# Patient Record
Sex: Male | Born: 1945 | Race: White | Hispanic: No | Marital: Married | Smoking: Never smoker
Health system: Southern US, Community
[De-identification: ages and names within clinical notes are randomized; demographics above are authoritative.]

## PROBLEM LIST (undated history)

## (undated) DIAGNOSIS — L570 Actinic keratosis: Secondary | ICD-10-CM

## (undated) DIAGNOSIS — E785 Hyperlipidemia, unspecified: Secondary | ICD-10-CM

## (undated) DIAGNOSIS — I219 Acute myocardial infarction, unspecified: Secondary | ICD-10-CM

## (undated) DIAGNOSIS — N400 Enlarged prostate without lower urinary tract symptoms: Secondary | ICD-10-CM

## (undated) DIAGNOSIS — G4730 Sleep apnea, unspecified: Secondary | ICD-10-CM

## (undated) DIAGNOSIS — L309 Dermatitis, unspecified: Secondary | ICD-10-CM

## (undated) DIAGNOSIS — I4891 Unspecified atrial fibrillation: Secondary | ICD-10-CM

## (undated) DIAGNOSIS — K429 Umbilical hernia without obstruction or gangrene: Secondary | ICD-10-CM

## (undated) DIAGNOSIS — I2692 Saddle embolus of pulmonary artery without acute cor pulmonale: Secondary | ICD-10-CM

## (undated) DIAGNOSIS — I2510 Atherosclerotic heart disease of native coronary artery without angina pectoris: Secondary | ICD-10-CM

## (undated) HISTORY — DX: Actinic keratosis: L57.0

## (undated) HISTORY — PX: APPENDECTOMY: SHX54

## (undated) HISTORY — PX: CHOLECYSTECTOMY: SHX55

## (undated) HISTORY — DX: Sleep apnea, unspecified: G47.30

## (undated) HISTORY — DX: Atherosclerotic heart disease of native coronary artery without angina pectoris: I25.10

## (undated) HISTORY — DX: Hyperlipidemia, unspecified: E78.5

---

## 2000-07-18 HISTORY — PX: CORONARY ARTERY BYPASS GRAFT: SHX141

## 2005-06-13 HISTORY — PX: CARDIAC CATHETERIZATION: SHX172

## 2009-05-27 ENCOUNTER — Ambulatory Visit (HOSPITAL_COMMUNITY): Admission: RE | Admit: 2009-05-27 | Discharge: 2009-05-27 | Payer: Self-pay | Admitting: Family Medicine

## 2009-11-28 ENCOUNTER — Emergency Department (HOSPITAL_COMMUNITY): Admission: EM | Admit: 2009-11-28 | Discharge: 2009-11-29 | Payer: Self-pay | Admitting: Emergency Medicine

## 2010-03-16 HISTORY — PX: NM MYOVIEW LTD: HXRAD82

## 2010-03-16 HISTORY — PX: DOPPLER ECHOCARDIOGRAPHY: SHX263

## 2010-04-12 ENCOUNTER — Ambulatory Visit: Payer: Self-pay | Admitting: Internal Medicine

## 2010-05-31 ENCOUNTER — Ambulatory Visit: Payer: Self-pay | Admitting: Internal Medicine

## 2011-01-28 ENCOUNTER — Ambulatory Visit (INDEPENDENT_AMBULATORY_CARE_PROVIDER_SITE_OTHER): Payer: BC Managed Care – PPO | Admitting: Internal Medicine

## 2011-01-28 ENCOUNTER — Encounter: Payer: Self-pay | Admitting: Internal Medicine

## 2011-01-28 DIAGNOSIS — H6592 Unspecified nonsuppurative otitis media, left ear: Secondary | ICD-10-CM

## 2011-01-28 DIAGNOSIS — I2510 Atherosclerotic heart disease of native coronary artery without angina pectoris: Secondary | ICD-10-CM

## 2011-01-28 DIAGNOSIS — I10 Essential (primary) hypertension: Secondary | ICD-10-CM

## 2011-01-28 DIAGNOSIS — G4730 Sleep apnea, unspecified: Secondary | ICD-10-CM

## 2011-01-28 DIAGNOSIS — E785 Hyperlipidemia, unspecified: Secondary | ICD-10-CM

## 2011-01-28 DIAGNOSIS — J309 Allergic rhinitis, unspecified: Secondary | ICD-10-CM

## 2011-01-28 DIAGNOSIS — H6590 Unspecified nonsuppurative otitis media, unspecified ear: Secondary | ICD-10-CM

## 2011-01-28 NOTE — Progress Notes (Signed)
  Subjective:    Patient ID: Keith Phelps, male    DOB: [birth year], 65 y.o.   MRN: [id number]  HPI    Pleasant white male with history of coronary artery disease status post coronary artery bypass graft surgery X4 2002 followed by [facility]. History of hyperlipidemia hypertension and ischemic cardiomyopathy. Just saw cardiologist 01/19/2011 and was felt to be stable. History of sleep apnea and uses CPAP machine. His fiance is [name]. He is divorced. He is a retired [employer] fund[employer]. He retired in May 2011. Does not smoke or consume alcohol. Family history of stroke and MI in his father. History of cholecystectomy and appendectomy 1976. History of BPH for which he uses Flomax. Says he has developed some allergy symptoms since moving to [place]. Has chronic stuffy nose. Has used steroid nasal spray in the past but did not think it helped all that much.  Patient here today complaining of left ear discomfort onset yesterday. Says he has slight discomfort in left roof of mouth when swallowing with pain shooting up to his right ear.     Review of Systems     Objective:   Physical Exam has fullness in left TM. It does not red. Pharynx is clear. Neck is supple; chest is clear; he has boggy nasal mucosa; has cerumen right external ear canal. His voice is slightly raspy and has been that way for some time he says.        Assessment & Plan:  Left serous otitis media  Allergic rhinitis  Hypertension  Hyperlipidemia  Coronary artery disease  Sleep apnea  Plan he may take Sudafed PE for 7 days. Zithromax Z-PAK 2 tabs by mouth day one followed by 1 tab days 2 through 5. May need to take Zyrtec on a daily basis for allergy symptoms. He would like to see ENT physician to have his vocal cords checked and have his hearing checked. I will also need to remove cerumen from his right ear. Appointment made Dr. Haroldine Laws Friday, 02/18/2011.

## 2011-01-28 NOTE — Patient Instructions (Signed)
Takes Sudafed PE for 7 days then switch to Zyrtec 10 mg at bedtime. Zithromax Z-PAK as been prescribed for otitis media appointment made for you to see ENT physician 02/18/2011

## 2011-05-02 ENCOUNTER — Telehealth: Payer: Self-pay

## 2011-05-02 NOTE — Telephone Encounter (Signed)
Pt should contact provider of his C-Pap equipment for an assessment. Perhaps [facility] provided it?

## 2011-05-06 ENCOUNTER — Telehealth: Payer: Self-pay

## 2011-05-06 NOTE — Telephone Encounter (Signed)
New order sent to [facility] for Auto CPap Machine for 90 day trial period,  Signed by Dr. Lenord Fellers. Patient informed

## 2011-05-20 ENCOUNTER — Telehealth: Payer: Self-pay

## 2011-05-20 NOTE — Telephone Encounter (Signed)
Order faxed to [facility] for CPap setting of 10 cm of H20, per auto titration report

## 2011-07-04 ENCOUNTER — Encounter: Payer: Self-pay | Admitting: Internal Medicine

## 2011-08-23 DIAGNOSIS — G4730 Sleep apnea, unspecified: Secondary | ICD-10-CM

## 2012-01-03 ENCOUNTER — Ambulatory Visit (INDEPENDENT_AMBULATORY_CARE_PROVIDER_SITE_OTHER): Payer: Medicare Other | Admitting: Internal Medicine

## 2012-01-03 ENCOUNTER — Encounter: Payer: Self-pay | Admitting: Internal Medicine

## 2012-01-03 VITALS — BP 126/64 | HR 64 | Temp 98.4°F | Ht 71.0 in | Wt 201.0 lb

## 2012-01-03 DIAGNOSIS — I2510 Atherosclerotic heart disease of native coronary artery without angina pectoris: Secondary | ICD-10-CM

## 2012-01-03 DIAGNOSIS — E785 Hyperlipidemia, unspecified: Secondary | ICD-10-CM

## 2012-01-03 DIAGNOSIS — G4730 Sleep apnea, unspecified: Secondary | ICD-10-CM

## 2012-01-03 DIAGNOSIS — R972 Elevated prostate specific antigen [PSA]: Secondary | ICD-10-CM

## 2012-01-03 DIAGNOSIS — I10 Essential (primary) hypertension: Secondary | ICD-10-CM

## 2012-01-03 NOTE — Patient Instructions (Addendum)
Continue same medications as well as CPAP machine at 10 cm of water. Return in one year.

## 2012-01-03 NOTE — Progress Notes (Signed)
  Subjective:    Patient ID: Keith Phelps, male    DOB: 01/08/1946, 66 y.o.   MRN: [id number]  HPI 66 year old white male seen here infrequently. He has a history of sleep apnea and had sleep study done in [place] in 2010. He recently brought that report in. He uses 10 cm of water CPAP. He has been dealing with [facility] in [place] and even but wants to change to [facility] here in [place]. He is now living here in [place] with his fiance Keith Phelps. He is also under the care of urologist for elevated PSA and cardiologist as well. Does say that Dr. Rennis Golden, cardiologist checks lab work on him frequently. He declines to get influenza immunizations. He is not overweight. History of hypertension. History of coronary artery bypass surgery x4 in 2002. Cholecystectomy and appendectomy 1976. No known drug allergies. Is on Lipitor for hyperlipidemia. Takes Flomax daily. Also is on Trilipix and ramipril as well as Niaspan.. Had colonoscopy July 2010 by Dr. Tish Men in [place] showing internal hemorrhoids. Repeat colonoscopy recommended in 10 years. No polyps noted. Declines influenza immunization.    Review of Systems     Objective:   Physical Exam he is alert and oriented x3. Pulse oximetry on room air is 95%. Weight is stable. HEENT exam: TMs and pharynx are clear. Neck is supple without JVD thyromegaly or carotid bruits. Chest is clear to auscultation. Cardiac exam regular rate and rhythm normal S1 and S2 without murmurs gallops or rubs; extremities without lower extremity edema.        Assessment & Plan:  Sleep apnea  Elevated PSA  Hypertension  Plan: New prescription written for sleep apnea machine 10 cm of water and CPAP mask filters. This should be good for one year. It was faxed to [facility] today along with his sleep study from 2010. We checked her today about how often he should be seen in our like to see him once yearly.

## 2012-02-21 HISTORY — PX: DOPPLER ECHOCARDIOGRAPHY: SHX263

## 2012-02-23 ENCOUNTER — Ambulatory Visit
Admission: RE | Admit: 2012-02-23 | Discharge: 2012-02-23 | Disposition: A | Discharge status: Home / Self Care | Payer: Medicare Other | Source: Ambulatory Visit | Attending: Internal Medicine | Admitting: Internal Medicine

## 2012-02-23 ENCOUNTER — Ambulatory Visit (INDEPENDENT_AMBULATORY_CARE_PROVIDER_SITE_OTHER): Payer: Medicare Other | Admitting: Internal Medicine

## 2012-02-23 ENCOUNTER — Encounter: Payer: Self-pay | Admitting: Internal Medicine

## 2012-02-23 VITALS — BP 128/68 | HR 80 | Temp 98.4°F | Wt 204.0 lb

## 2012-02-23 DIAGNOSIS — N400 Enlarged prostate without lower urinary tract symptoms: Secondary | ICD-10-CM

## 2012-02-23 DIAGNOSIS — K432 Incisional hernia without obstruction or gangrene: Secondary | ICD-10-CM

## 2012-02-23 DIAGNOSIS — I2510 Atherosclerotic heart disease of native coronary artery without angina pectoris: Secondary | ICD-10-CM

## 2012-02-23 DIAGNOSIS — R0781 Pleurodynia: Secondary | ICD-10-CM

## 2012-02-23 DIAGNOSIS — R079 Chest pain, unspecified: Secondary | ICD-10-CM

## 2012-02-23 DIAGNOSIS — R071 Chest pain on breathing: Secondary | ICD-10-CM

## 2012-02-23 DIAGNOSIS — R0789 Other chest pain: Secondary | ICD-10-CM

## 2012-02-23 IMAGING — CR DG CHEST 2V
2 series · 2 of 2 positions shown · non-contrast
Comparison: [DATE]

CLINICAL DATA: Lifting injury.  Felt a pop on the left ribs.

CHEST - 2 VIEW

[view not recorded (1 of 2)]
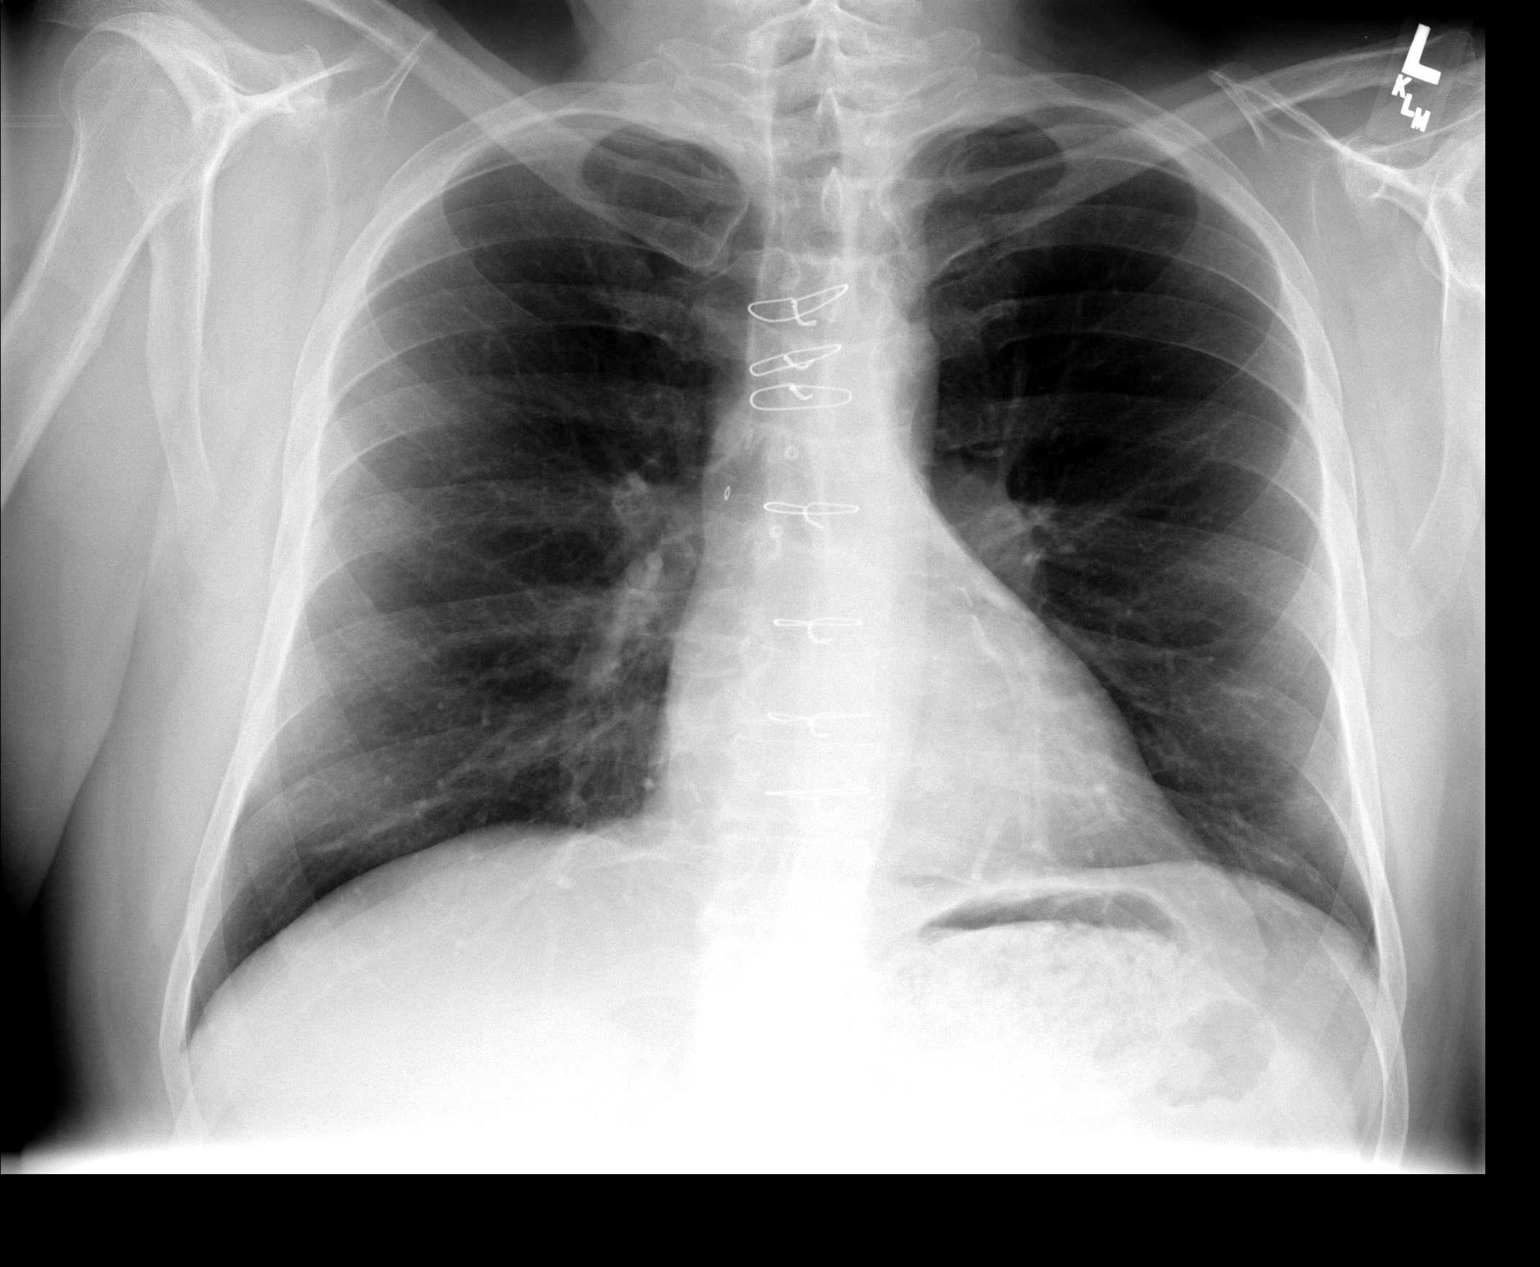

[view not recorded (2 of 2)]
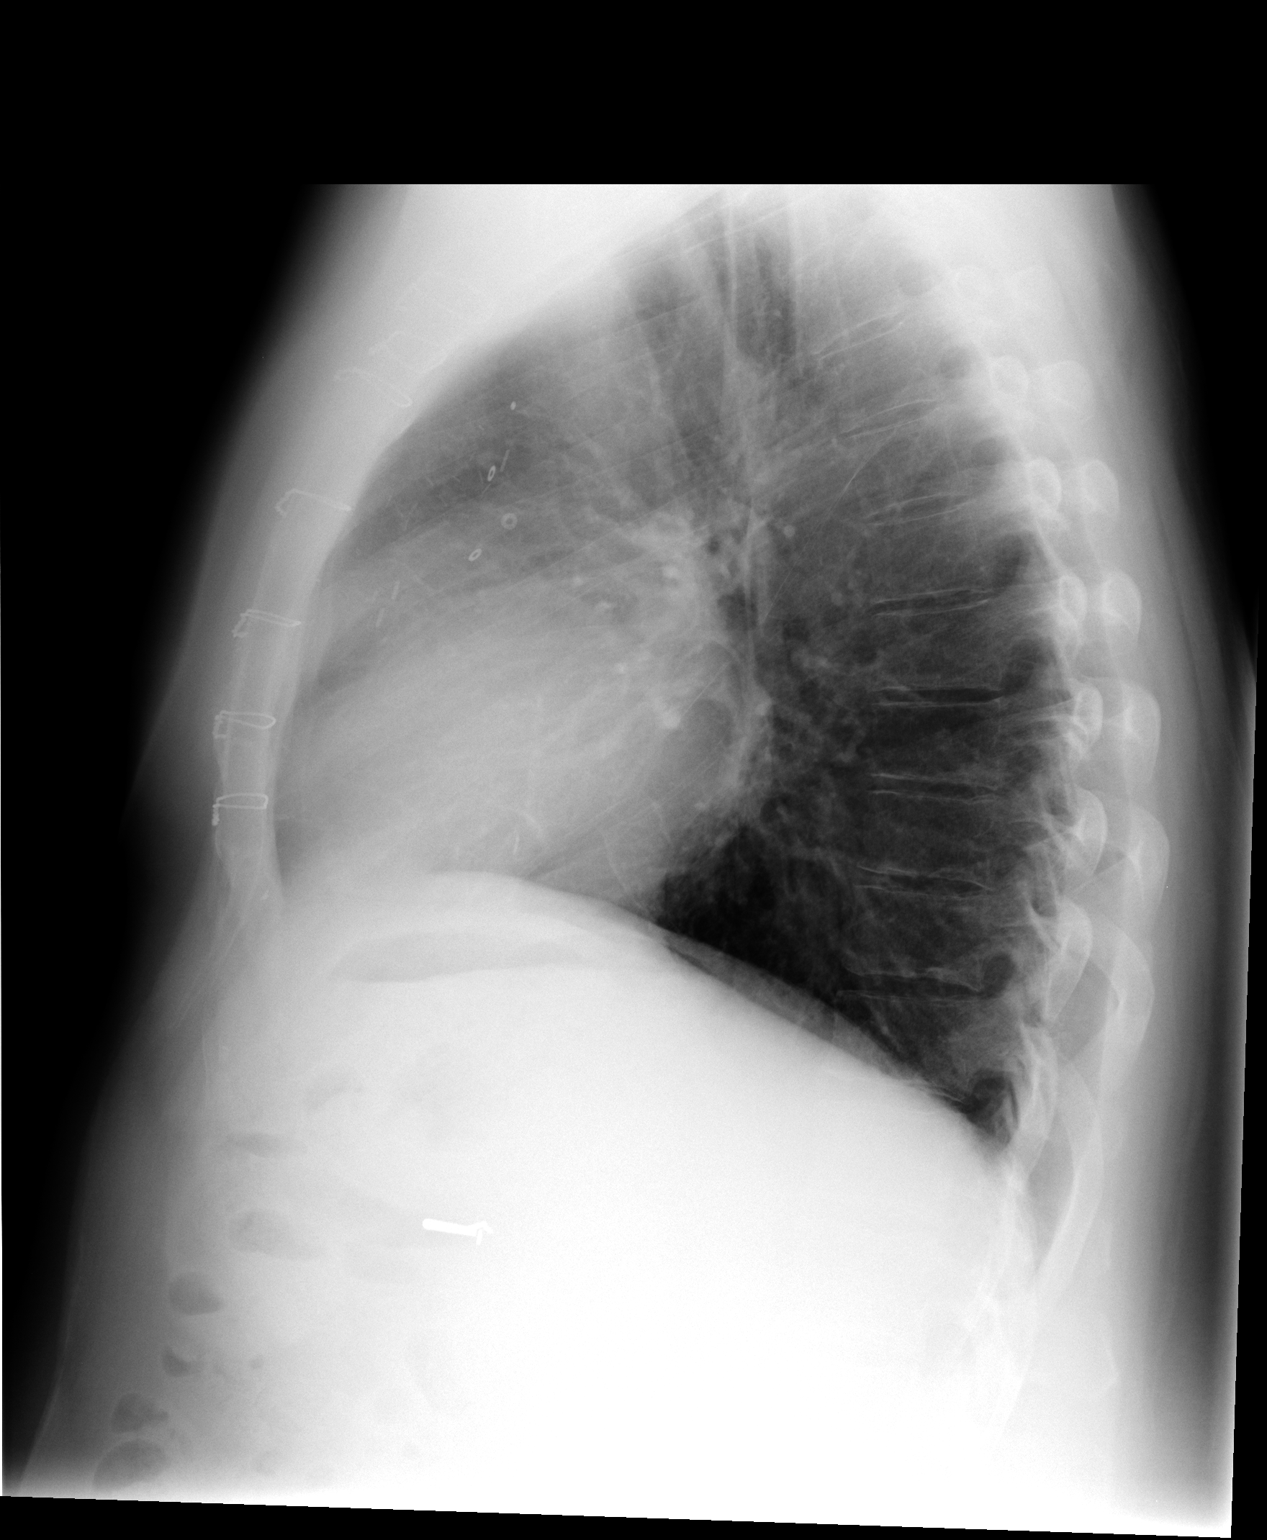

[2 of 2 positions shown; findings below may reference images not displayed]

FINDINGS: Prior CABG. Heart and mediastinal contours are within
normal limits.  No focal opacities or effusions.  No acute bony
abnormality.
IMPRESSION: No active cardiopulmonary disease.

## 2012-02-23 MED ORDER — CHOLINE FENOFIBRATE 135 MG PO CPDR: 135.0000 mg | DELAYED_RELEASE_CAPSULE | Freq: Every day | ORAL | Status: DC

## 2012-02-23 NOTE — Patient Instructions (Addendum)
Takes Celebrex 200 mg daily for 10 days. Apply ice to rib cage area. Please go obtain rib detail films today

## 2012-02-23 NOTE — Progress Notes (Addendum)
  Subjective:    Patient ID: Keith Phelps, male    DOB: 08/31/1945, 66 y.o.   MRN: [id number]  HPI 66 year old white male with history of coronary disease followed by [facility]. Recently had fasting lipid panel done through that office which was entirely normal. He is asking about kidney and liver functions. We did not do lab work when we saw him in June because he was here for sleep apnea issues. He should contact cardiologist to see if these labs were done. I do not have a copy of the lab report merely a copy of Dr. Blanchie Dessert note with lipid panel results.  Patient says that on July 30 picked up his fiance's heavy suitcase weighing some 60 pounds. Since that time he's had some left chest wall pain. He was concerned it might be related to an incisional hernia he has just above umbilicus. No shortness of breath. No substernal chest pain. Pain has persisted despite it being over a month since he picked up a suitcase. 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    Review of Systems     Objective:   Physical Exam small incisional hernia just above umbilicus. It is reducible. Chest is clear to auscultation. He has tenderness in the left lower parasternal area. He also is tender along the lower left mid rib cage area. Cardiac exam regular rate and rhythm normal S1 and S2. Extremities without edema.        Assessment & Plan:  Coronary artery disease  Hyperlipidemia  History of sleep apnea  Chest wall pain  Small incisional hernia above umbilicus  Plan: He will have rib detail films. Samples of Celebrex 200 mg daily for 10 days. Apply ice to rib cage area.   Addendum: Chest x-ray is normal. Rib detail films are normal.

## 2012-04-10 DIAGNOSIS — E509 Vitamin A deficiency, unspecified: Secondary | ICD-10-CM | POA: Insufficient documentation

## 2012-05-09 ENCOUNTER — Other Ambulatory Visit: Payer: Self-pay

## 2012-05-09 MED ORDER — CHOLINE FENOFIBRATE 135 MG PO CPDR: 135.0000 mg | DELAYED_RELEASE_CAPSULE | Freq: Every day | ORAL | Status: DC

## 2012-07-23 ENCOUNTER — Ambulatory Visit (INDEPENDENT_AMBULATORY_CARE_PROVIDER_SITE_OTHER): Payer: Medicare Other | Admitting: Internal Medicine

## 2012-07-23 ENCOUNTER — Encounter: Payer: Self-pay | Admitting: Internal Medicine

## 2012-07-23 VITALS — BP 100/56 | HR 76 | Temp 97.6°F | Wt 210.0 lb

## 2012-07-23 DIAGNOSIS — M25511 Pain in right shoulder: Secondary | ICD-10-CM | POA: Insufficient documentation

## 2012-07-23 DIAGNOSIS — M25519 Pain in unspecified shoulder: Secondary | ICD-10-CM

## 2012-07-23 NOTE — Progress Notes (Signed)
  Subjective:    Patient ID: Keith Phelps, male    DOB: Nov 03, 1945, 67 y.o.   MRN: [id number]  HPI  67 year old white male with history of sleep apnea, coronary artery disease, hypertension and hyperlipidemia in today with complaint of right shoulder pain. Says right shoulder has actually been a problem for a number of years. Sometimes it pops but it has never been really painful until recently. Painful to bring right upper extremity in front of him. No issues with abduction of right upper extremity. He noticed it particularly when he went to throw a branch in [place] that he had extreme pain in the top of his right shoulder. No weakness and no significant radiculopathy.   Review of Systems     Objective:   Physical Exam he has a prominent tender right a.c. joint. Able to abduct right upper extremity without any issues unable to bring right upper extremity behind TM without issues. Deep tendon reflexes 2+ and symmetrical and muscle strength in the right upper extremity appears to be normal. He is able to shrug his shoulder and make the a.c. joint pop.        Assessment & Plan:  Right acromioclavicular joint inflammation  Probable right acromioclavicular impingement  Plan: Refer to orthopedist for further evaluation.

## 2012-07-23 NOTE — Patient Instructions (Addendum)
Referred to orthopedist 

## 2012-08-01 ENCOUNTER — Ambulatory Visit: Payer: Medicare Other | Attending: Specialist

## 2012-08-01 DIAGNOSIS — M25519 Pain in unspecified shoulder: Secondary | ICD-10-CM | POA: Insufficient documentation

## 2012-08-01 DIAGNOSIS — R5381 Other malaise: Secondary | ICD-10-CM | POA: Insufficient documentation

## 2012-08-01 DIAGNOSIS — M25619 Stiffness of unspecified shoulder, not elsewhere classified: Secondary | ICD-10-CM | POA: Insufficient documentation

## 2012-08-01 DIAGNOSIS — IMO0001 Reserved for inherently not codable concepts without codable children: Secondary | ICD-10-CM | POA: Insufficient documentation

## 2012-08-03 ENCOUNTER — Ambulatory Visit: Payer: Medicare Other | Admitting: Physical Therapy

## 2012-08-06 ENCOUNTER — Ambulatory Visit: Payer: Medicare Other | Admitting: Physical Therapy

## 2012-08-07 ENCOUNTER — Telehealth: Payer: Self-pay | Admitting: Internal Medicine

## 2012-08-07 NOTE — Telephone Encounter (Signed)
Patient called to say he been seen by urologist in [place] and had glucose in his urine. History of BPH and elevated PSA. Cardiologist has been monitoring his hyperlipidemia. Last glucose on file September 2011 was normal at 99. No prior history of diabetes mellitus. History of coronary artery bypass surgery times 07/14/2000. Patient needs appointment for fasting C. met and hemoglobin A1c as well as urinalysis with office visit

## 2012-08-09 ENCOUNTER — Ambulatory Visit: Payer: Medicare Other

## 2012-08-14 ENCOUNTER — Ambulatory Visit: Payer: Medicare Other | Attending: Specialist

## 2012-08-14 DIAGNOSIS — R5381 Other malaise: Secondary | ICD-10-CM | POA: Insufficient documentation

## 2012-08-14 DIAGNOSIS — M25519 Pain in unspecified shoulder: Secondary | ICD-10-CM | POA: Insufficient documentation

## 2012-08-14 DIAGNOSIS — M25619 Stiffness of unspecified shoulder, not elsewhere classified: Secondary | ICD-10-CM | POA: Insufficient documentation

## 2012-08-14 DIAGNOSIS — IMO0001 Reserved for inherently not codable concepts without codable children: Secondary | ICD-10-CM | POA: Insufficient documentation

## 2012-08-17 ENCOUNTER — Ambulatory Visit: Payer: Medicare Other | Admitting: Physical Therapy

## 2012-08-21 ENCOUNTER — Ambulatory Visit: Payer: Medicare Other

## 2012-08-24 ENCOUNTER — Encounter: Payer: Medicare Other | Admitting: Physical Therapy

## 2012-08-27 ENCOUNTER — Other Ambulatory Visit: Payer: Medicare Other | Admitting: Internal Medicine

## 2012-08-27 ENCOUNTER — Other Ambulatory Visit: Payer: Self-pay | Admitting: Internal Medicine

## 2012-08-27 DIAGNOSIS — R7301 Impaired fasting glucose: Secondary | ICD-10-CM

## 2012-08-27 LAB — COMPREHENSIVE METABOLIC PANEL
ALT: 35 U/L (ref 0–53)
AST: 24 U/L (ref 0–37)
BUN: 9 mg/dL (ref 6–23)
Chloride: 104 mEq/L (ref 96–112)
Creat: 0.97 mg/dL (ref 0.50–1.35)
Potassium: 4.4 mEq/L (ref 3.5–5.3)
Total Bilirubin: 0.8 mg/dL (ref 0.3–1.2)

## 2012-08-27 LAB — HEMOGLOBIN A1C: Hgb A1c MFr Bld: 6.6 % — ABNORMAL HIGH (ref <5.7)

## 2012-08-28 ENCOUNTER — Ambulatory Visit: Payer: Medicare Other

## 2012-08-28 LAB — URINALYSIS, ROUTINE W REFLEX MICROSCOPIC
Glucose, UA: NEGATIVE mg/dL
Hgb urine dipstick: NEGATIVE
Leukocytes, UA: NEGATIVE
Nitrite: NEGATIVE
Protein, ur: NEGATIVE mg/dL
Specific Gravity, Urine: 1.015 (ref 1.005–1.030)
pH: 7 (ref 5.0–8.0)

## 2012-08-30 ENCOUNTER — Ambulatory Visit (INDEPENDENT_AMBULATORY_CARE_PROVIDER_SITE_OTHER): Payer: Medicare Other | Admitting: Internal Medicine

## 2012-08-30 ENCOUNTER — Encounter: Payer: Self-pay | Admitting: Internal Medicine

## 2012-08-30 VITALS — BP 108/74 | HR 80 | Temp 97.9°F | Wt 203.0 lb

## 2012-08-30 DIAGNOSIS — R7303 Prediabetes: Secondary | ICD-10-CM

## 2012-08-30 DIAGNOSIS — Z23 Encounter for immunization: Secondary | ICD-10-CM

## 2012-08-30 DIAGNOSIS — E785 Hyperlipidemia, unspecified: Secondary | ICD-10-CM

## 2012-08-30 DIAGNOSIS — G4733 Obstructive sleep apnea (adult) (pediatric): Secondary | ICD-10-CM

## 2012-08-30 DIAGNOSIS — E119 Type 2 diabetes mellitus without complications: Secondary | ICD-10-CM

## 2012-08-30 DIAGNOSIS — R7309 Other abnormal glucose: Secondary | ICD-10-CM

## 2012-08-30 DIAGNOSIS — I2510 Atherosclerotic heart disease of native coronary artery without angina pectoris: Secondary | ICD-10-CM

## 2012-08-30 MED ORDER — PNEUMOCOCCAL VAC POLYVALENT 25 MCG/0.5ML IJ INJ: 0.5000 mL | INJECTION | INTRAMUSCULAR | Status: DC

## 2012-08-30 NOTE — Patient Instructions (Addendum)
Monitor glucose once daily. Pneumovax immunization given. Stay on 1800-calorie ADA diet. Stop drinking Dr. Reino Kent. He may have diet soda. Return in 3 months.

## 2012-08-30 NOTE — Progress Notes (Signed)
  Subjective:    Patient ID: Keith Phelps, male    DOB: 1946-06-18, 67 y.o.   MRN: [id number]  HPI Has been diagnosed with new onset diabetes. Needs diabetic eye exam. Reviewed labs today . Reviewed dietary restrictions. Has been drinking a lot of Dr. Reino Kent. 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 He has Hgb AIC of 6.6% and fasting serum glucose of 124. He has coronary disease and is on statin therapy. Hx of sleep apnea.    Review of Systems     Objective:   Physical Exam bilateral lenticular densities. Fundi not well seen. TMs and pharynx are clear. Neck is supple without JVD thyromegaly or carotid bruits. Chest clear to auscultation. Cardiac exam regular rate and rhythm normal S1 and S2. Abdomen no hepatosplenomegaly masses or tenderness. Prostate exam deferred to urologist. Pulses are normal and ankles but absent in feet. No diabetic foot ulcers or calluses.        Assessment & Plan:  Cataracts bilateral  New onset diabetes Type 2  Coronary artery disease  Hyperlipidemia  Sleep apnea  Plan: Patient will be placed on an 1800-calorie ADA diet. Prescription for glucometer and diabetic test strips to monitor  glucose once daily. Counseled regarding diabetic diet. Return in 3 months for office visit hemoglobin A1c. Defer physical examination which was scheduled in March. Urine for microalbumin sent. Pneumovax immunization given.  30 minutes spent with patient.

## 2012-08-31 ENCOUNTER — Encounter: Payer: Self-pay | Admitting: Internal Medicine

## 2012-08-31 ENCOUNTER — Ambulatory Visit: Payer: Medicare Other | Admitting: Physical Therapy

## 2012-08-31 LAB — MICROALBUMIN, URINE: Microalb, Ur: 0.5 mg/dL (ref 0.00–1.89)

## 2012-09-04 ENCOUNTER — Ambulatory Visit: Payer: Medicare Other

## 2012-09-07 ENCOUNTER — Ambulatory Visit: Payer: Medicare Other | Admitting: Physical Therapy

## 2012-09-10 ENCOUNTER — Ambulatory Visit: Payer: Medicare Other | Attending: Specialist

## 2012-09-10 DIAGNOSIS — M25619 Stiffness of unspecified shoulder, not elsewhere classified: Secondary | ICD-10-CM | POA: Insufficient documentation

## 2012-09-10 DIAGNOSIS — M25519 Pain in unspecified shoulder: Secondary | ICD-10-CM | POA: Insufficient documentation

## 2012-09-10 DIAGNOSIS — IMO0001 Reserved for inherently not codable concepts without codable children: Secondary | ICD-10-CM | POA: Insufficient documentation

## 2012-09-10 DIAGNOSIS — R5381 Other malaise: Secondary | ICD-10-CM | POA: Insufficient documentation

## 2012-09-13 ENCOUNTER — Ambulatory Visit: Payer: Medicare Other | Admitting: Physical Therapy

## 2012-09-17 ENCOUNTER — Ambulatory Visit: Payer: Medicare Other

## 2012-09-20 ENCOUNTER — Ambulatory Visit: Payer: Medicare Other

## 2012-10-02 ENCOUNTER — Other Ambulatory Visit: Payer: Medicare Other | Admitting: Internal Medicine

## 2012-10-04 ENCOUNTER — Encounter: Payer: Medicare Other | Admitting: Internal Medicine

## 2012-11-27 ENCOUNTER — Other Ambulatory Visit: Payer: Self-pay | Admitting: Internal Medicine

## 2012-11-27 ENCOUNTER — Telehealth: Payer: Self-pay | Admitting: *Deleted

## 2012-11-27 MED ORDER — CHOLINE FENOFIBRATE 135 MG PO CPDR: 135.0000 mg | DELAYED_RELEASE_CAPSULE | Freq: Every day | ORAL | Status: DC

## 2012-11-27 MED ORDER — NIACIN ER (ANTIHYPERLIPIDEMIC) 1000 MG PO TBCR: 1000.0000 mg | EXTENDED_RELEASE_TABLET | Freq: Every day | ORAL | Status: DC

## 2012-11-29 ENCOUNTER — Other Ambulatory Visit: Payer: Medicare Other | Admitting: Internal Medicine

## 2012-11-29 DIAGNOSIS — E119 Type 2 diabetes mellitus without complications: Secondary | ICD-10-CM

## 2012-11-29 LAB — HEMOGLOBIN A1C
Hgb A1c MFr Bld: 6.1 % — ABNORMAL HIGH (ref <5.7)
Mean Plasma Glucose: 128 mg/dL — ABNORMAL HIGH (ref <117)

## 2012-12-04 ENCOUNTER — Encounter: Payer: Self-pay | Admitting: Internal Medicine

## 2012-12-04 ENCOUNTER — Ambulatory Visit (INDEPENDENT_AMBULATORY_CARE_PROVIDER_SITE_OTHER): Payer: Medicare Other | Admitting: Internal Medicine

## 2012-12-04 VITALS — BP 100/56 | HR 64 | Temp 98.1°F | Wt 198.0 lb

## 2012-12-04 DIAGNOSIS — R7309 Other abnormal glucose: Secondary | ICD-10-CM

## 2012-12-04 DIAGNOSIS — M25519 Pain in unspecified shoulder: Secondary | ICD-10-CM

## 2012-12-04 DIAGNOSIS — R7302 Impaired glucose tolerance (oral): Secondary | ICD-10-CM | POA: Insufficient documentation

## 2012-12-04 DIAGNOSIS — M25511 Pain in right shoulder: Secondary | ICD-10-CM

## 2012-12-04 DIAGNOSIS — G4733 Obstructive sleep apnea (adult) (pediatric): Secondary | ICD-10-CM

## 2012-12-04 NOTE — Patient Instructions (Addendum)
Continue diet exercise and weight loss. Return in 6 months for physical exam. Have MRI of right shoulder to evaluate for arthropathy. Increase dietary intake to 2000 calories daily. Continue to use CPAP apparatus. Prescription given for Zostavax vaccine.

## 2012-12-04 NOTE — Progress Notes (Signed)
  Subjective:    Patient ID: Keith Phelps, male    DOB: 24-Jul-1945, 67 y.o.   MRN: [id number]  HPI  67 year old white male with history of hyperlipidemia, hypertension, and coronary artery disease in today for followup on impaired glucose tolerance. At last visit hemoglobin A1c was 6.6%. He has been working diligently with diet and exercise and now hemoglobin A1c has improved to 6.1%. In January 2014 he weighed 210 pounds. He is now down to 198 pounds. He's been able to walk 3.6 miles daily in just over one hour. He gets hungry and tired and would like to increase his caloric intake to 2000 calories daily. I think this is reasonable. His been following a strict 1800-calorie diet now for about 3 months. He's cut out a good deal of sugar in his diet. He is exercising very regularly.  He has a history of sleep apnea and has a CPAP apparatus. His been waking up at 4:30 in the morning. Usually would awaken around 5:30. He feels rested he just not sure why it's waking up an hour earlier and he ordinarily would like to arise. Apparently he is someone who has never required much sleep in the past either. Do not think he needs to be medicated at this point in time.  He also has a history of right shoulder pain and will be getting an MRI in the next few days 8 g per orthopedics. He went through considerable amount of physical therapy to increase his range of motion of his right shoulder however he still having pain and wants to find out exactly what is wrong. He says orthopedist suspects he has a tear in his right shoulder.    Review of Systems     Objective:   Physical Exam good range of motion right upper extremity. Chest clear to auscultation. Cardiac exam regular rate and rhythm normal S1 and S2 without murmurs or gallops. Extremities without edema. Diabetic foot exam without ulcers or calluses.        Assessment & Plan:  Impaired glucose tolerance-improved with diet and  exercise  Hypertension-stable  Hyperlipidemia-stable on statin medication  History of sleep apnea  Right shoulder pain-? Torn rotator cuff  Plan: Schedule physical exam in 6 months. Continue to work on diet and exercise. I am not placing him on any glucose lowering medication at this point in time.  Prescription given for Zostavax vaccine to be obtained at pharmacy. He did have colonoscopy done in [place], [place] January 26, 2009 but that physician  subsequently retired. Patient says he was told to return in 10 years

## 2013-01-24 ENCOUNTER — Other Ambulatory Visit: Payer: Self-pay | Admitting: Internal Medicine

## 2013-01-25 ENCOUNTER — Telehealth: Payer: Self-pay | Admitting: Internal Medicine

## 2013-01-25 DIAGNOSIS — E785 Hyperlipidemia, unspecified: Secondary | ICD-10-CM

## 2013-01-25 NOTE — Telephone Encounter (Signed)
Message forwarded to K. Alvstad, PharmD.  

## 2013-01-25 NOTE — Telephone Encounter (Signed)
Takes Niacian and ant to know with the recent findings about the niacian should he continue to take it .Marland Kitchen Please call and can leave message on voicemail  Thanks

## 2013-01-25 NOTE — Telephone Encounter (Signed)
Rx was sent to pharmacy electronically. 

## 2013-01-25 NOTE — Telephone Encounter (Signed)
Returned call.  Male answering stated pt just went out.  Left message for pt to call back today before 4pm.

## 2013-01-25 NOTE — Telephone Encounter (Signed)
Spoke with patient - has dual concern with Niaspan - first the new information stating may not be any added benefit and second, he is in the "donut hole", med now costing >$300/month.    Reviewed chart - pt has been on diet/exercise regimen recently after diagnosis of DM.  Has lost weight and walking 4+ miles/day.  Very cautious about what he eats, has not needed any meds and blood sugars are improved.  Encouraged patient to continue with diet and exercise plan.  Stop Niaspan when current supply runs out then go for repeat lipid panel 2 months after last dose.  Pt voiced understanding.

## 2013-01-25 NOTE — Telephone Encounter (Signed)
Thanks.  Sounds perfect.  -[place]

## 2013-03-21 ENCOUNTER — Other Ambulatory Visit: Payer: Self-pay | Admitting: Internal Medicine

## 2013-03-25 NOTE — Telephone Encounter (Signed)
Rx was sent to pharmacy electronically. 

## 2013-05-16 DIAGNOSIS — Z131 Encounter for screening for diabetes mellitus: Secondary | ICD-10-CM | POA: Insufficient documentation

## 2013-05-17 ENCOUNTER — Other Ambulatory Visit: Payer: Medicare Other | Admitting: Internal Medicine

## 2013-05-17 DIAGNOSIS — N401 Enlarged prostate with lower urinary tract symptoms: Secondary | ICD-10-CM

## 2013-05-17 DIAGNOSIS — E785 Hyperlipidemia, unspecified: Secondary | ICD-10-CM

## 2013-05-17 DIAGNOSIS — Z130 Encounter for screening for diseases of the blood and blood-forming organs and certain disorders involving the immune mechanism: Secondary | ICD-10-CM

## 2013-05-17 DIAGNOSIS — Z0000 Encounter for general adult medical examination without abnormal findings: Secondary | ICD-10-CM

## 2013-05-17 DIAGNOSIS — R7302 Impaired glucose tolerance (oral): Secondary | ICD-10-CM

## 2013-05-17 LAB — LIPID PANEL
HDL: 38 mg/dL — ABNORMAL LOW (ref >39)
LDL Cholesterol: 64 mg/dL (ref 0–99)
Triglycerides: 51 mg/dL (ref <150)

## 2013-05-17 LAB — CBC WITH DIFFERENTIAL/PLATELET
Eosinophils Absolute: 0.2 10*3/uL (ref 0.0–0.7)
HCT: 39.7 % (ref 39.0–52.0)
Lymphocytes Relative: 37 % (ref 12–46)
MCHC: 34.8 g/dL (ref 30.0–36.0)
MCV: 93 fL (ref 78.0–100.0)
Monocytes Absolute: 0.4 10*3/uL (ref 0.1–1.0)
Monocytes Relative: 8 % (ref 3–12)
Neutro Abs: 2.2 10*3/uL (ref 1.7–7.7)
Neutrophils Relative %: 49 % (ref 43–77)
RDW: 12.7 % (ref 11.5–15.5)
WBC: 4.4 10*3/uL (ref 4.0–10.5)

## 2013-05-17 LAB — COMPREHENSIVE METABOLIC PANEL
Calcium: 9.2 mg/dL (ref 8.4–10.5)
Glucose, Bld: 91 mg/dL (ref 70–99)
Potassium: 4.6 mEq/L (ref 3.5–5.3)
Sodium: 140 mEq/L (ref 135–145)
Total Bilirubin: 0.5 mg/dL (ref 0.3–1.2)
Total Protein: 6.4 g/dL (ref 6.0–8.3)

## 2013-05-23 ENCOUNTER — Encounter: Payer: Self-pay | Admitting: Internal Medicine

## 2013-05-23 ENCOUNTER — Ambulatory Visit (INDEPENDENT_AMBULATORY_CARE_PROVIDER_SITE_OTHER): Payer: Medicare Other | Admitting: Internal Medicine

## 2013-05-23 VITALS — BP 104/62 | HR 60 | Temp 97.8°F | Ht 70.5 in | Wt 195.0 lb

## 2013-05-23 DIAGNOSIS — Z0000 Encounter for general adult medical examination without abnormal findings: Secondary | ICD-10-CM

## 2013-05-23 DIAGNOSIS — I10 Essential (primary) hypertension: Secondary | ICD-10-CM

## 2013-05-23 DIAGNOSIS — G4733 Obstructive sleep apnea (adult) (pediatric): Secondary | ICD-10-CM

## 2013-05-23 DIAGNOSIS — E785 Hyperlipidemia, unspecified: Secondary | ICD-10-CM

## 2013-05-23 DIAGNOSIS — R7302 Impaired glucose tolerance (oral): Secondary | ICD-10-CM

## 2013-05-23 DIAGNOSIS — N400 Enlarged prostate without lower urinary tract symptoms: Secondary | ICD-10-CM

## 2013-05-23 DIAGNOSIS — Z951 Presence of aortocoronary bypass graft: Secondary | ICD-10-CM

## 2013-05-23 DIAGNOSIS — I2510 Atherosclerotic heart disease of native coronary artery without angina pectoris: Secondary | ICD-10-CM

## 2013-05-23 DIAGNOSIS — J309 Allergic rhinitis, unspecified: Secondary | ICD-10-CM

## 2013-05-23 DIAGNOSIS — R7309 Other abnormal glucose: Secondary | ICD-10-CM

## 2013-05-23 LAB — POCT URINALYSIS DIPSTICK
Blood, UA: NEGATIVE
Leukocytes, UA: NEGATIVE
Nitrite, UA: NEGATIVE
Urobilinogen, UA: 0.2
pH, UA: 6

## 2013-05-23 NOTE — Progress Notes (Signed)
Subjective:    Patient ID: Keith Phelps, male    DOB: Mar 10, 1946, 67 y.o.   MRN: [id number]  HPI 67 year old male for health maintenance and evaluation of medical problems.  Had arthroscopic surgery by Dr. Thomasena Edis right shoulder Oct 7th. Is recovering well from that.  Has a history of sleep apnea. Had sleep study done in [place] in 2010. He uses 10 cm of water CPAP. He is under the care of urologist for elevated PSA. History of coronary artery bypass surgery x4 in 2002. Followed by Dr. Rennis Golden, cardiologist.  Cholecystectomy and appendectomy in 1976.  No known drug allergies.  Had colonoscopy July 2010 by Dr. Tish Men in [place] showing internal hemorrhoids. Repeat colonoscopy recommended in 10 years. No polyps noted.  Says he has developed allergy symptoms since moving to [place] and has some chronic nose stuffiness. He has tried steroid nasal spray in the past but did not think it helped much.  History of BPH for which he takes Flomax. History of elevated PSA followed by urologist.  Declines influenza immunization.  Urine for microalbumin was within normal limits February 2014. Hemoglobin A1c has basically normalized since discovery of glucose intolerance. He has developed better eating habits and has followed a diabetic diet.  Social history: He is divorced. Fiance is [name]. They reside together here in [place]. He does not smoke or consume alcohol. He is a retired [employer] fundraiser. One son in his early 55s who serves in the [employer].   Family history: History of stroke and MI in his father. Father died at age 35 from complications of stroke. Patient does not mention mother's family history.  Colonoscopy 01/26/2009  Tetanus immunization May 2011  Coronary artery bypass surgery was done in 2002 in [place]. He had a recurrent cardiac catheterization December 2006 prior to her moving to [place] showing a normal aortic root on aortic root angiography. Had  ejection fraction of about 55%. Left main was notable for 50-60% distal stenosis. LAD had mild nonobstructive disease. Left circumflex had diffuse 60-70% disease in proximal and midportion. Ramus intermedius contained mild to moderate obstructive disease. RCA had 70% distal lesion while distal marginal branch had 70-80% disease. Vein graft to ramus intermedius, OM, RCA were all patent. Leave a graft to LAD was also patent. No significant peripheral artery disease on right femoral angiography.  In 2009 he had abnormal myocardial perfusion scan demonstrating attenuation defect in the anterior region of the myocardium. This was considered a low risk scan. No prior study available for comparison.  Last sleep study was done April 2010 at in [facility]. All and. Patient had desat to 75%    Review of Systems  Constitutional: Negative.   HENT:       Some nasal stuffiness  Eyes: Negative.   Respiratory: Negative.   Cardiovascular: Negative for chest pain, palpitations and leg swelling.  Endocrine: Negative.   Genitourinary:       History of BPH  Allergic/Immunologic: Positive for environmental allergies.  Neurological: Negative.   Hematological: Negative.   Psychiatric/Behavioral: Negative.        Objective:   Physical Exam  Vitals reviewed. Constitutional: He is oriented to person, place, and time. He appears well-developed and well-nourished. No distress.  HENT:  Head: Normocephalic and atraumatic.  Right Ear: External ear normal.  Left Ear: External ear normal.  Mouth/Throat: Oropharynx is clear and moist. No oropharyngeal exudate.  Eyes: Conjunctivae and EOM are normal. Pupils are  equal, round, and reactive to light. Right eye exhibits no discharge. Left eye exhibits no discharge. No scleral icterus.  Neck: Neck supple. No JVD present. No thyromegaly present.  Cardiovascular: Normal rate, regular rhythm and normal heart sounds.   No murmur  heard. Pulmonary/Chest: Effort normal and breath sounds normal. He has no wheezes. He has no rales.  Abdominal: Soft. Bowel sounds are normal. He exhibits no distension and no mass. There is no tenderness. There is no rebound and no guarding.  Genitourinary:  Prostate enlarged without nodules  Musculoskeletal: He exhibits no edema.  Lymphadenopathy:    He has no cervical adenopathy.  Neurological: He is alert and oriented to person, place, and time. He has normal reflexes. No cranial nerve deficit. Coordination normal.  Skin: Skin is warm and dry. No rash noted. He is not diaphoretic.  Psychiatric: He has a normal mood and affect. His behavior is normal. Judgment and thought content normal.          Assessment & Plan:  Recent history arthroscopic surgery right shoulder  Coronary artery disease followed by cardiologist  Hyperlipidemia-stable on statin medication  Sleep apnea-stable with CPAP  Hypertension-stable on current regimen  History of allergic rhinitis  Plan: Return in 6 months for office visit, lipid panel, liver functions. Discussion with him about CPAP supplies and what supplies are reasonable and how often these supplies are generally ordered. He's not happy with [facility] and says he will be looking for a new CPAP supply distributor.  Subjective:   Patient presents for Medicare Annual/Subsequent preventive examination.   Review Past Medical/Family/Social: see above   Risk Factors  Current exercise habits: walk 4.5 miles 5 days a week Dietary issues discussed: low fat low carb  Cardiac risk factors:  Depression Screen  (Note: if answer to either of the following is "Yes", a more complete depression screening is indicated)   Over the past two weeks, have you felt down, depressed or hopeless? No  Over the past two weeks, have you felt little interest or pleasure in doing things? No Have you lost interest or pleasure in daily life? No Do you  often feel hopeless? No Do you cry easily over simple problems? No   Activities of Daily Living  In your present state of health, do you have any difficulty performing the following activities?:   Driving? No  Managing money? No  Feeding yourself? No  Getting from bed to chair? No  Climbing a flight of stairs? No  Preparing food and eating?: No  Bathing or showering? No  Getting dressed: No  Getting to the toilet? No  Using the toilet:No  Moving around from place to place: No  In the past year have you fallen or had a near fall?:No  Are you sexually active? yes Do you have more than one partner? No   Hearing Difficulties: No  Do you often ask people to speak up or repeat themselves? No  Do you experience ringing or noises in your ears? No  Do you have difficulty understanding soft or whispered voices? No  Do you feel that you have a problem with memory? No Do you often misplace items? No    Home Safety:  Do you have a smoke alarm at your residence? Yes Do you have grab bars in the bathroom? no Do you have throw rugs in your house? Yes but have mats underneath   Cognitive Testing  Alert? Yes Normal Appearance?Yes  Oriented to person? Yes  Place? Yes  Time? Yes  Recall of three objects? Yes  Can perform simple calculations? Yes  Displays appropriate judgment?Yes  Can read the correct time from a watch face?Yes   List the Names of Other Physician/Practitioners you currently use:  See referral list for the physicians patient is currently seeing. Dr. Rennis Golden, cardiologist Orthopedist    Review of Systems: see above   Objective:     General appearance: Appears stated age and mildly obese  Head: Normocephalic, without obvious abnormality, atraumatic  Eyes: conj clear, EOMi PEERLA  Ears: normal TM's and external ear canals both ears  Nose: Nares normal. Septum midline. Mucosa normal. No drainage or sinus tenderness.  Throat: lips, mucosa, and tongue normal; teeth and  gums normal  Neck: no adenopathy, no carotid bruit, no JVD, supple, symmetrical, trachea midline and thyroid not enlarged, symmetric, no tenderness/mass/nodules  No CVA tenderness.  Lungs: clear to auscultation bilaterally  Breasts: normal appearance, no masses or tenderness  Heart: regular rate and rhythm, S1, S2 normal, no murmur, click, rub or gallop  Abdomen: soft, non-tender; bowel sounds normal; no masses, no organomegaly  Musculoskeletal: ROM normal in all joints, no crepitus, no deformity, Normal muscle strengthen. Back  is symmetric, no curvature. Skin: Skin color, texture, turgor normal. No rashes or lesions  Lymph nodes: Cervical, supraclavicular, and axillary nodes normal.  Neurologic: CN 2 -12 Normal, Normal symmetric reflexes. Normal coordination and gait  Psych: Alert & Oriented x 3, Mood appear stable.    Assessment:    Annual wellness medicare exam   Plan:    During the course of the visit the patient was educated and counseled about appropriate screening and preventive services including:  Annual PSA done by urologist in [place] Colonoscopy up to date Immunizations up to date but declines flu vaccine      Patient Instructions (the written plan) was given to the patient.  Medicare Attestation  I have personally reviewed:  The patient's medical and social history  Their use of alcohol, tobacco or illicit drugs  Their current medications and supplements  The patient's functional ability including ADLs,fall risks, home safety risks, cognitive, and hearing and visual impairment  Diet and physical activities  Evidence for depression or mood disorders  The patient's weight, height, BMI, and visual acuity have been recorded in the chart. I have made referrals, counseling, and provided education to the patient based on review of the above and I have provided the patient with a written personalized care plan for preventive services.

## 2013-05-23 NOTE — Patient Instructions (Addendum)
Return in one year. Continue same meds. Recommend continued diet exercise and weight loss.

## 2013-06-14 ENCOUNTER — Other Ambulatory Visit: Payer: Self-pay | Admitting: Internal Medicine

## 2013-06-14 NOTE — Telephone Encounter (Signed)
Rx was sent to pharmacy electronically. 

## 2013-07-05 ENCOUNTER — Encounter: Payer: Self-pay | Admitting: Internal Medicine

## 2013-07-05 DIAGNOSIS — Z951 Presence of aortocoronary bypass graft: Secondary | ICD-10-CM | POA: Insufficient documentation

## 2013-07-11 HISTORY — PX: OTHER SURGICAL HISTORY: SHX169

## 2013-07-26 ENCOUNTER — Telehealth: Payer: Self-pay | Admitting: Internal Medicine

## 2013-07-26 DIAGNOSIS — E782 Mixed hyperlipidemia: Secondary | ICD-10-CM

## 2013-07-26 NOTE — Telephone Encounter (Signed)
Returned call and pt verified x 2.  Pt stated he had labs done with his PCP in October (actually November) 2014.  Stated PCP told him that Dr. Debara Pickett is a part of the same system and can see results.  Pt informed that is true, but Dr. Debara Pickett would need to be notified to review the results in order to see them as they would have gone to Dr. Renold Genta.  Pt wants Dr. Debara Pickett to review lab results since stopping niacin.  Stated they look good and it really didn't have any affect on his values one way or the other.  Pt also wants to know if he needs to repeat labs.  Pt informed that he will likely need to repeat lipids and Dr. Debara Pickett will be notified to find out if other labs needed.  Pt verbalized understanding and agreed w/ plan.  Message forwarded to Dr. Debara Pickett.  1) Review labs ordered by PCP (Nov. 2014) 2) Advise labs to be ordered since stopping niacin

## 2013-07-26 NOTE — Telephone Encounter (Signed)
Please call-need to discuss somethings with the nurse.Pt did not state what he wanted to discuss.

## 2013-07-26 NOTE — Telephone Encounter (Signed)
Labs are pretty good - low HDL.  Could repeat cholesterol profile in 3-6 months after stopping niacin. If he wants me to repeat it, go ahead and order a standard lipid profile.  -Dr. Debara Pickett

## 2013-07-29 NOTE — Telephone Encounter (Signed)
Lab(s) ordered and Lab slip mailed.  

## 2013-08-28 DIAGNOSIS — G4730 Sleep apnea, unspecified: Secondary | ICD-10-CM

## 2013-09-09 LAB — LIPID PANEL
Cholesterol: 154 mg/dL (ref 0–200)
HDL: 40 mg/dL (ref >39)
LDL CALC: 85 mg/dL (ref 0–99)
Total CHOL/HDL Ratio: 3.9 Ratio
Triglycerides: 147 mg/dL (ref <150)
VLDL: 29 mg/dL (ref 0–40)

## 2013-09-10 ENCOUNTER — Encounter: Payer: Self-pay | Admitting: *Deleted

## 2013-09-10 ENCOUNTER — Other Ambulatory Visit: Payer: Self-pay | Admitting: Internal Medicine

## 2013-09-12 ENCOUNTER — Other Ambulatory Visit: Payer: Self-pay | Admitting: Internal Medicine

## 2013-09-12 ENCOUNTER — Ambulatory Visit (INDEPENDENT_AMBULATORY_CARE_PROVIDER_SITE_OTHER): Payer: Medicare Other | Admitting: Internal Medicine

## 2013-09-12 ENCOUNTER — Encounter: Payer: Self-pay | Admitting: Internal Medicine

## 2013-09-12 VITALS — BP 110/74 | HR 59 | Ht 70.0 in | Wt 199.1 lb

## 2013-09-12 DIAGNOSIS — Z951 Presence of aortocoronary bypass graft: Secondary | ICD-10-CM

## 2013-09-12 DIAGNOSIS — E785 Hyperlipidemia, unspecified: Secondary | ICD-10-CM

## 2013-09-12 DIAGNOSIS — R7302 Impaired glucose tolerance (oral): Secondary | ICD-10-CM

## 2013-09-12 DIAGNOSIS — I2510 Atherosclerotic heart disease of native coronary artery without angina pectoris: Secondary | ICD-10-CM

## 2013-09-12 DIAGNOSIS — R7309 Other abnormal glucose: Secondary | ICD-10-CM

## 2013-09-12 DIAGNOSIS — I10 Essential (primary) hypertension: Secondary | ICD-10-CM

## 2013-09-12 NOTE — Telephone Encounter (Signed)
Rx was sent to pharmacy electronically. 

## 2013-09-12 NOTE — Patient Instructions (Signed)
Your physician wants you to follow-up in: 1 year. You will receive a reminder letter in the mail two months in advance. If you don't receive a letter, please call our office to schedule the follow-up appointment.  

## 2013-09-12 NOTE — Progress Notes (Signed)
OFFICE NOTE  Chief Complaint:  No complaints  Primary Care Physician: Elby Showers, MD  HPI:  Keith Phelps is a 68 year old gentleman with history of coronary disease and CABG in 2002, LIMA to the LAD, SVG to RCA and SVG to ramus and an SVG to the OM. His angiogram in 2006 showed patent vein grafts. He has done well without any symptoms. He is able to exercise without any chest pain or worsening shortness of breath. 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 He also has dyslipidemia and hypertension.  He was recently diagnosed with borderline diabetes now diet controlled. He has made major changes in his diet and exercise to combat this. He reports over the last year he said problems with his shoulders and had arthroscopic surgery on the right shoulder. He been undergoing rehabilitation on and off. He has recently been missing some doses of his TriCor. We recently obtained a lipid profile that showed his total cholesterol 154, triglycerides 147, HDL 40 and LDL 85.  PMHx:  Past Medical History  Diagnosis Date  . Sleep apnea   . Coronary artery disease   . Hyperlipidemia     Past Surgical History  Procedure Laterality Date  . Coronary artery bypass graft    . Cholecystectomy    . Appendectomy      FAMHx:  No family history on file.  SOCHx:   reports that he has never smoked. He has never used smokeless tobacco. He reports that he does not drink alcohol or use illicit drugs.  ALLERGIES:  No Known Allergies  ROS: A comprehensive review of systems was negative except for: Musculoskeletal: positive for arthralgias  HOME MEDS: Current Outpatient Prescriptions  Medication Sig Dispense Refill  . aspirin 162 MG EC tablet Take 162 mg by mouth daily.        Marland Kitchen atorvastatin (LIPITOR) 40 MG tablet Take 40 mg by mouth daily.        Marland Kitchen b complex vitamins tablet Take 1 tablet by mouth daily.      . Choline Fenofibrate  (FENOFIBRIC ACID) 135 MG CPDR TAKE 1 CAPSULE (135 MG TOTAL) BY MOUTH DAILY.  30 capsule  3  . ramipril (ALTACE) 2.5 MG capsule Take 1 capsule (2.5 mg total) by mouth daily.  30 capsule  6  . Tamsulosin HCl (FLOMAX) 0.4 MG CAPS Take 0.4 mg by mouth.        . vitamin C (ASCORBIC ACID) 500 MG tablet Take 500 mg by mouth daily.       No current facility-administered medications for this visit.    LABS/IMAGING: No results found for this or any previous visit (from the past 48 hour(s)). No results found.  VITALS: BP 110/74  Pulse 59  Ht 5\' 10"  (1.778 m)  Wt 199 lb 1.6 oz (90.311 kg)  BMI 28.57 kg/m2  EXAM: General appearance: alert and no distress Neck: no carotid bruit, no JVD and thyroid not enlarged, symmetric, no tenderness/mass/nodules Lungs: clear to auscultation bilaterally Heart: regular rate and rhythm, S1, S2 normal, no murmur, click, rub or gallop Abdomen: soft, non-tender; bowel sounds normal; no masses,  no organomegaly Extremities: extremities normal, atraumatic, no cyanosis or edema Pulses: 2+ and symmetric Skin: Skin color, texture, turgor normal. No rashes or lesions Neurologic: Grossly normal Psych: Mood, affect normal  EKG: Normal sinus rhythm at 59  ASSESSMENT: 1. Coronary artery disease status post four-vessel CABG in 2002 2. Dyslipidemia-well controlled  3. Hypertension-at goal 4. Borderline diabetes-diet controlled  PLAN: 1.   Keith Phelps is doing well and denies any anginal pain. His last stress test was in 2011 and her guideline she is notable for repeat stress testing next year. I've encouraged him to continue with exercise and diet as he has a small increase in his cholesterol recently. Some of which may be due to not taking the fenofibrate as often as he should be. Otherwise I think he is doing really well. Plan to see him back annually or sooner as necessary.  Keith Casino, MD, [facility] Attending Cardiologist [facility]  HILTY,Kenneth  C 09/12/2013, 10:26 AM

## 2013-12-17 ENCOUNTER — Telehealth: Payer: Self-pay | Admitting: Internal Medicine

## 2013-12-17 NOTE — Telephone Encounter (Signed)
RN spoke with patient. He wishes to have another doctor manage his sleep apnea and RN informed him that Dr. Claiborne Billings manages sleep apnea. Patient is interested in setting up appmt with him to initialize monitoring of his sleep apnea.   Patient states that initially his pounds were 14 and he is down to 11lb (of pressure?) and he has lost weight since his initial study which patient reports occurred from 2007-2009 (actual date unknown)  Patient gets CPAP supplies from [facility] in [place].   He reports he may be due for another CPAP titration.   Will defer to Dr. Claiborne Billings to see if another CPAP titration should be ordered and completed prior to OV with Dr. Claiborne Billings. In meantime, patient will attempt to acquire sleep study documents.

## 2013-12-17 NOTE — Telephone Encounter (Signed)
Is asking if he needs to bring in the report of his sleep apnea test. Please call   Thanks

## 2013-12-17 NOTE — Telephone Encounter (Signed)
Patient would like the name of a Neurologist that can manage his sleep apnea.  He is already on CPAP, but it is being managed by his PCP.  Patient would like to see someone that specializes in sleep apnea.

## 2013-12-17 NOTE — Telephone Encounter (Signed)
Told to bring his tests with him on the day of his visit.  Voiced understanding.

## 2013-12-30 ENCOUNTER — Ambulatory Visit (INDEPENDENT_AMBULATORY_CARE_PROVIDER_SITE_OTHER): Payer: Medicare Other | Admitting: Cardiovascular Disease

## 2013-12-30 VITALS — BP 120/69 | HR 68 | Ht 70.5 in | Wt 195.9 lb

## 2013-12-30 DIAGNOSIS — G4730 Sleep apnea, unspecified: Secondary | ICD-10-CM

## 2013-12-30 DIAGNOSIS — Z951 Presence of aortocoronary bypass graft: Secondary | ICD-10-CM

## 2013-12-30 DIAGNOSIS — E8881 Metabolic syndrome: Secondary | ICD-10-CM

## 2013-12-30 DIAGNOSIS — Z9989 Dependence on other enabling machines and devices: Principal | ICD-10-CM

## 2013-12-30 DIAGNOSIS — I2510 Atherosclerotic heart disease of native coronary artery without angina pectoris: Secondary | ICD-10-CM

## 2013-12-30 DIAGNOSIS — G4733 Obstructive sleep apnea (adult) (pediatric): Secondary | ICD-10-CM

## 2013-12-30 DIAGNOSIS — I10 Essential (primary) hypertension: Secondary | ICD-10-CM

## 2013-12-30 NOTE — Patient Instructions (Signed)
Your physician has recommended that you have a sleep study. This test records several body functions during sleep, including: brain activity, eye movement, oxygen and carbon dioxide blood levels, heart rate and rhythm, breathing rate and rhythm, the flow of air through your mouth and nose, snoring, body muscle movements, and chest and belly movement. This will be scheduled as a split night study.  Your physician recommends that you schedule a follow-up appointment in: 2-3 months in a sleep clinic.

## 2014-01-06 ENCOUNTER — Encounter: Payer: Self-pay | Admitting: Cardiovascular Disease

## 2014-01-06 DIAGNOSIS — E8881 Metabolic syndrome: Secondary | ICD-10-CM | POA: Insufficient documentation

## 2014-01-06 NOTE — Progress Notes (Signed)
Patient ID: Keith Phelps, male   DOB: [birth year], 68 y.o.   MRN: [id number]     HPI: Keith Phelps is a 68 y.o. male who has been followed by Dr. presented to the past as well as Dr. Debara Pickett.  He has obstructive sleep apnea and has not been using this for several months.  He is referred for evaluation.  Keith Phelps has a history of known coronary artery disease and underwent CABG surgery in 2002 with a LIMA to the LAD, SVG to the RCA, SVG to the ramus intermediate vessel, and SVG to the obtuse marginal branch.  Cardiac catheterization in 2006 showed patent grafts.  He has a history of hyperlipidemia, as well as hypertension as well as borderline diabetes.  In 2010.  He was referred by Dr. presented to 4 polysomnogram study to evaluate sleep apnea.  This was interpreted by Dr. Marrion Coy in.  He was found to have severe obstructive sleep apnea and had a baseline AHI of 46.7.  Overall and poor REM sleep was increased at 52.8.  He dropped his oxygen saturation to 75% both with non-REM and REM sleep.  He was subsequently titrated on CPAP therapy up to 14 cm and use a full face mask.  He tells me in 2012 his pressure was reduced to 10 cm.  He did have approximately 25-30 pound weight loss.  At that time.  However, recently he stopped using CPAP for the last 3 months.  He does snore.  He does admit to some hypersomnolence.  He denies restless legs.  He's now referred for evaluation.   Epworth Sleepiness Scale: Situation   Chance of Dozing/Sleeping (0 = never , 1 = slight chance , 2 = moderate chance , 3 = high chance )   sitting and reading 2   watching TV 1   sitting inactive in a public place 2   being a passenger in a motor vehicle for an hour or more 1   lying down in the afternoon 2   sitting and talking to someone 0   sitting quietly after lunch (no alcohol) 2   while stopped for a few minutes in traffic as the driver 0   Total Score  10    Past Medical History  Diagnosis Date  . Sleep apnea    . Coronary artery disease   . Hyperlipidemia     Past Surgical History  Procedure Laterality Date  . Coronary artery bypass graft    . Cholecystectomy    . Appendectomy      No Known Allergies  Current Outpatient Prescriptions  Medication Sig Dispense Refill  . aspirin 162 MG EC tablet Take 162 mg by mouth daily.        Marland Kitchen atorvastatin (LIPITOR) 40 MG tablet TAKE 1 TABLET BY MOUTH DAILY  30 tablet  11  . b complex vitamins tablet Take 1 tablet by mouth daily.      . Choline Fenofibrate (FENOFIBRIC ACID) 135 MG CPDR TAKE 1 CAPSULE (135 MG TOTAL) BY MOUTH DAILY.  30 capsule  11  . ramipril (ALTACE) 2.5 MG capsule Take 1 capsule (2.5 mg total) by mouth daily.  30 capsule  6  . Tamsulosin HCl (FLOMAX) 0.4 MG CAPS Take 0.4 mg by mouth.        . vitamin C (ASCORBIC ACID) 500 MG tablet Take 500 mg by mouth daily.       No current facility-administered medications for this visit.    History  Social History  . Marital Status: Divorced    Spouse Name: N/A    Number of Children: N/A  . Years of Education: N/A   Occupational History  . Not on file.   Social History Main Topics  . Smoking status: Never Smoker   . Smokeless tobacco: Never Used  . Alcohol Use: No  . Drug Use: No  . Sexual Activity:    Other Topics Concern  . Not on file   Social History Narrative  . No narrative on file   Socially, he is retired.  There is no history of tobacco use.  He is not drink alcohol.  He was a English as a second language teacher.  Family history is notable that both parents are deceased.  Mother had lung cancer and breast cancer and died at 15, his father died at 65, with a stroke at 54.  He does not have any siblings.   ROS General: Negative; No fevers, chills, or night sweats HEENT: Negative; No changes in vision or hearing, sinus congestion, difficulty swallowing Pulmonary: Negative; No cough, wheezing, shortness of breath, hemoptysis Cardiovascular: History of prior CABG surgery in 2002; No chest pain,  presyncope, syncope, palpatations GI: Negative; No nausea, vomiting, diarrhea, or abdominal pain GU: Negative; No dysuria, hematuria, or difficulty voiding Musculoskeletal: Negative; no myalgias, joint pain, or weakness Hematologic: Negative; no easy bruising, bleeding Endocrine: Negative; no heat/cold intolerance Neuro: Negative; no changes in balance, headaches Skin: Negative; No rashes or skin lesions Psychiatric: Negative; No behavioral problems, depression Sleep: See history of present illness; mild daytime sleepiness, hypersomnolence, no bruxism, restless legs, hypnogognic hallucinations, no cataplexy   Physical Exam BP 120/69  Pulse 68  Ht 5' 10.5" (1.791 m)  Wt 195 lb 14.4 oz (88.86 kg)  BMI 27.70 kg/m2  General: Alert, oriented, no distress.  Skin: normal turgor, no rashes HEENT: Normocephalic, atraumatic. Pupils round and reactive; sclera anicteric; extraocular muscles intact; Fundi no hemorrhages or exudates.  Discs flat. Nose without nasal septal hypertrophy Mouth/Parynx benign; Mallinpatti scale 3 Neck: No JVD, no carotid briuts Lungs: clear to ausculatation and percussion; no wheezing or rales  Chest wall: No tenderness to palpation Heart: RRR, s1 s2 normal 1/6 systolic murmur.  No S3 or S4 gallop heard.  No diastolic murmur. Abdomen: soft, nontender; no hepatosplenomehaly, BS+; abdominal aorta nontender and not dilated by palpation. Back: No CVA tenderness Pulses 2+ Extremities: no clubbing cyanosis or edema, Homan's sign negative  Neurologic: grossly nonfocal; cranial nerves intact. Psychological: Normal affect and mood.  I did review a remote download of his CPAP unit from 05/06/2011 through 05/18/2011.  At that time, he was set on him, although CPAP machine with a mean pressure of 10.  His average peak pressure was 13.5.  AHI was elevated at 13.3.  He tells me he had been using 10 cm of water pressure.  Since 2012 and his pressure was never increased following this  download.  LABS:  BMET    Component Value Date/Time   NA 140 05/17/2013 0855   K 4.6 05/17/2013 0855   CL 106 05/17/2013 0855   CO2 26 05/17/2013 0855   GLUCOSE 91 05/17/2013 0855   BUN 16 05/17/2013 0855   CREATININE 1.00 05/17/2013 0855   CALCIUM 9.2 05/17/2013 0855     Hepatic Function Panel     Component Value Date/Time   PROT 6.4 05/17/2013 0855   ALBUMIN 4.0 05/17/2013 0855   AST 19 05/17/2013 0855   ALT 26 05/17/2013 0855   ALKPHOS 52 05/17/2013  0855   BILITOT 0.5 05/17/2013 0855     CBC    Component Value Date/Time   WBC 4.4 05/17/2013 0855   RBC 4.27 05/17/2013 0855   HGB 13.8 05/17/2013 0855   HCT 39.7 05/17/2013 0855   PLT 228 05/17/2013 0855   MCV 93.0 05/17/2013 0855   MCH 32.3 05/17/2013 0855   MCHC 34.8 05/17/2013 0855   RDW 12.7 05/17/2013 0855   LYMPHSABS 1.6 05/17/2013 0855   MONOABS 0.4 05/17/2013 0855   EOSABS 0.2 05/17/2013 0855   BASOSABS 0.0 05/17/2013 0855     BNP No results found for this basename: probnp    Lipid Panel     Component Value Date/Time   CHOL 154 09/09/2013 0849   TRIG 147 09/09/2013 0849   HDL 40 09/09/2013 0849   CHOLHDL 3.9 09/09/2013 0849   VLDL 29 09/09/2013 0849   LDLCALC 85 09/09/2013 0849     RADIOLOGY: No results found.    ASSESSMENT AND PLAN: Mr. Rhys Lichty is a 68 year old gentleman who has cardiovascular comorbidities, including undergoing CABG revascularization surgery in 2002, as well as a history of hypertension, and metabolic syndrome/borderline diabetes.  He does have severe obstructive sleep apnea originally diagnosed in 2010.  He download from 2012 when he may have had a loner machine with downloading capabilities indicated that his AHI was still elevated at 13.3, and his auto CPAP peak pressure was 13.5 and average device pressure greater than 90% of the time was 12.9.  He has not been using CPAP for at least 3 months.  He does snore.  He initially had lost some weight, but some of this has returned.  His blood pressure  today was controlled.  He is not aware of any arrhythmias.  He is unaware of restless legs or bruxism. It is my recommendation that he undergo a new sleep study, which be scheduled in a split-night protocol.  This new technology, the new machine's are much quieter and can be downloaded.  Wire was slowly.  Once his split-night study is complete, he will be referred for numerous sheen and Neupogen.  I had a long discussion with him and went over the importance of treating his sleep apnea and associated cardiovascular sequela.  If left untreated.  I will see him in a sleep clinic following reinstitution of new therapy for followup evaluation and further recommendations will be made at that time.  Time spent: [place], MD, [facility]  01/06/2014 7:00 PM

## 2014-01-28 ENCOUNTER — Other Ambulatory Visit: Payer: Self-pay | Admitting: General Surgery

## 2014-01-28 ENCOUNTER — Telehealth: Payer: Self-pay | Admitting: Cardiology

## 2014-01-28 DIAGNOSIS — G4733 Obstructive sleep apnea (adult) (pediatric): Secondary | ICD-10-CM

## 2014-01-28 NOTE — Telephone Encounter (Signed)
Follow up:     Pt returning your called please give him a call back. In regards to results.

## 2014-01-28 NOTE — Telephone Encounter (Signed)
Pt is aware.  

## 2014-01-28 NOTE — Telephone Encounter (Signed)
Spoke to pt to make aware of sleep results.

## 2014-01-28 NOTE — Telephone Encounter (Signed)
Please let patient know that he had moderate to severe OSA and set up for CPAP titration

## 2014-01-28 NOTE — Telephone Encounter (Signed)
lmtrc

## 2014-03-06 ENCOUNTER — Other Ambulatory Visit: Payer: Self-pay | Admitting: *Deleted

## 2014-03-06 MED ORDER — ATORVASTATIN CALCIUM 40 MG PO TABS: 40.0000 mg | ORAL_TABLET | Freq: Every day | ORAL | Status: DC

## 2014-03-06 MED ORDER — FENOFIBRIC ACID 135 MG PO CPDR: 135.0000 mg | DELAYED_RELEASE_CAPSULE | Freq: Every day | ORAL | Status: DC

## 2014-03-06 MED ORDER — RAMIPRIL 2.5 MG PO CAPS: 2.5000 mg | ORAL_CAPSULE | Freq: Every day | ORAL | Status: DC

## 2014-03-06 NOTE — Telephone Encounter (Signed)
Rx refills sent to patient pharmacy

## 2014-03-27 ENCOUNTER — Ambulatory Visit (HOSPITAL_BASED_OUTPATIENT_CLINIC_OR_DEPARTMENT_OTHER): Payer: Medicare Other | Attending: Cardiology | Admitting: Radiology

## 2014-03-27 VITALS — Ht 70.5 in | Wt 192.0 lb

## 2014-03-27 DIAGNOSIS — G4733 Obstructive sleep apnea (adult) (pediatric): Secondary | ICD-10-CM | POA: Insufficient documentation

## 2014-03-27 DIAGNOSIS — G4761 Periodic limb movement disorder: Secondary | ICD-10-CM | POA: Diagnosis not present

## 2014-03-31 ENCOUNTER — Other Ambulatory Visit: Payer: Self-pay | Admitting: General Surgery

## 2014-03-31 ENCOUNTER — Telehealth: Payer: Self-pay | Admitting: Cardiology

## 2014-03-31 DIAGNOSIS — G4733 Obstructive sleep apnea (adult) (pediatric): Secondary | ICD-10-CM

## 2014-03-31 NOTE — Telephone Encounter (Signed)
Please let patient know that he has a successful CPAP titration.  Please set up patient with [facility] for a ResMed CPAP device with heated humidifier, CPAP set at 15cm H2O with Cflex of 3 and medium Resmed Mirage Quattro full face mask and set up OV with me in 10 weeks.

## 2014-03-31 NOTE — Telephone Encounter (Signed)
No he can followup with Dr Claiborne Billings for is sleep apnea

## 2014-03-31 NOTE — Sleep Study (Signed)
   PATIENT NAME:  Keith Phelps DATE OF BIRTH:  17-Aug-1945 MEDICAL RECORD NUMBER [id number] LOCATION:  [facility] REFERRING PHYSICIAN:  Fransico Him, MD READING PHYSICIAN:  Fransico Him, MD DATE OF STUDY:  03/27/2014  SLEEP STUDY TYPE:  CPAP Titration  INDICATION FOR STUDY:  Obstructive Sleep Apnea/Hypopnea Syndrome  EPWORTH SLEEPINESS SCAL:  8 HEIGHT:  5'10.5" WEIGHT:  192lbs NECK SIZE:  16.5"  MEDICATION:  Reviewed in the sleep record.  SLEEP ARCHITECTURE:  The patient had a total sleep time of 312 minutes, with reduced slow wave sleep at 1.5 minutes and reduced REM sleep at 48 minutes.  The sleep onset latency was normal at 2.5 minutes and REM onset was normal.  Sleep Efficiency was reduced at 78% but increased to 100% on CPAP at 15cm H2O.  RESPIRATORY DATA:  CPAP was started at 5cm H2O and titrated to 17cm H2O.  The patient tolerated the CPAP well.  The AHI was 3.8 events per hour at a CPAP of 15cm H2O.  The patient was to sleep for prolonged periods of time on a CPAP of 15cm H2O in the REM supine position without any significant respiratory events.    OXYGEN DATA:  The baseline O2 sat was 97% with lowest O2 sat during REM sleep at 88% and during NREM sleep at 90%.  There were no O2 desats below 90% at CPAP of 15cm H2O.  CARDIAC DATA:  The patient maintained NSR thoughout the study.  MOVEMENT/PARASOMNIA:   There was a mild increase in periodic limb movements with a PLMS index of 9.  IMPRESSION: 1.  Obstructive sleep apnea/hypopnea syndrome. 2.  Successful CPAP titration to 15cm H2O.  RECOMMENDATIONS: 1. Recommend ResMed CPAP device with heated humidifier, with a CPAP pressure of 15cm H2O with C flex of 3 and medium Resmed Mirage Quattro full face mask. 2.  The patient should be counseled in good sleep hygiene.   3.  The patient should be counseled to avoid sleeping in the supine position.  Signed: Fransico Him, MD Diplomate, American Board of Sleep  Medicine [facility] 03/31/2014

## 2014-03-31 NOTE — Telephone Encounter (Signed)
Pt saw Dr Claiborne Billings last in June of this year. Is he switching over to [place]?

## 2014-04-01 NOTE — Telephone Encounter (Signed)
[facility] has order for CPAP. TO Dr Claiborne Billings to send to nurse to get pt set up in office with them.

## 2014-04-01 NOTE — Telephone Encounter (Signed)
To set up after CPAP  and download

## 2014-04-07 ENCOUNTER — Telehealth: Payer: Self-pay | Admitting: Cardiovascular Disease

## 2014-04-07 NOTE — Telephone Encounter (Signed)
Keith Phelps is calling with 2 messages: 1:  Dr. Debara Pickett  Nurse please look at his records to see if he was suppose to get a blood workup for Dr. Debara Pickett to review.  2: Have taken the second sleep study about a week ago and wants to know if he is suppose to schedule an appt to see dr. Claiborne Billings .Marland Kitchen Please call .Marland Kitchen Can leave a message on the machine if he is not there.    Thanks

## 2014-04-07 NOTE — Telephone Encounter (Signed)
Pt. Informed that no labs needed and Dr. Debara Pickett to review sleep study before recommendations made

## 2014-04-22 ENCOUNTER — Telehealth: Payer: Self-pay | Admitting: General Surgery

## 2014-04-22 NOTE — Telephone Encounter (Signed)
This is Dr Ermalinda Memos Pt and he is to follow up with Pt per Dr Radford Pax.

## 2014-04-22 NOTE — Telephone Encounter (Signed)
Message copied by Lily Kocher on Tue Apr 22, 2014  7:42 AM ------      Message from: Gilda Crease      Created: Mon Apr 21, 2014  3:19 PM      Regarding: following patient       Dr Radford Pax      Will you be following this patient or Dr Claiborne Billings?  I rec'd a message from Dr Evette Georges RN about them getting a DL after he is setup.            I have not gotten an order yet but want to make sure I have everything straight.      Thanks      [employer]       ------

## 2014-04-23 NOTE — Telephone Encounter (Signed)
Per Gwinda Passe they will obtain a download and send result to Dr. Claiborne Billings

## 2014-04-25 NOTE — Addendum Note (Signed)
Addended by: Fransico Him R on: 04/25/2014 01:11 PM   Modules accepted: Orders

## 2014-04-30 ENCOUNTER — Telehealth: Payer: Self-pay | Admitting: Cardiovascular Disease

## 2014-04-30 NOTE — Telephone Encounter (Signed)
Patient had been diagnosed with OSA previously and was referred to Dr. Claiborne Billings for OSA/CPAP management when patient called in requesting a new provider (back in June) as his previous PCP managed and then retired. Patient saw Dr. Claiborne Billings in office who ordered PSG & split night study. PSG was done at [facility] in July (is not scanned into EPIC to my findings) and then he had CPAP titration (per Dr. Theodosia Blender orders - via telephone documentation on 01/28/14) at [facility]. Patient has not gotten any new orders for his CPAP r/t to the new settings recommended by Dr. Radford Pax on 03/31/14 (routed to Dr. Claiborne Billings & Mariann Laster to have [facility] manage). Patient has been using his travel CPAP machine but has 2 old ResMed2 machines. He has uses [employer] [facility] in [place] previously to get supplies. I recommended him to use [facility] to get supplies.   Patient needs an updated Rx based on most recent CPAP titration and a standing order for supplies.

## 2014-04-30 NOTE — Telephone Encounter (Signed)
Faxed sleep study referral, sleep study documents, CPAP titration, OV note, demographics to [facility] @ [phone]

## 2014-04-30 NOTE — Telephone Encounter (Signed)
Sleep study results to be faxed from [facility] in [place] to be faxed to [facility] ([place] per patient)

## 2014-04-30 NOTE — Telephone Encounter (Signed)
Pt is very very concerned. He had 2 sleep studies and have not heard from either one of them. The first one was in July.

## 2014-07-16 ENCOUNTER — Telehealth: Payer: Self-pay | Admitting: Internal Medicine

## 2014-07-16 DIAGNOSIS — I10 Essential (primary) hypertension: Secondary | ICD-10-CM

## 2014-07-16 DIAGNOSIS — E785 Hyperlipidemia, unspecified: Secondary | ICD-10-CM

## 2014-07-16 NOTE — Telephone Encounter (Signed)
Returned call to patient he stated he would like to have fasting lab before his appointment with Dr.Hilty.[facility] will put in order for fasting lab 09/09/14 at [facility].

## 2014-07-16 NOTE — Telephone Encounter (Signed)
Keith Phelps is coming in on 09/16/14 and wants to know does he need to have a fasting lab done. Please call .Marland Kitchen Thanks

## 2014-07-21 ENCOUNTER — Encounter: Payer: Self-pay | Admitting: Cardiovascular Disease

## 2014-07-21 ENCOUNTER — Ambulatory Visit (INDEPENDENT_AMBULATORY_CARE_PROVIDER_SITE_OTHER): Payer: BLUE CROSS/BLUE SHIELD | Admitting: Cardiovascular Disease

## 2014-07-21 VITALS — BP 102/62 | HR 67 | Ht 70.0 in | Wt 202.6 lb

## 2014-07-21 DIAGNOSIS — Z951 Presence of aortocoronary bypass graft: Secondary | ICD-10-CM

## 2014-07-21 DIAGNOSIS — G4730 Sleep apnea, unspecified: Secondary | ICD-10-CM

## 2014-07-21 DIAGNOSIS — E785 Hyperlipidemia, unspecified: Secondary | ICD-10-CM

## 2014-07-21 DIAGNOSIS — I2510 Atherosclerotic heart disease of native coronary artery without angina pectoris: Secondary | ICD-10-CM

## 2014-07-21 DIAGNOSIS — I2583 Coronary atherosclerosis due to lipid rich plaque: Principal | ICD-10-CM

## 2014-07-21 NOTE — Patient Instructions (Signed)
Your physician recommends that you schedule a follow-up appointment as needed for sleep with Dr. Claiborne Billings.

## 2014-07-21 NOTE — Progress Notes (Signed)
Patient ID: Keith Phelps, male   DOB: 01/28/1946, 69 y.o.   MRN: [id number]     HPI: Keith Phelps is a 69 y.o. male who has been followed by Dr. Felton Phelps in the past as well as Dr. Debara Phelps.  He has obstructive sleep apnea.  I saw him for initial evaluation in June 2015  Mr. Keith Phelps has  known CAD and underwent CABG surgery in 2002 with a LIMA to the LAD, SVG to the RCA, SVG to the ramus intermediate vessel, and SVG to the obtuse marginal branch.  Cardiac catheterization in 2006 showed patent grafts.  He has a history of hyperlipidemia, as well as hypertension as well as borderline diabetes.  In 2010  A polysomnogram study to evaluate sleep apnea was interpreted by Dr. Marrion Phelps in [place] demonstrated severe obstructive sleep apnea and had a baseline AHI of 46.7.  Overall and poor REM sleep was increased at 52.8.  He dropped his oxygen saturation to 75% both with non-REM and REM sleep.  He was subsequently titrated on CPAP therapy up to 14 cm and use a full face mask.  He tells me in 2012 his pressure was reduced to 10 cm.  He did have approximately 25-30 pound weight loss.  At that time he stopped using CPAP for the last 3 months.  He does snore.  He does admit to some hypersomnolence.  He denies restless legs.  When I saw him, his Epworth Sleepiness Scale score endorsed at 10.  He was no longer using CPAP therapy, and I recommended a reevaluation.  He underwent a repeat PSG in July at the [facility] and is AHI was compatible with moderate sleep apnea at 24 and during REM sleep was severe at 46.3.  Oxygen desaturated to 83% with non-REM sleep and 76% with REM sleep.  There was loud snoring.  He was nocturnal myoclonus.  He subsequently had his CPAP titration trial at [facility] and this was interpreted by Dr. Radford Phelps.  I've never received this information, but he was titrated up to a 15 cm water pressure excessively and a medium ResMed [employer] full face mask was  recommended.  He has recently been set up by [facility] with a fair sense 10 AutoSet unit.,  He is retired and does travel.  He has self purchased a traveling unit which is an HDM Z1 unit.  A recent download from 05/13/2014 through 07/13/2014 only showed 68 days of use.  However, the patient one on several trips during this time and has resulted not use the new air since ResMed unit on his trips used the travel smaller unit.  As result, he is compliant with CPAP use.  His recent download from December 5 through 07/13/2014 showed 70% of usage stays, but again he was traveling at the beginning portion of December and was using his traveling unit.  He was set at a 15 cm water pressure and is AHI was 7.  He cannot sleep on his side.  He was noted to have significant leak.  Upon further questioning, he has been making the mass exceptionally tight which may be contributing to his leak.  He presents for evaluation.  Previous Epworth Sleepiness Scale: Situation   Chance of Dozing/Sleeping (0 = never , 1 = slight chance , 2 = moderate chance , 3 = high chance )   sitting and reading 2   watching TV 1   sitting inactive in a public  place 2   being a passenger in a motor vehicle for an hour or more 1   lying down in the afternoon 2   sitting and talking to someone 0   sitting quietly after lunch (no alcohol) 2   while stopped for a few minutes in traffic as the driver 0   Total Score  10    Past Medical History  Diagnosis Date  . Sleep apnea   . Coronary artery disease   . Hyperlipidemia     Past Surgical History  Procedure Laterality Date  . Coronary artery bypass graft    . Cholecystectomy    . Appendectomy      No Known Allergies  Current Outpatient Prescriptions  Medication Sig Dispense Refill  . aspirin 162 MG EC tablet Take 162 mg by mouth daily.      Marland Kitchen atorvastatin (LIPITOR) 40 MG tablet Take 1 tablet (40 mg total) by mouth daily. 90 tablet 2  . b complex vitamins tablet Take  1 tablet by mouth daily.    . Choline Fenofibrate (FENOFIBRIC ACID) 135 MG CPDR Take 135 mg by mouth daily. 90 capsule 2  . LUMIGAN 0.01 % SOLN Place 1 drop into both eyes at bedtime.   0  . ramipril (ALTACE) 2.5 MG capsule Take 1 capsule (2.5 mg total) by mouth daily. 90 capsule 2  . Tamsulosin HCl (FLOMAX) 0.4 MG CAPS Take 0.4 mg by mouth.      . vitamin C (ASCORBIC ACID) 500 MG tablet Take 500 mg by mouth daily.     No current facility-administered medications for this visit.    History   Social History  . Marital Status: Divorced    Spouse Name: N/A    Number of Children: N/A  . Years of Education: N/A   Occupational History  . Not on file.   Social History Main Topics  . Smoking status: Never Smoker   . Smokeless tobacco: Never Used  . Alcohol Use: No  . Drug Use: No  . Sexual Activity: Not on file   Other Topics Concern  . Not on file   Social History Narrative   Socially, he is retired.  There is no history of tobacco use.  He is not drink alcohol.  He was a English as a second language teacher.  Family history is notable that both parents are deceased.  Mother had lung cancer and breast cancer and died at 32, his father died at 20, with a stroke at 43.  He does not have any siblings.   ROS General: Negative; No fevers, chills, or night sweats HEENT: Negative; No changes in vision or hearing, sinus congestion, difficulty swallowing Pulmonary: Negative; No cough, wheezing, shortness of breath, hemoptysis Cardiovascular: History of prior CABG surgery in 2002; No chest pain, presyncope, syncope, palpatations GI: Negative; No nausea, vomiting, diarrhea, or abdominal pain GU: Negative; No dysuria, hematuria, or difficulty voiding Musculoskeletal: Negative; no myalgias, joint pain, or weakness Hematologic: Negative; no easy bruising, bleeding Endocrine: Negative; no heat/cold intolerance Neuro: Negative; no changes in balance, headaches Skin: Negative; No rashes or skin lesions Psychiatric:  Negative; No behavioral problems, depression Sleep: See history of present illness; mild daytime sleepiness, hypersomnolence improved with CPAP, no bruxism, restless legs, hypnogognic hallucinations, no cataplexy   Physical Exam BP 102/62 mmHg  Pulse 67  Ht 5\' 10"  (1.778 m)  Wt 202 lb 9.6 oz (91.899 kg)  BMI 29.07 kg/m2  General: Alert, oriented, no distress.  Skin: normal turgor, no rashes HEENT: Normocephalic, atraumatic.  Pupils round and reactive; sclera anicteric; extraocular muscles intact; Fundi no hemorrhages or exudates.  Discs flat. Nose without nasal septal hypertrophy Mouth/Parynx benign; Mallinpatti scale 3 Neck: No JVD, no carotid briuts Lungs: clear to ausculatation and percussion; no wheezing or rales  Chest wall: No tenderness to palpation Heart: RRR, s1 s2 normal 1/6 systolic murmur.  No S3 or S4 gallop heard.  No diastolic murmur. Abdomen: soft, nontender; no hepatosplenomehaly, BS+; abdominal aorta nontender and not dilated by palpation. Back: No CVA tenderness Pulses 2+ Extremities: no clubbing cyanosis or edema, Homan's sign negative  Neurologic: grossly nonfocal; cranial nerves intact. Psychological: Normal affect and mood.  Downloads from 05/13/2014 through 07/13/2014 and 06/14/2014 through 07/13/2014 were reviewed as noted above  I did review a remote download of his CPAP unit from 05/06/2011 through 05/18/2011.  At that time, he was set on him, although CPAP machine with a mean pressure of 10.  His average peak pressure was 13.5.  AHI was elevated at 13.3.  He tells me he had been using 10 cm of water pressure.  Since 2012 and his pressure was never increased following this download.  LABS:  BMET    Component Value Date/Time   NA 140 05/17/2013 0855   K 4.6 05/17/2013 0855   CL 106 05/17/2013 0855   CO2 26 05/17/2013 0855   GLUCOSE 91 05/17/2013 0855   BUN 16 05/17/2013 0855   CREATININE 1.00 05/17/2013 0855   CALCIUM 9.2 05/17/2013 0855      Hepatic Function Panel     Component Value Date/Time   PROT 6.4 05/17/2013 0855   ALBUMIN 4.0 05/17/2013 0855   AST 19 05/17/2013 0855   ALT 26 05/17/2013 0855   ALKPHOS 52 05/17/2013 0855   BILITOT 0.5 05/17/2013 0855     CBC    Component Value Date/Time   WBC 4.4 05/17/2013 0855   RBC 4.27 05/17/2013 0855   HGB 13.8 05/17/2013 0855   HCT 39.7 05/17/2013 0855   PLT 228 05/17/2013 0855   MCV 93.0 05/17/2013 0855   MCH 32.3 05/17/2013 0855   MCHC 34.8 05/17/2013 0855   RDW 12.7 05/17/2013 0855   LYMPHSABS 1.6 05/17/2013 0855   MONOABS 0.4 05/17/2013 0855   EOSABS 0.2 05/17/2013 0855   BASOSABS 0.0 05/17/2013 0855     BNP No results found for: PROBNP  Lipid Panel     Component Value Date/Time   CHOL 154 09/09/2013 0849   TRIG 147 09/09/2013 0849   HDL 40 09/09/2013 0849   CHOLHDL 3.9 09/09/2013 0849   VLDL 29 09/09/2013 0849   LDLCALC 85 09/09/2013 0849     RADIOLOGY: No results found.    ASSESSMENT AND PLAN: Mr. Mayan Kloepfer is a 69 year old gentleman who has cardiovascular comorbidities including CABG revascularization surgery in 2002, as well as a history of hypertension, and metabolic syndrome/borderline diabetes.  He does have severe obstructive sleep apnea originally diagnosed in 2010.  He download from 2012 when he may have had a loner machine with downloading capabilities indicated that his AHI was still elevated at 13.3, and his auto CPAP peak pressure was 13.5 and average device pressure greater than 90% of the time was 12.9.  He has not been using CPAP for at leas several months when I had reevaluated him this past summer.  Reevaluation has confirmed moderate sleep apnea and he was titrated to a 15 cm water pressure on his most recent CPAP titration trial.  He has been sleeping exclusively on  his back.  He would like the opportunity to sleep at his side and has had mask issues.  [facility] from [facility] along with myself.  Spends  considerable time with him.  I am changing him to an auto mode rather than a 15 cm fixed pressure.  He has had significant leak and adjustments were made to his mask.  I discussed the importance of increased sleep duration.  On his most recent download sleep, was inefficient at only 4 hours and 3 minutes of CPAP usage discussed the importance of sleeping for 7 to 8 hours with continued use.  A repeat download will be obtained in 30 days and the potential adjustments may be made at that time.  His blood pressure was stable as was his cardiac rhythm.  I willbe available to see him from a sleep perspective as problems arise.   Time spent: 30 minutes  Troy Sine, MD, [facility]  07/21/2014 7:16 PM

## 2014-07-22 ENCOUNTER — Encounter: Payer: Self-pay | Admitting: Internal Medicine

## 2014-08-18 ENCOUNTER — Encounter: Payer: Self-pay | Admitting: Internal Medicine

## 2014-08-26 ENCOUNTER — Telehealth: Payer: Self-pay | Admitting: Cardiovascular Disease

## 2014-08-26 NOTE — Telephone Encounter (Signed)
Returned call to patient he stated he was returning Jenna's call.Message sent to [place].

## 2014-08-26 NOTE — Telephone Encounter (Signed)
Returning Call to  about his Cpap machine . Please call    Thanks

## 2014-08-26 NOTE — Telephone Encounter (Signed)
Returned call to patient. Reported to him the results of his CPAP compliance download.

## 2014-09-02 ENCOUNTER — Telehealth: Payer: Self-pay | Admitting: Internal Medicine

## 2014-09-02 NOTE — Telephone Encounter (Signed)
[employer] have removed Fenofibric Acid from the formulary.Pt says he need new prescription and papers for exemption are being sent.Please advise.

## 2014-09-02 NOTE — Telephone Encounter (Signed)
Pt states [employer] sending PA paperwork over for his Fenofibric Acid.  Pt states has a few days left of medicine.

## 2014-09-04 NOTE — Telephone Encounter (Signed)
PA for fenofibric acid faxed to [facility]

## 2014-09-09 LAB — CBC WITH DIFFERENTIAL/PLATELET
BASOS PCT: 0 % (ref 0–1)
Basophils Absolute: 0 10*3/uL (ref 0.0–0.1)
EOS ABS: 0.2 10*3/uL (ref 0.0–0.7)
Eosinophils Relative: 4 % (ref 0–5)
HEMATOCRIT: 42.8 % (ref 39.0–52.0)
Hemoglobin: 14.4 g/dL (ref 13.0–17.0)
Lymphocytes Relative: 28 % (ref 12–46)
Lymphs Abs: 1.3 10*3/uL (ref 0.7–4.0)
MCH: 32.2 pg (ref 26.0–34.0)
MCHC: 33.6 g/dL (ref 30.0–36.0)
MCV: 95.7 fL (ref 78.0–100.0)
MONO ABS: 0.5 10*3/uL (ref 0.1–1.0)
MONOS PCT: 11 % (ref 3–12)
MPV: 9.7 fL (ref 8.6–12.4)
Neutro Abs: 2.7 10*3/uL (ref 1.7–7.7)
Neutrophils Relative %: 57 % (ref 43–77)
PLATELETS: 279 10*3/uL (ref 150–400)
RBC: 4.47 MIL/uL (ref 4.22–5.81)
RDW: 12.9 % (ref 11.5–15.5)
WBC: 4.8 10*3/uL (ref 4.0–10.5)

## 2014-09-09 LAB — LIPID PANEL
Cholesterol: 140 mg/dL (ref 0–200)
HDL: 39 mg/dL — ABNORMAL LOW (ref >=40)
LDL Cholesterol: 77 mg/dL (ref 0–99)
Total CHOL/HDL Ratio: 3.6 Ratio
Triglycerides: 122 mg/dL (ref <150)
VLDL: 24 mg/dL (ref 0–40)

## 2014-09-09 LAB — HEMOGLOBIN A1C
Hgb A1c MFr Bld: 5.9 % — ABNORMAL HIGH (ref <5.7)
Mean Plasma Glucose: 123 mg/dL — ABNORMAL HIGH (ref <117)

## 2014-09-10 LAB — COMPREHENSIVE METABOLIC PANEL
ALT: 21 U/L (ref 0–53)
AST: 19 U/L (ref 0–37)
Albumin: 4.4 g/dL (ref 3.5–5.2)
Alkaline Phosphatase: 52 U/L (ref 39–117)
BILIRUBIN TOTAL: 0.6 mg/dL (ref 0.2–1.2)
BUN: 16 mg/dL (ref 6–23)
CALCIUM: 9.2 mg/dL (ref 8.4–10.5)
CHLORIDE: 104 meq/L (ref 96–112)
CO2: 24 mEq/L (ref 19–32)
CREATININE: 1.16 mg/dL (ref 0.50–1.35)
Glucose, Bld: 104 mg/dL — ABNORMAL HIGH (ref 70–99)
Potassium: 4.5 mEq/L (ref 3.5–5.3)
Sodium: 139 mEq/L (ref 135–145)
Total Protein: 7.1 g/dL (ref 6.0–8.3)

## 2014-09-10 NOTE — Telephone Encounter (Signed)
Trying to see if this encounter could be closed.

## 2014-09-11 NOTE — Telephone Encounter (Signed)
PA was denied. Will defer to Dr. Debara Pickett to assist with approval of this medication

## 2014-09-16 ENCOUNTER — Ambulatory Visit (INDEPENDENT_AMBULATORY_CARE_PROVIDER_SITE_OTHER): Payer: BLUE CROSS/BLUE SHIELD | Admitting: Internal Medicine

## 2014-09-16 ENCOUNTER — Encounter: Payer: Self-pay | Admitting: Internal Medicine

## 2014-09-16 VITALS — BP 110/66 | HR 62 | Ht 70.5 in | Wt 206.0 lb

## 2014-09-16 DIAGNOSIS — R7302 Impaired glucose tolerance (oral): Secondary | ICD-10-CM

## 2014-09-16 DIAGNOSIS — R0602 Shortness of breath: Secondary | ICD-10-CM

## 2014-09-16 DIAGNOSIS — G4730 Sleep apnea, unspecified: Secondary | ICD-10-CM

## 2014-09-16 DIAGNOSIS — I2583 Coronary atherosclerosis due to lipid rich plaque: Secondary | ICD-10-CM

## 2014-09-16 DIAGNOSIS — I10 Essential (primary) hypertension: Secondary | ICD-10-CM

## 2014-09-16 DIAGNOSIS — Z951 Presence of aortocoronary bypass graft: Secondary | ICD-10-CM

## 2014-09-16 DIAGNOSIS — E8881 Metabolic syndrome: Secondary | ICD-10-CM

## 2014-09-16 DIAGNOSIS — E785 Hyperlipidemia, unspecified: Secondary | ICD-10-CM

## 2014-09-16 DIAGNOSIS — I2510 Atherosclerotic heart disease of native coronary artery without angina pectoris: Secondary | ICD-10-CM

## 2014-09-16 MED ORDER — FENOFIBRATE 145 MG PO TABS: 145.0000 mg | ORAL_TABLET | Freq: Every day | ORAL | Status: DC

## 2014-09-16 NOTE — Patient Instructions (Addendum)
Your physician has requested that you have an exercise stress myoview. For further information please visit [email or web address]. Please follow instruction sheet, as given.  Your physician has recommended you make the following change in your medication: STOP fenofibric acid acid - START fenofibrate 145mg  daily >> this has been sent to your pharmacy   Your physician wants you to follow-up in: 1 year with Dr. Debara Pickett. You will receive a reminder letter in the mail two months in advance. If you don't receive a letter, please call our office to schedule the follow-up appointment.

## 2014-09-16 NOTE — Progress Notes (Signed)
OFFICE NOTE  Chief Complaint:  No complaints  Primary Care Physician: Elby Showers, MD  HPI:  Keith Phelps is a 69 year old gentleman with history of coronary disease and CABG in 2002, LIMA to the LAD, SVG to RCA and SVG to ramus and an SVG to the OM. His angiogram in 2006 showed patent vein grafts. He has done well without any symptoms. He is able to exercise without any chest pain or worsening shortness of breath. 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 He also has dyslipidemia and hypertension.  He was recently diagnosed with borderline diabetes now diet controlled. He has made major changes in his diet and exercise to combat this. He reports over the last year he said problems with his shoulders and had arthroscopic surgery on the right shoulder. He been undergoing rehabilitation on and off. He has recently been missing some doses of his TriCor. We recently obtained a lipid profile that showed his total cholesterol 154, triglycerides 147, HDL 40 and LDL 85.  I saw Mr. Dinan back in the office today. He is currently without complaints. Unfortunately he's gained some weight and stopped doing so his exercise. He denies any chest pain or significant worsening shortness of breath. His last nuclear stress test was in 2011 and his bypass was in 2002. He's had good cholesterol control and recent laboratory work continues to show his cholesterol is at goal. Unfortunately his A1c is borderline elevated at 5.9 with a fasting glucose of 123. The rest of his laboratory work is within normal limits.  PMHx:  Past Medical History  Diagnosis Date  . Sleep apnea   . Coronary artery disease   . Hyperlipidemia     Past Surgical History  Procedure Laterality Date  . Cholecystectomy    . Appendectomy    . Doppler echocardiography  02/21/2012    EF > 55%  . Doppler echocardiography  03/16/2010    EF 45-50%  . Nm myoview ltd  03/16/2010    EF 64%  Low risk study  . Cardiac catheterization  06/13/2005    [facility]  EF 55%  . Coronary artery bypass graft  07/18/2000    FAMHx:  No family history on file.  SOCHx:   reports that he has never smoked. He has never used smokeless tobacco. He reports that he does not drink alcohol or use illicit drugs.  ALLERGIES:  No Known Allergies  ROS: A comprehensive review of systems was negative except for: Musculoskeletal: positive for arthralgias  HOME MEDS: Current Outpatient Prescriptions  Medication Sig Dispense Refill  . aspirin 162 MG EC tablet Take 162 mg by mouth daily.      Marland Kitchen atorvastatin (LIPITOR) 40 MG tablet Take 1 tablet (40 mg total) by mouth daily. 90 tablet 2  . b complex vitamins tablet Take 1 tablet by mouth daily.    Marland Kitchen LUMIGAN 0.01 % SOLN Place 1 drop into both eyes at bedtime.   0  . Multiple Vitamin (MULTIVITAMIN) tablet Take 1 tablet by mouth daily.    . ramipril (ALTACE) 2.5 MG capsule Take 1 capsule (2.5 mg total) by mouth daily. 90 capsule 2  . Tamsulosin HCl (FLOMAX) 0.4 MG CAPS Take 0.4 mg by mouth.      . TRAVATAN Z 0.004 % SOLN ophthalmic solution Take 1 drop by mouth at bedtime.  1  . vitamin C (ASCORBIC ACID) 500 MG tablet Take 500 mg by  mouth daily.    . fenofibrate (TRICOR) 145 MG tablet Take 1 tablet (145 mg total) by mouth daily. 30 tablet 6   No current facility-administered medications for this visit.    LABS/IMAGING: No results found for this or any previous visit (from the past 48 hour(s)). No results found.  VITALS: BP 110/66 mmHg  Pulse 62  Ht 5' 10.5" (1.791 m)  Wt 206 lb (93.441 kg)  BMI 29.13 kg/m2  EXAM: General appearance: alert and no distress Neck: no carotid bruit, no JVD and thyroid not enlarged, symmetric, no tenderness/mass/nodules Lungs: clear to auscultation bilaterally Heart: regular rate and rhythm, S1, S2 normal, no murmur, click, rub or gallop Abdomen: soft, non-tender; bowel sounds normal; no masses,   no organomegaly Extremities: extremities normal, atraumatic, no cyanosis or edema Pulses: 2+ and symmetric Skin: Skin color, texture, turgor normal. No rashes or lesions Neurologic: Grossly normal Psych: Mood, affect normal  EKG: Normal sinus rhythm at 62  ASSESSMENT: 1. Coronary artery disease status post four-vessel CABG in 2002 2. Dyslipidemia-well controlled 3. Hypertension-at goal 4. Borderline diabetes-diet controlled - A1c 5.9  PLAN: 1.   Mr. Mcgranahan is doing well and denies any anginal pain. His last stress test was in 2011 and per guideline he is due for repeat stress testing this year. I've encouraged him to continue with exercise and diet as he has a small increase in his weight recently. Otherwise I think he is doing really well. As mentioned, cholesterol is at goal in blood pressure is controlled. Will need to lose the weight in order for his blood sugars to improve. I will contact him with the results of the stress test, but otherwise plan to see him back annually or sooner as necessary.  Pixie Casino, MD, [facility] Attending Cardiologist [facility]  Azarian Starace C 09/16/2014, 6:23 PM

## 2014-09-16 NOTE — Telephone Encounter (Signed)
Medication changed to fenofibrate 145mg  once daily by Dr. Debara Pickett at [facility] 3/8

## 2014-09-23 ENCOUNTER — Telehealth (HOSPITAL_COMMUNITY): Payer: Self-pay

## 2014-09-23 ENCOUNTER — Encounter: Payer: Self-pay | Admitting: *Deleted

## 2014-09-23 NOTE — Telephone Encounter (Signed)
Encounter complete. 

## 2014-09-25 ENCOUNTER — Ambulatory Visit (HOSPITAL_COMMUNITY)
Admission: RE | Admit: 2014-09-25 | Discharge: 2014-09-25 | Disposition: A | Discharge status: Home / Self Care | Payer: Medicare Other | Source: Ambulatory Visit | Attending: Internal Medicine | Admitting: Internal Medicine

## 2014-09-25 DIAGNOSIS — I2510 Atherosclerotic heart disease of native coronary artery without angina pectoris: Secondary | ICD-10-CM | POA: Insufficient documentation

## 2014-09-25 DIAGNOSIS — R0600 Dyspnea, unspecified: Secondary | ICD-10-CM | POA: Diagnosis not present

## 2014-09-25 DIAGNOSIS — R0602 Shortness of breath: Secondary | ICD-10-CM

## 2014-09-25 DIAGNOSIS — Z951 Presence of aortocoronary bypass graft: Secondary | ICD-10-CM | POA: Diagnosis not present

## 2014-09-25 MED ORDER — TECHNETIUM TC 99M SESTAMIBI GENERIC - CARDIOLITE
32.8000 | Freq: Once | INTRAVENOUS | Status: AC | PRN
  Administered 2014-09-25: 32.8 via INTRAVENOUS

## 2014-09-25 MED ORDER — TECHNETIUM TC 99M SESTAMIBI GENERIC - CARDIOLITE
10.9000 | Freq: Once | INTRAVENOUS | Status: AC | PRN
  Administered 2014-09-25: 10.9 via INTRAVENOUS

## 2014-09-25 NOTE — Procedures (Addendum)
[facility] [address] [place] [address] [phone]  Cardiology Nuclear Med Study  Keith Phelps is a 69 y.o. male     MRN : [id number]     DOB: 01-18-1946  Procedure Date: 09/25/2014  Nuclear Med Background Indication for Stress Test:  Follow up CAD History:  CAD;CABG X4--2002;Last NUC MPI in 2011;CATH in 2006-patent grafts Cardiac Risk Factors: Lipids and Overweight  Symptoms:  DOE   Nuclear Pre-Procedure Caffeine/Decaff Intake:  9:30pm NPO After: 5:30am   IV Site: R Forearm  IV 0.9% NS with Angio Cath:  22g  Chest Size (in):  44" IV Started by: Rolene Course, RN  Height: 5\' 10"  (1.778 m)  Cup Size: n/a  BMI:  Body mass index is 29.56 kg/(m^2). Weight:  206 lb (93.441 kg)   Tech Comments:  n/a    Nuclear Med Study 1 or 2 day study: 1 day  Stress Test Type:  Stress  Order Authorizing Provider:  Lyman Bishop, MD   Resting Radionuclide: Technetium 71m Sestamibi  Resting Radionuclide Dose: 10.9 mCi   Stress Radionuclide:  Technetium 56m Sestamibi  Stress Radionuclide Dose: 32.8 mCi           Stress Protocol Rest HR: 56 Stress HR: 148  Rest BP: 136/76 Stress BP: 187/53  Exercise Time (min): 7:51 METS: 9.80   Predicted Max HR: 152 bpm % Max HR: 97.37 bpm Rate Pressure Product: [phone]  Dose of Adenosine (mg):  n/a Dose of Lexiscan: n/a mg  Dose of Atropine (mg): n/a Dose of Dobutamine: n/a mcg/kg/min (at max HR)  Stress Test Technologist: Mellody Memos, CCT Nuclear Technologist:Elizabeth Young,CNMT   Rest Procedure:  Myocardial perfusion imaging was performed at rest 45 minutes following the intravenous administration of Technetium 49m Sestamibi. Stress Procedure:  The patient performed treadmill exercise using a Bruce  Protocol for 7 minutes 51 seconds. The patient stopped due to shortness of breath. Patient denied any chest pain.  There were no significant ST-T wave changes.  Technetium 5m Sestamibi  was injected IV at peak exercise and myocardial perfusion imaging was performed after a brief delay.  Transient Ischemic Dilatation (Normal <1.22):  1.04  QGS EDV:  79 ml QGS ESV:  34 ml LV Ejection Fraction: 57%  PHYSICIAN INTERPRETATION  Rest ECG: NSR - Normal EKG and NSR with non-specific ST-T wave changes  Stress ECG: No significant change from baseline ECG and No significant ST segment change suggestive of ischemia.  QPS Raw Data Images:  Normal; no motion artifact; normal heart/lung ratio. Stress Images:  Normal homogeneous uptake in all areas of the myocardium. Rest Images:  Normal homogeneous uptake in all areas of the myocardium. Subtraction (SDS):  There is no evidence of scar or ischemia.  Impression Exercise Capacity:  Good exercise capacity. BP Response:  Normal blood pressure response. Clinical Symptoms:  There is dyspnea. ECG Impression:  No significant ST segment change suggestive of ischemia. Comparison with Prior Nuclear Study: No images to compare  Overall Impression:  Normal stress nuclear study. and Low risk stress nuclear study No ischemia or infarction.  LV Wall Motion:  NL LV Function; NL Wall Motion   Leonie Man, MD  09/25/2014 6:41 PM

## 2014-09-29 ENCOUNTER — Telehealth: Payer: Self-pay | Admitting: Internal Medicine

## 2014-09-29 NOTE — Telephone Encounter (Signed)
Pt would like stress test from last Thursday please. He is waiting to find out about him exercising please.

## 2014-09-29 NOTE — Telephone Encounter (Signed)
Routed to Dr. Debara Pickett to result on stress test

## 2014-09-30 NOTE — Telephone Encounter (Signed)
Patient notified stress test was normal

## 2014-11-23 ENCOUNTER — Other Ambulatory Visit: Payer: Self-pay | Admitting: Internal Medicine

## 2015-02-04 DIAGNOSIS — Z029 Encounter for administrative examinations, unspecified: Secondary | ICD-10-CM

## 2015-02-16 ENCOUNTER — Other Ambulatory Visit: Payer: Medicare Other | Admitting: Internal Medicine

## 2015-02-16 DIAGNOSIS — N400 Enlarged prostate without lower urinary tract symptoms: Secondary | ICD-10-CM

## 2015-02-16 DIAGNOSIS — R5383 Other fatigue: Secondary | ICD-10-CM

## 2015-02-16 DIAGNOSIS — R7309 Other abnormal glucose: Secondary | ICD-10-CM

## 2015-02-16 DIAGNOSIS — Z79899 Other long term (current) drug therapy: Secondary | ICD-10-CM

## 2015-02-16 DIAGNOSIS — E785 Hyperlipidemia, unspecified: Secondary | ICD-10-CM

## 2015-02-16 LAB — CBC WITH DIFFERENTIAL/PLATELET
Basophils Absolute: 0 10*3/uL (ref 0.0–0.1)
Basophils Relative: 0 % (ref 0–1)
Eosinophils Absolute: 0.1 10*3/uL (ref 0.0–0.7)
Eosinophils Relative: 3 % (ref 0–5)
HCT: 41.6 % (ref 39.0–52.0)
HEMOGLOBIN: 14.8 g/dL (ref 13.0–17.0)
Lymphocytes Relative: 32 % (ref 12–46)
Lymphs Abs: 1.5 10*3/uL (ref 0.7–4.0)
MCH: 32.5 pg (ref 26.0–34.0)
MCHC: 35.6 g/dL (ref 30.0–36.0)
MCV: 91.4 fL (ref 78.0–100.0)
MONOS PCT: 10 % (ref 3–12)
MPV: 9.4 fL (ref 8.6–12.4)
Monocytes Absolute: 0.5 10*3/uL (ref 0.1–1.0)
Neutro Abs: 2.6 10*3/uL (ref 1.7–7.7)
Neutrophils Relative %: 55 % (ref 43–77)
Platelets: 260 10*3/uL (ref 150–400)
RBC: 4.55 MIL/uL (ref 4.22–5.81)
RDW: 12.8 % (ref 11.5–15.5)
WBC: 4.7 10*3/uL (ref 4.0–10.5)

## 2015-02-16 LAB — COMPLETE METABOLIC PANEL WITH GFR
ALT: 27 U/L (ref 9–46)
AST: 23 U/L (ref 10–35)
Albumin: 4.3 g/dL (ref 3.6–5.1)
Alkaline Phosphatase: 57 U/L (ref 40–115)
BUN: 13 mg/dL (ref 7–25)
CO2: 25 mmol/L (ref 20–31)
CREATININE: 0.98 mg/dL (ref 0.70–1.25)
Calcium: 9.2 mg/dL (ref 8.6–10.3)
Chloride: 105 mmol/L (ref 98–110)
GFR, EST NON AFRICAN AMERICAN: 79 mL/min (ref >=60)
GFR, Est African American: >89 mL/min (ref >=60)
GLUCOSE: 96 mg/dL (ref 65–99)
POTASSIUM: 4.8 mmol/L (ref 3.5–5.3)
SODIUM: 140 mmol/L (ref 135–146)
TOTAL PROTEIN: 6.8 g/dL (ref 6.1–8.1)
Total Bilirubin: 0.6 mg/dL (ref 0.2–1.2)

## 2015-02-16 LAB — HEMOGLOBIN A1C
Hgb A1c MFr Bld: 5.9 % — ABNORMAL HIGH (ref <5.7)
MEAN PLASMA GLUCOSE: 123 mg/dL — AB (ref <117)

## 2015-02-16 LAB — LIPID PANEL
CHOL/HDL RATIO: 3.7 ratio (ref <=5.0)
CHOLESTEROL: 140 mg/dL (ref 125–200)
HDL: 38 mg/dL — AB (ref >=40)
LDL Cholesterol: 80 mg/dL (ref <130)
Triglycerides: 109 mg/dL (ref <150)
VLDL: 22 mg/dL (ref <30)

## 2015-02-17 ENCOUNTER — Telehealth: Payer: Self-pay | Admitting: *Deleted

## 2015-02-17 LAB — PSA, MEDICARE: PSA: 9.67 ng/mL — ABNORMAL HIGH (ref <=4.00)

## 2015-02-18 NOTE — Telephone Encounter (Signed)
Information from [facility] urology received

## 2015-02-19 ENCOUNTER — Encounter: Payer: Self-pay | Admitting: Internal Medicine

## 2015-02-19 ENCOUNTER — Ambulatory Visit (INDEPENDENT_AMBULATORY_CARE_PROVIDER_SITE_OTHER): Payer: Medicare Other | Admitting: Internal Medicine

## 2015-02-19 VITALS — BP 110/64 | HR 80 | Temp 98.0°F | Ht 70.0 in | Wt 207.5 lb

## 2015-02-19 DIAGNOSIS — H409 Unspecified glaucoma: Secondary | ICD-10-CM

## 2015-02-19 DIAGNOSIS — J309 Allergic rhinitis, unspecified: Secondary | ICD-10-CM

## 2015-02-19 DIAGNOSIS — E786 Lipoprotein deficiency: Secondary | ICD-10-CM | POA: Diagnosis not present

## 2015-02-19 DIAGNOSIS — Z0000 Encounter for general adult medical examination without abnormal findings: Secondary | ICD-10-CM | POA: Diagnosis not present

## 2015-02-19 DIAGNOSIS — I519 Heart disease, unspecified: Secondary | ICD-10-CM

## 2015-02-19 DIAGNOSIS — I255 Ischemic cardiomyopathy: Secondary | ICD-10-CM

## 2015-02-19 DIAGNOSIS — R972 Elevated prostate specific antigen [PSA]: Secondary | ICD-10-CM

## 2015-02-19 DIAGNOSIS — N400 Enlarged prostate without lower urinary tract symptoms: Secondary | ICD-10-CM

## 2015-02-19 DIAGNOSIS — G4730 Sleep apnea, unspecified: Secondary | ICD-10-CM

## 2015-02-19 DIAGNOSIS — E785 Hyperlipidemia, unspecified: Secondary | ICD-10-CM

## 2015-02-19 DIAGNOSIS — R7302 Impaired glucose tolerance (oral): Secondary | ICD-10-CM

## 2015-02-19 DIAGNOSIS — I10 Essential (primary) hypertension: Secondary | ICD-10-CM | POA: Diagnosis not present

## 2015-02-19 DIAGNOSIS — Z951 Presence of aortocoronary bypass graft: Secondary | ICD-10-CM

## 2015-02-19 LAB — POCT URINALYSIS DIPSTICK
Bilirubin, UA: NEGATIVE
Blood, UA: NEGATIVE
Glucose, UA: NEGATIVE
Ketones, UA: NEGATIVE
LEUKOCYTES UA: NEGATIVE
NITRITE UA: NEGATIVE
PROTEIN UA: NEGATIVE
Spec Grav, UA: 1.01
UROBILINOGEN UA: NEGATIVE
pH, UA: 6

## 2015-02-19 NOTE — Progress Notes (Signed)
Subjective:    Patient ID: Keith Phelps, male    DOB: 12-02-1945, 69 y.o.   MRN: [id number]  HPI 69 year old White Male  for health maintenance exam  and evaluation of medical issues. He has a history of hypertension and ischemic cardiomyopathy as well as hyperlipidemia. He is status post coronary artery bypass graft surgery 4 in 2002. He's followed by Cardiologist. History of sleep apnea and uses CPAP.  Social history: His fiance is [name]. They live together. He does not smoke or consume alcohol. He retired in May 2011. 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  Family history: History of stroke and MI in his father.  Past medical history: Cholecystectomy and appendectomy in 1976. History of BPH for which he takes Flomax. History of allergic rhinitis since moving to [place]. Has chronic stuffy nose and has used steroid nasal spray and the past but did not think it helped much.  He saw urologist Dr. Jeffie Pollock May 2016. History of BPH and elevated PSA. His PSA in May was 9.09. Free PSA was 1.59. He is seen every 6 months for elevated PSA by urologist.  Review of Systems     Objective:   Physical Exam  Constitutional: He is oriented to person, place, and time. He appears well-developed and well-nourished. No distress.  HENT:  Head: Normocephalic and atraumatic.  Right Ear: External ear normal.  Left Ear: External ear normal.  Mouth/Throat: Oropharynx is clear and moist. No oropharyngeal exudate.  Eyes: Conjunctivae and EOM are normal. Pupils are equal, round, and reactive to light. Right eye exhibits no discharge. Left eye exhibits no discharge. No scleral icterus.  Neck: Neck supple. No JVD present. No thyromegaly present.  Cardiovascular: Normal rate, regular rhythm, normal heart sounds and intact distal pulses.   No murmur heard. Pulmonary/Chest: Effort normal and breath sounds normal. No respiratory distress. He has no wheezes. He has no rales.  Abdominal: Soft. Bowel  sounds are normal. He exhibits no distension and no mass. There is no tenderness. There is no rebound and no guarding.  Genitourinary:  Deferred to urology  Musculoskeletal: He exhibits no edema.  Lymphadenopathy:    He has no cervical adenopathy.  Neurological: He is alert and oriented to person, place, and time. He has normal reflexes. He displays normal reflexes. No cranial nerve deficit. Coordination normal.  Skin: Skin is warm and dry. No rash noted. He is not diaphoretic.  Psychiatric: He has a normal mood and affect. His behavior is normal. Judgment and thought content normal.  Vitals reviewed.         Assessment & Plan:  Essential hypertension-stable on current regimen blood pressure excellent at 110/64  Hyperlipidemia-treated with statin  Coronary artery disease-followed by cardiologist  Sleep apnea-uses CPAP  Ischemic cardiomyopathy-followed by cardiologist  Elevated PSA-followed by urologist. PSA done recently 9.67. Fax results to urologist  BPH  Allergic rhinitis-does not think steroid nasal spray has helped very much  Impaired glucose tolerance-continue diet and exercise efforts    Plan: Return in one year or as needed.  Subjective:   Patient presents for Medicare Annual/Subsequent preventive examination.  Review Past Medical/Family/Social: See above  Risk Factors  Current exercise habits: Discussed Dietary issues discussed: Low fat low carbohydrate  Cardiac risk factors: Family history, history of CABG, hypertension, hyperlipidemia  Depression Screen  (Note: if answer to either of the following is "Yes", a more complete depression screening is indicated)   Over the past two weeks, have you felt  down, depressed or hopeless? No  Over the past two weeks, have you felt little interest or pleasure in doing things? No Have you lost interest or pleasure in daily life? No Do you often feel hopeless? No Do you cry easily over simple problems? No    Activities of Daily Living  In your present state of health, do you have any difficulty performing the following activities?:   Driving? No  Managing money? No  Feeding yourself? No  Getting from bed to chair? No  Climbing a flight of stairs? No  Preparing food and eating?: No  Bathing or showering? No  Getting dressed: No  Getting to the toilet? No  Using the toilet:No  Moving around from place to place: No  In the past year have you fallen or had a near fall?:No  Are you sexually active? yes Do you have more than one partner? No   Hearing Difficulties: No  Do you often ask people to speak up or repeat themselves? No  Do you experience ringing or noises in your ears? No  Do you have difficulty understanding soft or whispered voices? No  Do you feel that you have a problem with memory? No Do you often misplace items? No    Home Safety:  Do you have a smoke alarm at your residence? Yes Do you have grab bars in the bathroom? No Do you have throw rugs in your house? No   Cognitive Testing  Alert? Yes Normal Appearance?Yes  Oriented to person? Yes Place? Yes  Time? Yes  Recall of three objects? Yes  Can perform simple calculations? Yes  Displays appropriate judgment?Yes  Can read the correct time from a watch face?Yes   List the Names of Other Physician/Practitioners you currently use:  See referral list for the physicians patient is currently seeing.  Cardiology  Urology   Review of Systems: See above   Objective:     General appearance: Appears stated age and mildly obese  Head: Normocephalic, without obvious abnormality, atraumatic  Eyes: conj clear, EOMi PEERLA  Ears: normal TM's and external ear canals both ears  Nose: Nares normal. Septum midline. Mucosa normal. No drainage or sinus tenderness.  Throat: lips, mucosa, and tongue normal; teeth and gums normal  Neck: no adenopathy, no carotid bruit, no JVD, supple, symmetrical, trachea midline and  thyroid not enlarged, symmetric, no tenderness/mass/nodules  No CVA tenderness.  Lungs: clear to auscultation bilaterally  Breasts: normal appearance, no masses or tenderness Heart: regular rate and rhythm, S1, S2 normal, no murmur, click, rub or gallop  Abdomen: soft, non-tender; bowel sounds normal; no masses, no organomegaly  Musculoskeletal: ROM normal in all joints, no crepitus, no deformity, Normal muscle strengthen. Back  is symmetric, no curvature. Skin: Skin color, texture, turgor normal. No rashes or lesions  Lymph nodes: Cervical, supraclavicular, and axillary nodes normal.  Neurologic: CN 2 -12 Normal, Normal symmetric reflexes. Normal coordination and gait  Psych: Alert & Oriented x 3, Mood appear stable.    Assessment:    Annual wellness medicare exam   Plan:    During the course of the visit the patient was educated and counseled about appropriate screening and preventive services including:   Annual flu vaccine  Prevnar vaccine     Patient Instructions (the written plan) was given to the patient.  Medicare Attestation  I have personally reviewed:  The patient's medical and social history  Their use of alcohol, tobacco or illicit drugs  Their current medications and  supplements  The patient's functional ability including ADLs,fall risks, home safety risks, cognitive, and hearing and visual impairment  Diet and physical activities  Evidence for depression or mood disorders  The patient's weight, height, BMI, and visual acuity have been recorded in the chart. I have made referrals, counseling, and provided education to the patient based on review of the above and I have provided the patient with a written personalized care plan for preventive services.

## 2015-03-02 ENCOUNTER — Telehealth: Payer: Self-pay | Admitting: Internal Medicine

## 2015-03-02 NOTE — Telephone Encounter (Signed)
I recommend Dr. Benjamine Mola (ENT) - he is on [facility] as well, next door to [facility] - much easier to get in and he sees patients in [place] as well once weekly.  Dr. Lemmie Evens

## 2015-03-02 NOTE — Telephone Encounter (Signed)
Pt would like for you to recommend a good ENT doctor please.

## 2015-03-02 NOTE — Telephone Encounter (Signed)
Spoke to patient, gave recommendation - pt voiced understanding, thanks for the return call.

## 2015-03-02 NOTE — Telephone Encounter (Signed)
Returned call to patient. Offered number for [facility] and Throat. Unsure if he needs referral- he wants to follow up about cerumen build-up and nasal congestion assoc w/ CPAP use. He used to see an ENT annually in the past but this has been several years. Pt took number for practice but will wait, wants to know if a particular physician recommended. Will route to see if Dr. Debara Pickett has recommendation.

## 2015-03-11 ENCOUNTER — Telehealth: Payer: Self-pay | Admitting: Internal Medicine

## 2015-03-11 NOTE — Telephone Encounter (Signed)
Patient called in because he is trying to get long term life insurance and was classified as level 1 r/t coronary disease and diabetes which he reports he does not. Informed him this is NOT listed in his problem list in our computer system but that if he was, this would be addressed by his PCP, not Dr. Debara Pickett. He voiced understanding of this. He states he was classified as diabetic 08/2012 but then exercised and his A1C improved and his PCP should have removed this diagnosis from the list.   Patient states he may need Dr. Debara Pickett to write an appeal level r/t his level 1 classification for him to get this type of insurance. He will call back if this is necessary

## 2015-03-11 NOTE — Telephone Encounter (Signed)
Calling because he has a question to whether he is a diabetic or not . Please call   Thanks

## 2015-03-23 ENCOUNTER — Telehealth: Payer: Self-pay | Admitting: Internal Medicine

## 2015-03-23 DIAGNOSIS — Z029 Encounter for administrative examinations, unspecified: Secondary | ICD-10-CM

## 2015-03-23 NOTE — Telephone Encounter (Signed)
I sent Medical Records for long term life insurance for him.  Back in 2014 he had (09/06/12), A1C was elevated and he was diagnosed with Type II DM.  He changed his diet and exercised and was able to bring his A1C down significantly and you changed the diagnosis to impaired glucose intolerance and no longer TYPE II.    Now the insurance company is telling him if he has a letter from you, the physician, stating that his diagnosis is NOT diabetes, they will not drop the rate of his insurance premium rather charging him the higher premium of his initial quote upon reviewing his medical records where it states that he was diagnosed with TYPE II Diabetes in 2014.    Are you willing to do this for him?  Please advise.  Thanks.

## 2015-03-23 NOTE — Telephone Encounter (Signed)
I can only say that his Hgb AIC has improved from 6.1% to 5.9 %. This is a Research officer, trade union between pt and insurance company.

## 2015-03-23 NOTE — Telephone Encounter (Signed)
Ok to provide a letter or most recent office note attesting to this.  Dr. Lemmie Evens

## 2015-03-23 NOTE — Telephone Encounter (Signed)
Pt needs a letter for his insurance company. It needs to state that he does have CAD,but it is under under control and it is managed.

## 2015-03-23 NOTE — Telephone Encounter (Signed)
Routing to Dr. Debara Pickett for approval to draft letter for patient to give to his insurance company

## 2015-03-25 NOTE — Telephone Encounter (Signed)
Letter and office note composed for insurance company stating he has CAF and is under control and managed  Patient to pick up today at front desk

## 2015-03-27 NOTE — Telephone Encounter (Signed)
Letter was provided for patient.  Patient was contacted today to advise that letter has been done.  He may come and pick up letter to send to his insurance company with attached lab documentation.  Documentation has been scanned for our records as well.

## 2015-04-24 ENCOUNTER — Ambulatory Visit (INDEPENDENT_AMBULATORY_CARE_PROVIDER_SITE_OTHER): Payer: Medicare Other | Admitting: Internal Medicine

## 2015-04-24 VITALS — Temp 97.9°F

## 2015-04-24 DIAGNOSIS — Z23 Encounter for immunization: Secondary | ICD-10-CM | POA: Diagnosis not present

## 2015-05-04 ENCOUNTER — Other Ambulatory Visit: Payer: Self-pay | Admitting: Internal Medicine

## 2015-06-06 ENCOUNTER — Encounter: Payer: Self-pay | Admitting: Internal Medicine

## 2015-06-06 DIAGNOSIS — E786 Lipoprotein deficiency: Secondary | ICD-10-CM | POA: Insufficient documentation

## 2015-06-06 DIAGNOSIS — H409 Unspecified glaucoma: Secondary | ICD-10-CM | POA: Insufficient documentation

## 2015-06-06 NOTE — Patient Instructions (Signed)
Continue diet and exercise efforts. Continue same medications. Return in one year or as needed. Continue to see urologist and cardiologist.

## 2015-06-10 ENCOUNTER — Other Ambulatory Visit: Payer: Self-pay | Admitting: Urology

## 2015-06-10 DIAGNOSIS — R972 Elevated prostate specific antigen [PSA]: Secondary | ICD-10-CM

## 2015-06-26 ENCOUNTER — Ambulatory Visit (HOSPITAL_COMMUNITY)
Admission: RE | Admit: 2015-06-26 | Discharge: 2015-06-26 | Disposition: A | Discharge status: Home / Self Care | Payer: Medicare Other | Source: Ambulatory Visit | Attending: Urology | Admitting: Urology

## 2015-06-26 DIAGNOSIS — R972 Elevated prostate specific antigen [PSA]: Secondary | ICD-10-CM | POA: Insufficient documentation

## 2015-06-26 LAB — POCT I-STAT CREATININE: Creatinine, Ser: 1 mg/dL (ref 0.61–1.24)

## 2015-06-26 MED ORDER — GADOBENATE DIMEGLUMINE 529 MG/ML IV SOLN
20.0000 mL | Freq: Once | INTRAVENOUS | Status: AC | PRN
  Administered 2015-06-26: 19 mL via INTRAVENOUS

## 2015-07-27 DIAGNOSIS — R972 Elevated prostate specific antigen [PSA]: Secondary | ICD-10-CM | POA: Diagnosis not present

## 2015-07-27 DIAGNOSIS — N401 Enlarged prostate with lower urinary tract symptoms: Secondary | ICD-10-CM | POA: Diagnosis not present

## 2015-07-27 DIAGNOSIS — R3915 Urgency of urination: Secondary | ICD-10-CM | POA: Diagnosis not present

## 2015-07-27 DIAGNOSIS — N138 Other obstructive and reflux uropathy: Secondary | ICD-10-CM | POA: Diagnosis not present

## 2015-07-27 DIAGNOSIS — Z0000 Encounter for general adult medical examination without abnormal findings: Secondary | ICD-10-CM | POA: Diagnosis not present

## 2015-08-15 ENCOUNTER — Other Ambulatory Visit: Payer: Self-pay | Admitting: Internal Medicine

## 2015-08-17 NOTE — Telephone Encounter (Signed)
Rx(s) sent to pharmacy electronically.  

## 2015-09-04 DIAGNOSIS — G4733 Obstructive sleep apnea (adult) (pediatric): Secondary | ICD-10-CM | POA: Diagnosis not present

## 2015-09-07 DIAGNOSIS — H5213 Myopia, bilateral: Secondary | ICD-10-CM | POA: Diagnosis not present

## 2015-09-07 DIAGNOSIS — H2513 Age-related nuclear cataract, bilateral: Secondary | ICD-10-CM | POA: Diagnosis not present

## 2015-09-07 DIAGNOSIS — H40013 Open angle with borderline findings, low risk, bilateral: Secondary | ICD-10-CM | POA: Diagnosis not present

## 2015-09-07 DIAGNOSIS — H04123 Dry eye syndrome of bilateral lacrimal glands: Secondary | ICD-10-CM | POA: Diagnosis not present

## 2015-09-07 DIAGNOSIS — H43813 Vitreous degeneration, bilateral: Secondary | ICD-10-CM | POA: Diagnosis not present

## 2015-09-07 DIAGNOSIS — H524 Presbyopia: Secondary | ICD-10-CM | POA: Diagnosis not present

## 2015-09-09 ENCOUNTER — Telehealth: Payer: Self-pay | Admitting: Cardiovascular Disease

## 2015-09-09 NOTE — Telephone Encounter (Signed)
New message     Pt need to change suppliers of his cpap equipment.  His ins says change to [facility].  [facility].  Please call and let him know how to do that

## 2015-09-17 NOTE — Telephone Encounter (Signed)
Left message for patient that he does not need to do anything to change MDE companies for hid CPAP. I will take care of this for him. If he has not heard from [facility] within a week he should call back to notify.

## 2015-09-17 NOTE — Telephone Encounter (Signed)
Spoke with Ivin Booty with [facility]. She informed me that she will be sending the patients information and referral to [facility] tomorrow.

## 2015-09-20 ENCOUNTER — Other Ambulatory Visit: Payer: Self-pay | Admitting: Internal Medicine

## 2015-10-05 ENCOUNTER — Telehealth: Payer: Self-pay | Admitting: Cardiovascular Disease

## 2015-10-05 NOTE — Telephone Encounter (Signed)
°  Follow Up   Pt calling following up on changes made to sleep apnea supplier. Please call.

## 2015-10-12 ENCOUNTER — Other Ambulatory Visit: Payer: Self-pay | Admitting: *Deleted

## 2015-10-12 DIAGNOSIS — Z9989 Dependence on other enabling machines and devices: Principal | ICD-10-CM

## 2015-10-12 DIAGNOSIS — G4733 Obstructive sleep apnea (adult) (pediatric): Secondary | ICD-10-CM

## 2015-10-19 ENCOUNTER — Other Ambulatory Visit: Payer: Self-pay | Admitting: Internal Medicine

## 2015-10-19 NOTE — Telephone Encounter (Signed)
REFILL 

## 2015-10-21 NOTE — Telephone Encounter (Signed)
Orders to manage CPAP and supplies sent in to [facility].

## 2015-10-30 ENCOUNTER — Encounter: Payer: Self-pay | Admitting: Internal Medicine

## 2015-10-30 ENCOUNTER — Ambulatory Visit (INDEPENDENT_AMBULATORY_CARE_PROVIDER_SITE_OTHER): Payer: PPO | Admitting: Internal Medicine

## 2015-10-30 VITALS — BP 118/60 | HR 69 | Ht 70.0 in | Wt 205.0 lb

## 2015-10-30 DIAGNOSIS — I10 Essential (primary) hypertension: Secondary | ICD-10-CM | POA: Diagnosis not present

## 2015-10-30 DIAGNOSIS — I2510 Atherosclerotic heart disease of native coronary artery without angina pectoris: Secondary | ICD-10-CM

## 2015-10-30 DIAGNOSIS — E785 Hyperlipidemia, unspecified: Secondary | ICD-10-CM

## 2015-10-30 DIAGNOSIS — Z951 Presence of aortocoronary bypass graft: Secondary | ICD-10-CM

## 2015-10-30 DIAGNOSIS — I2583 Coronary atherosclerosis due to lipid rich plaque: Secondary | ICD-10-CM

## 2015-10-30 DIAGNOSIS — E8881 Metabolic syndrome: Secondary | ICD-10-CM | POA: Diagnosis not present

## 2015-10-30 NOTE — Progress Notes (Signed)
OFFICE NOTE  Chief Complaint:  No complaints  Primary Care Physician: Elby Showers, MD  HPI:  Keith Phelps is a 70 year old gentleman with history of coronary disease and CABG in 2002, LIMA to the LAD, SVG to RCA and SVG to ramus and an SVG to the OM. His angiogram in 2006 showed patent vein grafts. He has done well without any symptoms. He is able to exercise without any chest pain or worsening shortness of breath. 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 He also has dyslipidemia and hypertension.  He was recently diagnosed with borderline diabetes now diet controlled. He has made major changes in his diet and exercise to combat this. He reports over the last year he said problems with his shoulders and had arthroscopic surgery on the right shoulder. He been undergoing rehabilitation on and off. He has recently been missing some doses of his TriCor. We recently obtained a lipid profile that showed his total cholesterol 154, triglycerides 147, HDL 40 and LDL 85.  I saw Keith Phelps back in the office today. He is currently without complaints. Unfortunately he's gained some weight and stopped doing so his exercise. He denies any chest pain or significant worsening shortness of breath. His last nuclear stress test was in 2011 and his bypass was in 2002. He's had good cholesterol control and recent laboratory work continues to show his cholesterol is at goal. Unfortunately his A1c is borderline elevated at 5.9 with a fasting glucose of 123. The rest of his laboratory work is within normal limits.  Keith Phelps returns today for follow-up. Overall he is doing well, however he has gained some additional weight. Today he is 205 pounds. Most of this is abdominal weight. He reports less exercise. 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 He does some short sprint type walking but has significantly decreased his exercise.  His last cholesterol testing was well controlled in August however A1c continues to creep up. He is not currently on any oral antidiabetic medications. Blood pressures controlled today. He denies chest pain or shortness of breath.  PMHx:  Past Medical History  Diagnosis Date  . Sleep apnea   . Coronary artery disease   . Hyperlipidemia     Past Surgical History  Procedure Laterality Date  . Cholecystectomy    . Appendectomy    . Doppler echocardiography  02/21/2012    EF > 55%  . Doppler echocardiography  03/16/2010    EF 45-50%  . Nm myoview ltd  03/16/2010    EF 64%  Low risk study  . Cardiac catheterization  06/13/2005    [facility]  EF 55%  . Coronary artery bypass graft  07/18/2000    FAMHx:  Family History  Problem Relation Age of Onset  . Stroke Father   . Heart disease Father     SOCHx:   reports that he has never smoked. He has never used smokeless tobacco. He reports that he does not drink alcohol or use illicit drugs.  ALLERGIES:  Allergies  Allergen Reactions  . Doxycycline Rash    ROS: Pertinent items noted in HPI and remainder of comprehensive ROS otherwise negative.  HOME MEDS: Current Outpatient Prescriptions  Medication Sig Dispense Refill  . alfuzosin (UROXATRAL) 10 MG 24 hr tablet Take 10 mg by mouth daily.  11  . aspirin 162 MG EC tablet Take 162 mg  by mouth daily.      Marland Kitchen atorvastatin (LIPITOR) 40 MG tablet Take 1 tablet (40 mg total) by mouth daily. PATIENT NEEDS TO CONTACT OFFICE FOR ADDITIONAL REFILLS 90 tablet 0  . b complex vitamins tablet Take 1 tablet by mouth daily.    . fenofibrate (TRICOR) 145 MG tablet Take 1 tablet (145 mg total) by mouth daily. NEED OV. 30 tablet 0  . hydrocortisone 1 % ointment Apply 1 application topically as needed for itching.    . Multiple Vitamin (MULTIVITAMIN) tablet Take 1 tablet by mouth daily.    . Multiple Vitamins-Minerals (PRESERVISION AREDS 2 PO) Take 1 tablet by mouth daily.    . ramipril (ALTACE)  2.5 MG capsule Take 1 capsule (2.5 mg total) by mouth daily. PATIENT NEEDS TO CONTACT OFFICE FOR ADDITIONAL REFILLS 90 capsule 0  . vitamin C (ASCORBIC ACID) 500 MG tablet Take 500 mg by mouth daily.     No current facility-administered medications for this visit.    LABS/IMAGING: No results found for this or any previous visit (from the past 48 hour(s)). No results found.  VITALS: BP 118/60 mmHg  Pulse 69  Ht 5\' 10"  (1.778 m)  Wt 205 lb (92.987 kg)  BMI 29.41 kg/m2  EXAM: General appearance: alert and no distress Neck: no carotid bruit, no JVD and thyroid not enlarged, symmetric, no tenderness/mass/nodules Lungs: clear to auscultation bilaterally Heart: regular rate and rhythm, S1, S2 normal, no murmur, click, rub or gallop Abdomen: soft, non-tender; bowel sounds normal; no masses,  no organomegaly Extremities: extremities normal, atraumatic, no cyanosis or edema Pulses: 2+ and symmetric Skin: Skin color, texture, turgor normal. No rashes or lesions Neurologic: Grossly normal Psych: Mood, affect normal  EKG: Normal sinus rhythm at 69  ASSESSMENT: 1. Coronary artery disease status post four-vessel CABG in 2002 2. Low risk Myoview in 09/2014 3. Dyslipidemia-well controlled 4. Hypertension-at goal 5. Borderline diabetes-diet controlled - A1c 5.9  PLAN: 1.   Keith Phelps is doing well and denies any anginal pain. His stress test last year was low risk. Blood pressures at controlled and cholesterol is at goal. He continues to have borderline diabetes and is not yet on medication. He's had recent weight gain which threatens the need for medication. I've encouraged him to do more walking at home and exercise as well as make significant dietary changes to work on weight loss to keep him from becoming diabetic. Plan follow-up annually or sooner as necessary.  Pixie Casino, MD, [facility] Attending Cardiologist Keith Phelps 10/30/2015, 10:29 AM

## 2015-10-30 NOTE — Patient Instructions (Signed)
Your physician wants you to follow-up in: 1 year with Dr. Hilty. You will receive a reminder letter in the mail two months in advance. If you don't receive a letter, please call our office to schedule the follow-up appointment.  

## 2015-11-02 ENCOUNTER — Telehealth: Payer: Self-pay | Admitting: Internal Medicine

## 2015-11-02 NOTE — Telephone Encounter (Signed)
Communicated information per Mariann Laster to patient. He verbalized understanding.

## 2015-11-02 NOTE — Telephone Encounter (Signed)
-----   Message from [place] sent at 11/02/2015 10:37 AM EDT ----- Regarding: RE: CPAP/[facility] Per Dr Evette Georges last office visit. He was instructed to follow up PRN sleep with him. ----- Message -----    From: Fidel Levy, RN    Sent: 10/30/2015  10:10 AM      To: Lauralee Evener, CMA Subject: CPAP/[facility]                        Patient was seen in office, had questions about CPAP supplier - [facility] (new)  Told him the orders were faxed on 10/21/15 per EPIC - he states he received a call from [facility] stating they had received the orders  Patient also wants to know if he will need to see Dr. Claiborne Billings every 6 months for compliance  He said he has a card in the CPAP machine that transmits his compliance data daily into [facility]

## 2015-11-09 ENCOUNTER — Telehealth: Payer: Self-pay | Admitting: Cardiovascular Disease

## 2015-11-09 NOTE — Telephone Encounter (Signed)
Pt is calling in stating that in regards to his CPAP supplies, he would rather stay with [facility] because he has not received a response from [facility] in over a month. He says he will be needing supplies within the next 3 wks. Please f/u with him   Thanks

## 2015-11-09 NOTE — Telephone Encounter (Signed)
Returned a call to patient informing him that Dr Claiborne Billings doesn't care who he uses. He can use [facility] but it will cost him more out of pocket. They are out of network for him. Patient states that he is aware of this but doesn't mind and will pay the difference because he does not like the service that he has gotten from [facility]. Number and contact name given to patient to call [facility].

## 2015-11-13 ENCOUNTER — Other Ambulatory Visit: Payer: Self-pay | Admitting: Internal Medicine

## 2015-11-13 NOTE — Telephone Encounter (Signed)
Rx request sent to pharmacy.  

## 2015-11-15 ENCOUNTER — Other Ambulatory Visit: Payer: Self-pay | Admitting: Internal Medicine

## 2015-11-16 NOTE — Telephone Encounter (Signed)
Rx Refill

## 2015-12-04 DIAGNOSIS — G4733 Obstructive sleep apnea (adult) (pediatric): Secondary | ICD-10-CM | POA: Diagnosis not present

## 2015-12-12 ENCOUNTER — Other Ambulatory Visit: Payer: Self-pay | Admitting: Internal Medicine

## 2015-12-13 ENCOUNTER — Other Ambulatory Visit: Payer: Self-pay | Admitting: Internal Medicine

## 2015-12-14 NOTE — Telephone Encounter (Signed)
Rx request sent to pharmacy.  

## 2016-01-15 DIAGNOSIS — H04123 Dry eye syndrome of bilateral lacrimal glands: Secondary | ICD-10-CM | POA: Diagnosis not present

## 2016-01-15 DIAGNOSIS — H01001 Unspecified blepharitis right upper eyelid: Secondary | ICD-10-CM | POA: Diagnosis not present

## 2016-01-15 DIAGNOSIS — H01002 Unspecified blepharitis right lower eyelid: Secondary | ICD-10-CM | POA: Diagnosis not present

## 2016-01-15 DIAGNOSIS — H40013 Open angle with borderline findings, low risk, bilateral: Secondary | ICD-10-CM | POA: Diagnosis not present

## 2016-01-20 DIAGNOSIS — R972 Elevated prostate specific antigen [PSA]: Secondary | ICD-10-CM | POA: Diagnosis not present

## 2016-01-27 DIAGNOSIS — R3915 Urgency of urination: Secondary | ICD-10-CM | POA: Diagnosis not present

## 2016-01-27 DIAGNOSIS — N401 Enlarged prostate with lower urinary tract symptoms: Secondary | ICD-10-CM | POA: Diagnosis not present

## 2016-01-27 DIAGNOSIS — R972 Elevated prostate specific antigen [PSA]: Secondary | ICD-10-CM | POA: Diagnosis not present

## 2016-01-27 DIAGNOSIS — R102 Pelvic and perineal pain: Secondary | ICD-10-CM | POA: Diagnosis not present

## 2016-02-11 ENCOUNTER — Other Ambulatory Visit: Payer: Self-pay | Admitting: Internal Medicine

## 2016-03-08 DIAGNOSIS — G4733 Obstructive sleep apnea (adult) (pediatric): Secondary | ICD-10-CM | POA: Diagnosis not present

## 2016-03-15 DIAGNOSIS — H6123 Impacted cerumen, bilateral: Secondary | ICD-10-CM | POA: Diagnosis not present

## 2016-03-15 DIAGNOSIS — H903 Sensorineural hearing loss, bilateral: Secondary | ICD-10-CM | POA: Diagnosis not present

## 2016-03-29 ENCOUNTER — Other Ambulatory Visit: Payer: PPO | Admitting: Internal Medicine

## 2016-03-29 ENCOUNTER — Other Ambulatory Visit: Payer: Self-pay | Admitting: Internal Medicine

## 2016-03-29 DIAGNOSIS — Z0000 Encounter for general adult medical examination without abnormal findings: Secondary | ICD-10-CM | POA: Diagnosis not present

## 2016-03-29 DIAGNOSIS — R7302 Impaired glucose tolerance (oral): Secondary | ICD-10-CM | POA: Diagnosis not present

## 2016-03-29 LAB — CBC WITH DIFFERENTIAL/PLATELET
BASOS PCT: 0 %
Basophils Absolute: 0 cells/uL (ref 0–200)
EOS PCT: 4 %
Eosinophils Absolute: 180 cells/uL (ref 15–500)
HEMATOCRIT: 39.3 % (ref 38.5–50.0)
Hemoglobin: 13.3 g/dL (ref 13.2–17.1)
LYMPHS PCT: 27 %
Lymphs Abs: 1215 cells/uL (ref 850–3900)
MCH: 31.7 pg (ref 27.0–33.0)
MCHC: 33.8 g/dL (ref 32.0–36.0)
MCV: 93.8 fL (ref 80.0–100.0)
MONO ABS: 360 {cells}/uL (ref 200–950)
MPV: 9.2 fL (ref 7.5–12.5)
Monocytes Relative: 8 %
Neutro Abs: 2745 cells/uL (ref 1500–7800)
Neutrophils Relative %: 61 %
PLATELETS: 256 10*3/uL (ref 140–400)
RBC: 4.19 MIL/uL — AB (ref 4.20–5.80)
RDW: 12.9 % (ref 11.0–15.0)
WBC: 4.5 10*3/uL (ref 3.8–10.8)

## 2016-03-29 LAB — COMPREHENSIVE METABOLIC PANEL
ALK PHOS: 37 U/L — AB (ref 40–115)
ALT: 26 U/L (ref 9–46)
AST: 20 U/L (ref 10–35)
Albumin: 3.9 g/dL (ref 3.6–5.1)
BILIRUBIN TOTAL: 0.6 mg/dL (ref 0.2–1.2)
BUN: 12 mg/dL (ref 7–25)
CALCIUM: 8.8 mg/dL (ref 8.6–10.3)
CO2: 23 mmol/L (ref 20–31)
CREATININE: 1.04 mg/dL (ref 0.70–1.18)
Chloride: 110 mmol/L (ref 98–110)
GLUCOSE: 89 mg/dL (ref 65–99)
Potassium: 4.5 mmol/L (ref 3.5–5.3)
SODIUM: 141 mmol/L (ref 135–146)
Total Protein: 6.4 g/dL (ref 6.1–8.1)

## 2016-03-29 LAB — LIPID PANEL
Cholesterol: 142 mg/dL (ref 125–200)
HDL: 42 mg/dL (ref >=40)
LDL CALC: 76 mg/dL (ref <130)
Total CHOL/HDL Ratio: 3.4 Ratio (ref <=5.0)
Triglycerides: 122 mg/dL (ref <150)
VLDL: 24 mg/dL (ref <30)

## 2016-03-29 LAB — PSA: PSA: 5.4 ng/mL — AB (ref <=4.0)

## 2016-04-04 ENCOUNTER — Encounter: Payer: Self-pay | Admitting: Internal Medicine

## 2016-04-04 ENCOUNTER — Ambulatory Visit (INDEPENDENT_AMBULATORY_CARE_PROVIDER_SITE_OTHER): Payer: PPO | Admitting: Internal Medicine

## 2016-04-04 VITALS — BP 130/84 | HR 69 | Temp 97.9°F | Ht 69.5 in | Wt 206.0 lb

## 2016-04-04 DIAGNOSIS — I255 Ischemic cardiomyopathy: Secondary | ICD-10-CM | POA: Diagnosis not present

## 2016-04-04 DIAGNOSIS — I519 Heart disease, unspecified: Secondary | ICD-10-CM

## 2016-04-04 DIAGNOSIS — N400 Enlarged prostate without lower urinary tract symptoms: Secondary | ICD-10-CM | POA: Diagnosis not present

## 2016-04-04 DIAGNOSIS — E785 Hyperlipidemia, unspecified: Secondary | ICD-10-CM

## 2016-04-04 DIAGNOSIS — R7302 Impaired glucose tolerance (oral): Secondary | ICD-10-CM | POA: Diagnosis not present

## 2016-04-04 DIAGNOSIS — E786 Lipoprotein deficiency: Secondary | ICD-10-CM

## 2016-04-04 DIAGNOSIS — Z951 Presence of aortocoronary bypass graft: Secondary | ICD-10-CM

## 2016-04-04 DIAGNOSIS — G4730 Sleep apnea, unspecified: Secondary | ICD-10-CM | POA: Diagnosis not present

## 2016-04-04 DIAGNOSIS — H409 Unspecified glaucoma: Secondary | ICD-10-CM | POA: Diagnosis not present

## 2016-04-04 DIAGNOSIS — R972 Elevated prostate specific antigen [PSA]: Secondary | ICD-10-CM | POA: Diagnosis not present

## 2016-04-04 DIAGNOSIS — I10 Essential (primary) hypertension: Secondary | ICD-10-CM

## 2016-04-04 DIAGNOSIS — J309 Allergic rhinitis, unspecified: Secondary | ICD-10-CM

## 2016-04-04 DIAGNOSIS — Z0000 Encounter for general adult medical examination without abnormal findings: Secondary | ICD-10-CM

## 2016-04-04 LAB — POCT URINALYSIS DIPSTICK
BILIRUBIN UA: NEGATIVE
Blood, UA: NEGATIVE
Glucose, UA: NEGATIVE
KETONES UA: NEGATIVE
LEUKOCYTES UA: NEGATIVE
Nitrite, UA: NEGATIVE
PH UA: 6.5
Protein, UA: NEGATIVE
Spec Grav, UA: <=1.005
Urobilinogen, UA: NEGATIVE

## 2016-04-04 NOTE — Patient Instructions (Signed)
Flu vaccine declined. Was a pleasure to see you today. Return in one year or as needed. Continue follow-up with cardiologist and urologist. Continue same medications. Hemoglobin A1c pending.

## 2016-04-04 NOTE — Progress Notes (Signed)
 Subjective:    Patient ID: Keith Phelps, male    DOB: 12/28/1945, 70 y.o.   MRN: [id number]  HPI  70 year old  Male for health maintenance exam and evaluation issues.Has a history of coronary artery disease followed by Cardiologist, impaired glucose tolerance, BPH followed by urologist, hyperlipidemia and hypertension. He's been working out at [employer] recently with older adults. Feels better.  History of sleep apnea and uses C Pap device.  He is status post coronary artery bypass graft surgery 4 in 2002. History of ischemic cardiomyopathy.  Family history: History of stroke and MI in his father.  Past medical history: Cholecystectomy and appendectomy 1976. Takes Flomax for BPH. History of allergic rhinitis since moving to [place]. He saw Dr. Wren May 2016 for BPH and elevated PAS a. His PSA in May 2016 was 9.09. Free PSA was 1.59. He is seen every 6 months by urologist. PSA is now in the 5 range.  History of glaucoma  Social history: His fiance is [name]. They reside together. He does not smoke or consume alcohol. He retired in May 2011. Formerly worked as a chemist. He and Kerry met through [employer].    Review of Systems has only lost 1 pound since last G her. Needs to weigh under 200 pounds. BMI is 29.98  Declines flu vaccine     Objective:   Physical Exam  Constitutional: He is oriented to person, place, and time. He appears well-developed and well-nourished. No distress.  HENT:  Head: Normocephalic and atraumatic.  Right Ear: External ear normal.  Left Ear: External ear normal.  Mouth/Throat: Oropharynx is clear and moist.  Eyes: Conjunctivae and EOM are normal. Right eye exhibits no discharge. Left eye exhibits no discharge. No scleral icterus.  Neck: Neck supple. No JVD present. No thyromegaly present.  Cardiovascular: Normal rate, regular rhythm, normal heart sounds and intact distal pulses.   No murmur heard. Pulmonary/Chest: Breath sounds  normal. No respiratory distress. He has no wheezes. He has no rales.  Abdominal: Soft. Bowel sounds are normal. He exhibits no distension and no mass. There is no rebound and no guarding.  Genitourinary:  Genitourinary Comments: Prostate exam deferred to urologist  Musculoskeletal: He exhibits no edema.  Neurological: He is alert and oriented to person, place, and time. He has normal reflexes. No cranial nerve deficit. Coordination normal.  Skin: Skin is warm and dry. No rash noted. He is not diaphoretic.  Psychiatric: He has a normal mood and affect. His behavior is normal. Thought content normal.  Vitals reviewed.         Assessment & Plan:  Normal health maintenance exam  History of coronary disease status post CABG  Ischemic cardiomyopathy followed by cardiologist  Sleep apnea-uses C Pap  Elevated PSA followed by urologist  BPH-treated with Flomax  Allergic rhinitis-does not think steroid nasal spray has helped in the past  Hyperlipidemia-treated with statin and within normal limits  Impaired glucose tolerance-continue diet and exercise. Hemoglobin A1c pending.  Health maintenance-flu vaccine declined  Plan: Return in one year or as needed and continue diet and exercise program.  Subjective:   Patient presents for Medicare Annual/Subsequent preventive examination.  Review Past Medical/Family/Social:   Risk Factors  Current exercise habits: Working out several times weekly Dietary issues discussed: Low fat low carb  Cardiac risk factors:History of coronary artery disease, hyperlipidemia, impaired glucose tolerance, hypertension, family history  Depression Screen  (Note: if answer to either of the following is "  Yes", a more complete depression screening is indicated)   Over the past two weeks, have you felt down, depressed or hopeless? No  Over the past two weeks, have you felt little interest or pleasure in doing things? No Have you lost interest or pleasure  in daily life? No Do you often feel hopeless? No Do you cry easily over simple problems? No   Activities of Daily Living  In your present state of health, do you have any difficulty performing the following activities?:   Driving? No  Managing money? No  Feeding yourself? No  Getting from bed to chair? No  Climbing a flight of stairs? No  Preparing food and eating?: No  Bathing or showering? No  Getting dressed: No  Getting to the toilet? No  Using the toilet:No  Moving around from place to place: No  In the past year have you fallen or had a near fall?:No  Are you sexually active? No  Do you have more than one partner? No   Hearing Difficulties: No  Do you often ask people to speak up or repeat themselves? No  Do you experience ringing or noises in your ears? No  Do you have difficulty understanding soft or whispered voices? No  Do you feel that you have a problem with memory? No Do you often misplace items? No    Home Safety:  Do you have a smoke alarm at your residence? Yes Do you have grab bars in the bathroom?No Do you have throw rugs in your house? No   Cognitive Testing  Alert? Yes Normal Appearance?Yes  Oriented to person? Yes Place? Yes  Time? Yes  Recall of three objects? Yes  Can perform simple calculations? Yes  Displays appropriate judgment?Yes  Can read the correct time from a watch face?Yes   List the Names of Other Physician/Practitioners you currently use:  See referral list for the physicians patient is currently seeing.  Dr. Kelly  Dr. Wrenn   Review of Systems: As above   Objective:     General appearance: Appears stated age and mildly obese  Head: Normocephalic, without obvious abnormality, atraumatic  Eyes: conj clear, EOMi PEERLA  Ears: normal TM's and external ear canals both ears  Nose: Nares normal. Septum midline. Mucosa normal. No drainage or sinus tenderness.  Throat: lips, mucosa, and tongue normal; teeth and gums normal    Neck: no adenopathy, no carotid bruit, no JVD, supple, symmetrical, trachea midline and thyroid not enlarged, symmetric, no tenderness/mass/nodules  No CVA tenderness.  Lungs: clear to auscultation bilaterally  Breasts: normal Heart: regular rate and rhythm, S1, S2 normal, no murmur, click, rub or gallop  Abdomen: soft, non-tender; bowel sounds normal; no masses, no organomegaly  Musculoskeletal: ROM normal in all joints, no crepitus, no deformity, Normal muscle strengthen. Back  is symmetric, no curvature. Skin: Skin color, texture, turgor normal. No rashes or lesions  Lymph nodes: Cervical, supraclavicular, and axillary nodes normal.  Neurologic: CN 2 -12 Normal, Normal symmetric reflexes. Normal coordination and gait  Psych: Alert & Oriented x 3, Mood appear stable.    Assessment:    Annual wellness medicare exam   Plan:    During the course of the visit the patient was educated and counseled about appropriate screening and preventive services including:   PSA every 6 months with urology exam  Declines flu vaccine     Patient Instructions (the written plan) was given to the patient.  Medicare Attestation  I have personally reviewed:    The patient's medical and social history  Their use of alcohol, tobacco or illicit drugs  Their current medications and supplements  The patient's functional ability including ADLs,fall risks, home safety risks, cognitive, and hearing and visual impairment  Diet and physical activities  Evidence for depression or mood disorders  The patient's weight, height, BMI, and visual acuity have been recorded in the chart. I have made referrals, counseling, and provided education to the patient based on review of the above and I have provided the patient with a written personalized care plan for preventive services.

## 2016-04-05 LAB — HEMOGLOBIN A1C
Hgb A1c MFr Bld: 5.6 % (ref <5.7)
Mean Plasma Glucose: 114 mg/dL

## 2016-05-17 DIAGNOSIS — H01004 Unspecified blepharitis left upper eyelid: Secondary | ICD-10-CM | POA: Diagnosis not present

## 2016-05-17 DIAGNOSIS — H01001 Unspecified blepharitis right upper eyelid: Secondary | ICD-10-CM | POA: Diagnosis not present

## 2016-05-17 DIAGNOSIS — H5213 Myopia, bilateral: Secondary | ICD-10-CM | POA: Diagnosis not present

## 2016-05-17 DIAGNOSIS — H40013 Open angle with borderline findings, low risk, bilateral: Secondary | ICD-10-CM | POA: Diagnosis not present

## 2016-05-17 DIAGNOSIS — H524 Presbyopia: Secondary | ICD-10-CM | POA: Diagnosis not present

## 2016-05-17 DIAGNOSIS — H04123 Dry eye syndrome of bilateral lacrimal glands: Secondary | ICD-10-CM | POA: Diagnosis not present

## 2016-05-17 DIAGNOSIS — H35372 Puckering of macula, left eye: Secondary | ICD-10-CM | POA: Diagnosis not present

## 2016-06-15 ENCOUNTER — Other Ambulatory Visit: Payer: Self-pay | Admitting: Cardiovascular Disease

## 2016-06-15 NOTE — Telephone Encounter (Signed)
Rx(s) sent to pharmacy electronically.  

## 2016-06-22 ENCOUNTER — Telehealth: Payer: Self-pay | Admitting: Cardiovascular Disease

## 2016-06-22 NOTE — Telephone Encounter (Signed)
Patient would like for you to send a copy of his c-pap prescription to [place]

## 2016-06-23 DIAGNOSIS — G4733 Obstructive sleep apnea (adult) (pediatric): Secondary | ICD-10-CM | POA: Diagnosis not present

## 2016-07-01 NOTE — Telephone Encounter (Signed)
Spoke with [facility] @ [facility]. This patient's information was transferred to [place] last week.

## 2016-07-20 DIAGNOSIS — R972 Elevated prostate specific antigen [PSA]: Secondary | ICD-10-CM | POA: Diagnosis not present

## 2016-08-01 DIAGNOSIS — R972 Elevated prostate specific antigen [PSA]: Secondary | ICD-10-CM | POA: Diagnosis not present

## 2016-08-01 DIAGNOSIS — R102 Pelvic and perineal pain: Secondary | ICD-10-CM | POA: Diagnosis not present

## 2016-08-01 DIAGNOSIS — R3915 Urgency of urination: Secondary | ICD-10-CM | POA: Diagnosis not present

## 2016-08-01 DIAGNOSIS — N401 Enlarged prostate with lower urinary tract symptoms: Secondary | ICD-10-CM | POA: Diagnosis not present

## 2016-09-16 DIAGNOSIS — H40013 Open angle with borderline findings, low risk, bilateral: Secondary | ICD-10-CM | POA: Diagnosis not present

## 2016-09-16 DIAGNOSIS — H01001 Unspecified blepharitis right upper eyelid: Secondary | ICD-10-CM | POA: Diagnosis not present

## 2016-09-16 DIAGNOSIS — H04123 Dry eye syndrome of bilateral lacrimal glands: Secondary | ICD-10-CM | POA: Diagnosis not present

## 2016-09-16 DIAGNOSIS — H01005 Unspecified blepharitis left lower eyelid: Secondary | ICD-10-CM | POA: Diagnosis not present

## 2016-09-16 DIAGNOSIS — H01004 Unspecified blepharitis left upper eyelid: Secondary | ICD-10-CM | POA: Diagnosis not present

## 2016-11-14 ENCOUNTER — Encounter: Payer: Self-pay | Admitting: Internal Medicine

## 2016-11-14 ENCOUNTER — Ambulatory Visit (INDEPENDENT_AMBULATORY_CARE_PROVIDER_SITE_OTHER): Payer: PPO | Admitting: Internal Medicine

## 2016-11-14 VITALS — BP 90/60 | HR 64 | Temp 97.8°F | Ht 70.0 in | Wt 205.0 lb

## 2016-11-14 DIAGNOSIS — J069 Acute upper respiratory infection, unspecified: Secondary | ICD-10-CM | POA: Diagnosis not present

## 2016-11-14 DIAGNOSIS — Z951 Presence of aortocoronary bypass graft: Secondary | ICD-10-CM

## 2016-11-14 DIAGNOSIS — I255 Ischemic cardiomyopathy: Secondary | ICD-10-CM

## 2016-11-14 DIAGNOSIS — K429 Umbilical hernia without obstruction or gangrene: Secondary | ICD-10-CM

## 2016-11-14 MED ORDER — AZITHROMYCIN 250 MG PO TABS: ORAL_TABLET | ORAL | 0 refills | Status: DC

## 2016-11-22 NOTE — Patient Instructions (Signed)
Referral to general surgery regarding hernia.

## 2016-11-22 NOTE — Progress Notes (Signed)
   Subjective:    Patient ID: Keith Phelps, male    DOB: 08/21/1945, 71 y.o.   MRN: [id number]  HPI 71 year old Male in today requesting referral to surgeon regarding small umbilical hernia. He has had it for number of years. It has not caused him pain or ever shown signs of incarceration. However, he is concerned this could happen in the future and wants to have it repaired.  Patient is under care of Dr. Shelva Majestic, Cardiologist. Patient has history of coronary artery disease, and impaired glucose tolerance.   History of sleep apnea and uses C Pap device.  History of coronary artery bypass graft surgery 4 in 2002. History of ischemic cardiomyopathy. Sees cardiologist regularly. Cardiac status is stable.  Also, he has URI symptoms today with cough and postnasal drip. No fever.    Review of Systems see above     Objective:   Physical Exam Small reducible umbilical hernia.  TMs and pharynx are clear. Neck is supple. Chest clear to auscultation without rales or wheezing.       Assessment & Plan:  Small umbilical hernia-patient request repair. Refer to general surgery.  Acute URI  Plan: Zithromax Z-PAK take 2 tablets day 1 followed by 1 tablet days 2 through 5. Surgery consultation.

## 2016-11-23 ENCOUNTER — Telehealth: Payer: Self-pay | Admitting: Internal Medicine

## 2016-11-23 NOTE — Telephone Encounter (Signed)
Patient scheduled for surgical consult with Dr. Zella Richer @ [facility] @ [address], [place].  [phone] 12-15-16, arrive @ 3:45 p.m.   Patient notified.  Dx:  Umbilical hernia.  Fax received from Dr. Bertrum Sol office on 11/22/16.

## 2016-11-25 ENCOUNTER — Encounter: Payer: Self-pay | Admitting: Internal Medicine

## 2016-11-25 ENCOUNTER — Ambulatory Visit (INDEPENDENT_AMBULATORY_CARE_PROVIDER_SITE_OTHER): Payer: PPO | Admitting: Internal Medicine

## 2016-11-25 VITALS — BP 121/65 | HR 64 | Ht 70.5 in | Wt 208.6 lb

## 2016-11-25 DIAGNOSIS — E782 Mixed hyperlipidemia: Secondary | ICD-10-CM

## 2016-11-25 DIAGNOSIS — I10 Essential (primary) hypertension: Secondary | ICD-10-CM | POA: Diagnosis not present

## 2016-11-25 DIAGNOSIS — E8881 Metabolic syndrome: Secondary | ICD-10-CM

## 2016-11-25 DIAGNOSIS — Z951 Presence of aortocoronary bypass graft: Secondary | ICD-10-CM

## 2016-11-25 DIAGNOSIS — I2510 Atherosclerotic heart disease of native coronary artery without angina pectoris: Secondary | ICD-10-CM

## 2016-11-25 NOTE — Progress Notes (Signed)
OFFICE NOTE  Chief Complaint:  No new complaints  Primary Care Physician: Elby Showers, MD  HPI:  Keith Phelps is a 71 year old gentleman with history of coronary disease and CABG in 2002, LIMA to the LAD, SVG to RCA and SVG to ramus and an SVG to the OM. His angiogram in 2006 showed patent vein grafts. He has done well without any symptoms. He is able to exercise without any chest pain or worsening shortness of breath. 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 He also has dyslipidemia and hypertension.  He was recently diagnosed with borderline diabetes now diet controlled. He has made major changes in his diet and exercise to combat this. He reports over the last year he said problems with his shoulders and had arthroscopic surgery on the right shoulder. He been undergoing rehabilitation on and off. He has recently been missing some doses of his TriCor. We recently obtained a lipid profile that showed his total cholesterol 154, triglycerides 147, HDL 40 and LDL 85.  I saw Keith Phelps back in the office today. He is currently without complaints. Unfortunately he's gained some weight and stopped doing so his exercise. He denies any chest pain or significant worsening shortness of breath. His last nuclear stress test was in 2011 and his bypass was in 2002. He's had good cholesterol control and recent laboratory work continues to show his cholesterol is at goal. Unfortunately his A1c is borderline elevated at 5.9 with a fasting glucose of 123. The rest of his laboratory work is within normal limits.  Keith Phelps returns today for follow-up. Overall he is doing well, however he has gained some additional weight. Today he is 205 pounds. Most of this is abdominal weight. He reports less exercise. 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 He does some short sprint type walking but has significantly decreased his  exercise. His last cholesterol testing was well controlled in August however A1c continues to creep up. He is not currently on any oral antidiabetic medications. Blood pressures controlled today. He denies chest pain or shortness of breath.  11/25/2016  Keith Phelps returns today for follow-up. Overall is done well the past year. Unfortunate is continued to gain some weight. He continues to play a lot of gaming and spends time on his box. He's not been very active. He has worsening abdominal girth and is actually noted to have a ventral hernia. He's apparently scheduled to see surgery for evaluation and possible repair. Hemoglobin A1c seems to have around 5.9. His cholesterol is still mildly elevated but fairly well-controlled. He denies any chest pain or worsening shortness of breath.  PMHx:  Past Medical History:  Diagnosis Date  . Coronary artery disease   . Hyperlipidemia   . Sleep apnea     Past Surgical History:  Procedure Laterality Date  . APPENDECTOMY    . CARDIAC CATHETERIZATION  06/13/2005   [facility]  EF 55%  . CHOLECYSTECTOMY    . CORONARY ARTERY BYPASS GRAFT  07/18/2000  . DOPPLER ECHOCARDIOGRAPHY  02/21/2012   EF > 55%  . DOPPLER ECHOCARDIOGRAPHY  03/16/2010   EF 45-50%  . NM MYOVIEW LTD  03/16/2010   EF 64%  Low risk study    FAMHx:  Family History  Problem Relation Age of Onset  . Stroke Father   . Heart disease Father     SOCHx:  reports that he has never smoked. He has never used smokeless tobacco. He reports that he does not drink alcohol or use drugs.  ALLERGIES:  Allergies  Allergen Reactions  . Doxycycline Rash    ROS: Pertinent items noted in HPI and remainder of comprehensive ROS otherwise negative.  HOME MEDS: Current Outpatient Prescriptions  Medication Sig Dispense Refill  . alfuzosin (UROXATRAL) 10 MG 24 hr tablet Take 10 mg by mouth daily.  11  . aspirin 162 MG EC tablet Take 162 mg by mouth daily.      Marland Kitchen atorvastatin (LIPITOR) 40 MG  tablet TAKE 1 TABLET BY MOUTH DAILY . PATIENT NEEDS TO CONTACT OFFICE FOR ADDITIONAL REFILLS 90 tablet 2  . azithromycin (ZITHROMAX) 250 MG tablet 2 po day 1 followed by one po days 2-5 6 tablet 0  . b complex vitamins tablet Take 1 tablet by mouth daily.    . fenofibrate (TRICOR) 145 MG tablet Take 1 tablet (145 mg total) by mouth daily. 30 tablet 4  . hydrocortisone 1 % ointment Apply 1 application topically as needed for itching.    . Multiple Vitamin (MULTIVITAMIN) tablet Take 1 tablet by mouth daily.    . Multiple Vitamins-Minerals (PRESERVISION AREDS 2 PO) Take 1 tablet by mouth daily.    . ramipril (ALTACE) 2.5 MG capsule Take 1 capsule (2.5 mg total) by mouth daily. 90 capsule 3  . vitamin C (ASCORBIC ACID) 500 MG tablet Take 500 mg by mouth daily.     No current facility-administered medications for this visit.     LABS/IMAGING: No results found for this or any previous visit (from the past 48 hour(s)). No results found.  VITALS: BP 121/65   Pulse 64   Ht 5' 10.5" (1.791 m)   Wt 208 lb 9.6 oz (94.6 kg)   BMI 29.51 kg/m   EXAM: General appearance: alert, no distress and mildly obese Neck: no carotid bruit and no JVD Lungs: clear to auscultation bilaterally Heart: regular rate and rhythm, S1, S2 normal, no murmur, click, rub or gallop Abdomen: soft, non-tender; bowel sounds normal; no masses,  no organomegaly Extremities: extremities normal, atraumatic, no cyanosis or edema Pulses: 2+ and symmetric Skin: Skin color, texture, turgor normal. No rashes or lesions Neurologic: Grossly normal Psych: pleasant  EKG: Sinus rhythm at 64  ASSESSMENT: 1. Coronary artery disease status post four-vessel CABG in 2002 2. Low risk Myoview in 09/2014 3. Dyslipidemia-well controlled 4. Hypertension-at goal 5. Borderline diabetes-diet controlled - A1c 5.9  PLAN: 1.   Keith Phelps seems to be doing well. He said no new complaints related to his coronary artery disease. He had a low  risk Myoview in 2016 and his CABG was in 2002. Cholesterol is at goal. He's had borderline diabetes but is not on medication. Blood pressure is at goal today as well. He has had more weight gain and is very sedentary. He spends a good majority of his day playing on his box. Of encouraged him to start to do more physical activity and less "gaming".  Follow-up annually or sooner as necessary.  Pixie Casino, MD, [facility] Attending Cardiologist [place] 11/25/2016, 5:14 PM

## 2016-11-25 NOTE — Patient Instructions (Signed)
Your physician wants you to follow-up in: 1 year with Dr. Hilty. You will receive a reminder letter in the mail two months in advance. If you don't receive a letter, please call our office to schedule the follow-up appointment.  

## 2016-11-28 ENCOUNTER — Telehealth: Payer: Self-pay | Admitting: Internal Medicine

## 2016-11-28 NOTE — Telephone Encounter (Signed)
Returned call to patient He an incident Friday night that lasted 45 mins when he had intense pain in right upper arm - chest discomfort with movement (not exertion), short of breath when he stood up/change in position (did not limit activity)  Since, he has felt dizzy - like if you get up from a chair too soon (has gone on since Friday) Patient checked his heart rate via carotid artery - said it was slow - no # available, BP not checked - he has a BP cuff Patient has had a CABGx4 - wanted to know if one of his bypasses is blocked, but he never had an MI so he is unsure what symptoms he should be wary of  Advised that MD did not comment in his note that his EKG was concerning, which is reassuring. Advised will send message to Dr. Debara Pickett for advice

## 2016-11-28 NOTE — Telephone Encounter (Signed)
Sounds like a neuropathic pain - not like angina. Would advise him to monitor for recurrent or exertional symptoms. If it does recur, we could consider repeat stress testing. Was doing well when I saw him last week.  DR. Lemmie Evens

## 2016-11-28 NOTE — Telephone Encounter (Signed)
Left detailed message with MD advice (per DPR)

## 2016-11-28 NOTE — Telephone Encounter (Signed)
New message    Patient c/o Palpitations:  High priority if patient c/o lightheadedness and shortness of breath.  1. How long have you been having palpitations? since yesterday   2. Are you currently experiencing lightheadedness and shortness of breath? Yes some lightheadness  3. Have you checked your BP and heart rate? (document readings) feels like bp is low but has not officially checked  4. Are you experiencing any other symptoms? Numbness and sharp pain in arms / unsure if it is a heart attack symptom or not

## 2016-11-30 ENCOUNTER — Telehealth: Payer: Self-pay | Admitting: Internal Medicine

## 2016-11-30 NOTE — Telephone Encounter (Signed)
Received a call from patient.He stated Dr.Hilty wanted to know if he had any more right arm pain.Stated he just had a episode of pain in right upper arm that radiated down into right elbow lasted appox 10 mins.Stated pain before lasted appox 1 hour.No chest pain.No sob.Stated pain is so severe he is unable to move his arm.B/P during the episode today right arm 135/83 left 135/72.Stated no pain at present.Advised I will send message to Dr.Hilty for advice.

## 2016-11-30 NOTE — Telephone Encounter (Signed)
10 min  Right arm pain per pt was told to call when this happens again. It is Right now.    BP   4/15pm   135/83   60hr

## 2016-12-01 NOTE — Telephone Encounter (Signed)
That doesn't sound like cardiac pain - I would advise he see his PCP about this.  Dr. Lemmie Evens

## 2016-12-01 NOTE — Telephone Encounter (Signed)
Returned call to patient no answer.Left Dr.Hilty's advice on personal voice mail. 

## 2016-12-12 DIAGNOSIS — M25511 Pain in right shoulder: Secondary | ICD-10-CM | POA: Diagnosis not present

## 2016-12-15 DIAGNOSIS — K420 Umbilical hernia with obstruction, without gangrene: Secondary | ICD-10-CM | POA: Diagnosis not present

## 2016-12-19 ENCOUNTER — Telehealth: Payer: Self-pay | Admitting: Internal Medicine

## 2016-12-19 DIAGNOSIS — M79601 Pain in right arm: Secondary | ICD-10-CM

## 2016-12-19 DIAGNOSIS — Z951 Presence of aortocoronary bypass graft: Secondary | ICD-10-CM

## 2016-12-19 DIAGNOSIS — Z01810 Encounter for preprocedural cardiovascular examination: Secondary | ICD-10-CM

## 2016-12-19 NOTE — Telephone Encounter (Signed)
-----   Message from Pixie Casino, MD sent at 12/19/2016  1:23 PM EDT ----- Regarding: Needs cardiac stress test! Sherren Mocha-  He had a low risk myoview in 09/2014. Recently he has called [place] with right arm pain - doesn't seem cardiac but has persisted. I will arrange for an exercise myoview stress test preoperatively just to make sure. Will let you know the results - hopefully this can be arranged this week.  -Mali   ----- Message ----- From: Jackolyn Confer, MD Sent: 12/15/2016   5:39 PM To: Pixie Casino, MD  Dr. Debara Pickett,  He has an umbilical hernia.  We have discussed repair under general anesthesia.  Does he need a preop stress test or any other cardiac evaluation.  Thank you,  Jackolyn Confer

## 2016-12-19 NOTE — Telephone Encounter (Signed)
Patient notified of MD recommendations He states he is starting physical therapy w/ortho tomorrow for arm pain He is agreeable to stress testing Test ordered Staff message to [facility] to arrange appt for test.

## 2016-12-20 DIAGNOSIS — M25511 Pain in right shoulder: Secondary | ICD-10-CM | POA: Diagnosis not present

## 2016-12-21 ENCOUNTER — Telehealth (HOSPITAL_COMMUNITY): Payer: Self-pay

## 2016-12-21 NOTE — Telephone Encounter (Signed)
Encounter complete. 

## 2016-12-22 ENCOUNTER — Ambulatory Visit (HOSPITAL_COMMUNITY)
Admission: RE | Admit: 2016-12-22 | Discharge: 2016-12-22 | Disposition: A | Discharge status: Home / Self Care | Payer: PPO | Source: Ambulatory Visit | Attending: Cardiology | Admitting: Cardiology

## 2016-12-22 DIAGNOSIS — Z01810 Encounter for preprocedural cardiovascular examination: Secondary | ICD-10-CM

## 2016-12-22 DIAGNOSIS — G4733 Obstructive sleep apnea (adult) (pediatric): Secondary | ICD-10-CM | POA: Diagnosis not present

## 2016-12-22 DIAGNOSIS — Z951 Presence of aortocoronary bypass graft: Secondary | ICD-10-CM | POA: Diagnosis not present

## 2016-12-22 DIAGNOSIS — M79601 Pain in right arm: Secondary | ICD-10-CM

## 2016-12-22 LAB — MYOCARDIAL PERFUSION IMAGING
CHL CUP NUCLEAR SDS: 0
CHL CUP RESTING HR STRESS: 56 {beats}/min
Estimated workload: 10.1 METS
Exercise duration (min): 8 min
Exercise duration (sec): 0 s
LV sys vol: 45 mL
LVDIAVOL: 94 mL (ref 62–150)
MPHR: 150 {beats}/min
Peak HR: 141 {beats}/min
Percent HR: 94 %
RPE: 18
SRS: 2
SSS: 2
TID: 1.11

## 2016-12-22 MED ORDER — TECHNETIUM TC 99M TETROFOSMIN IV KIT
32.0000 | PACK | Freq: Once | INTRAVENOUS | Status: AC | PRN
  Administered 2016-12-22: 32 via INTRAVENOUS
  Filled 2016-12-22: qty 32

## 2016-12-22 MED ORDER — TECHNETIUM TC 99M TETROFOSMIN IV KIT
10.7000 | PACK | Freq: Once | INTRAVENOUS | Status: AC | PRN
  Administered 2016-12-22: 10.7 via INTRAVENOUS
  Filled 2016-12-22: qty 11

## 2016-12-27 ENCOUNTER — Ambulatory Visit: Payer: Self-pay | Admitting: General Surgery

## 2016-12-27 DIAGNOSIS — M25511 Pain in right shoulder: Secondary | ICD-10-CM | POA: Diagnosis not present

## 2016-12-27 NOTE — H&P (Signed)
Keith Phelps 12/15/2016 4:18 PM Location: [facility] Patient #: [id number] DOB: [birth year] Single / Language: Keith Phelps / Race: White Male   History of Present Illness Keith Hollingshead MD; 12/15/2016 5:36 PM) The patient is a 71 year old male.  Note:He is referred by Dr. Renold Phelps for consultation regarding an umbilical hernia. He is other hernia for some time and he thinks it may be a little larger. He states it doesn't cause him any pain. He does have to strain a little bit to urinate. He occasionally has constipation. He has no obstructive symptoms. He is interested in having the hernia repaired.  His past medical history is notable for coronary artery disease and obstructive sleep apnea-uses CPAP. He had coronary artery bypass many years ago. Dr. Debara Phelps is his cardiologist.  Past Surgical History Keith Phelps, [place]; 12/15/2016 4:18 PM) Carotid Artery Surgery  Left. Coronary Artery Bypass Graft  Gallbladder Surgery - Open  Oral Surgery   Diagnostic Studies History Keith Phelps, [place]; 12/15/2016 4:18 PM) Colonoscopy  1-5 years ago  Allergies Keith Phelps, [place]; 12/15/2016 4:20 PM) Doxycycline Hyclate *TETRACYCLINES*  Allergies Reconciled   Medication History Keith Phelps, Phelps; 12/15/2016 4:21 PM) Alfuzosin HCl ER (10MG  Tablet ER 24HR, Oral) Active. Ramipril (2.5MG  Capsule, Oral) Active. Trilipix (Oral) Specific strength unknown - Active. Fenofibrate (145MG  Tablet, Oral) Active. Atorvastatin Calcium (40MG  Tablet, Oral) Active. Nystatin (100000 UNIT/GM Powder, External) Active. Medications Reconciled  Social History Keith Phelps, [place]; 12/15/2016 4:18 PM) Caffeine use  Carbonated beverages, Tea. No alcohol use  No drug use  Tobacco use  Never smoker.  Family History Keith Phelps, [place]; 12/15/2016 4:18 PM) Cerebrovascular Accident  Father.  Other Problems Keith Phelps, [place]; 12/15/2016 4:18 PM) Cholelithiasis  Enlarged Prostate  Other disease, cancer,  significant illness  Sleep Apnea     Review of Systems Keith Phelps; 12/15/2016 4:18 PM) General Not Present- Appetite Loss, Chills, Fatigue, Fever, Night Sweats, Weight Gain and Weight Loss. Skin Not Present- Change in Wart/Mole, Dryness, Hives, Jaundice, New Lesions, Non-Healing Wounds, Rash and Ulcer. HEENT Present- Wears glasses/contact lenses. Not Present- Earache, Hearing Loss, Hoarseness, Nose Bleed, Oral Ulcers, Ringing in the Ears, Seasonal Allergies, Sinus Pain, Sore Throat, Visual Disturbances and Yellow Eyes. Respiratory Not Present- Bloody sputum, Chronic Cough, Difficulty Breathing, Snoring and Wheezing. Breast Not Present- Breast Mass, Breast Pain, Nipple Discharge and Skin Changes. Cardiovascular Not Present- Chest Pain, Difficulty Breathing Lying Down, Leg Cramps, Palpitations, Rapid Heart Rate, Shortness of Breath and Swelling of Extremities. Gastrointestinal Not Present- Abdominal Pain, Bloating, Bloody Stool, Change in Bowel Habits, Chronic diarrhea, Constipation, Difficulty Swallowing, Excessive gas, Gets full quickly at meals, Hemorrhoids, Indigestion, Nausea, Rectal Pain and Vomiting. Male Genitourinary Not Present- Blood in Urine, Change in Urinary Stream, Frequency, Impotence, Nocturia, Painful Urination, Urgency and Urine Leakage.  Vitals Keith Phelps; 12/15/2016 4:19 PM) 12/15/2016 4:19 PM Weight: 203 lb Height: 70.5in Body Surface Area: 2.11 m Body Mass Index: 28.72 kg/m  Temp.: 98.7F  Pulse: 70 (Regular)  P.OX: 98% (Room air) BP: 130/72 (Sitting, Left Arm, Standard)       Physical Exam Keith Hollingshead MD; 12/15/2016 5:41 PM) The physical exam findings are as follows: Note:GENERAL APPEARANCE: Overweight male (mostly in abdomen) in NAD. Pleasant and cooperative.  EARS, NOSE, MOUTH THROAT: [name] external ears: no lesions or deformities external nose: no lesions or deformities hearing: grossly normal lips: moist, no  deformities EYES external: conjunctiva, lids, sclerae normal pupils: equal, round  NECK: Supple, no obvious mass or thyroid mass/enlargement,  no trachea deviation  CV ascultation: RRR, no murmur extremity edema: no extremity varicosities: no carotid bruit: no  RESP/CHEST auscultation: breath sounds equal and clear respiratory effort: normal Keith Phelps 12/15/2016 4:18 PM Location: [facility] Patient #: [id number] DOB: 1945/10/10 Single / Language: Keith Phelps / Race: White Male   History of Present Illness Keith Hollingshead MD; 12/15/2016 5:36 PM) The patient is a 71 year old male.  Note:He is referred by Dr. Renold Phelps for consultation regarding an umbilical hernia. He is other hernia for some time and he thinks it may be a little larger. He states it doesn't cause him any pain. He does have to strain a little bit to urinate. He occasionally has constipation. He has no obstructive symptoms. He is interested in having the hernia repaired.  His past medical history is notable for coronary artery disease and obstructive sleep apnea-uses CPAP. He had coronary artery bypass many years ago. Dr. Debara Phelps is his cardiologist.  Past Surgical History Keith Phelps, [place]; 12/15/2016 4:18 PM) Carotid Artery Surgery  Left. Coronary Artery Bypass Graft  Gallbladder Surgery - Open  Oral Surgery   Diagnostic Studies History Keith Phelps, [place]; 12/15/2016 4:18 PM) Colonoscopy  1-5 years ago  Allergies Keith Phelps, [place]; 12/15/2016 4:20 PM) Doxycycline Hyclate *TETRACYCLINES*  Allergies Reconciled   Medication History Keith Phelps, Phelps; 12/15/2016 4:21 PM) Alfuzosin HCl ER (10MG  Tablet ER 91YN, Oral) Active. Ramipril (2.5MG  Capsule, Oral) Active. Trilipix (Oral) Specific strength unknown - Active. Fenofibrate (145MG  Tablet, Oral) Active. Atorvastatin Calcium (40MG  Tablet, Oral) Active. Nystatin (100000 UNIT/GM Powder, External) Active. Medications Reconciled  Social  History Keith Phelps, [place]; 12/15/2016 4:18 PM) Caffeine use  Carbonated beverages, Tea. No alcohol use  No drug use  Tobacco use  Never smoker.  Family History Keith Phelps, [place]; 12/15/2016 4:18 PM) Cerebrovascular Accident  Father.  Other Problems Keith Phelps, [place]; 12/15/2016 4:18 PM) Cholelithiasis  Enlarged Prostate  Other disease, cancer, significant illness  Sleep Apnea     Review of Systems Keith Phelps; 12/15/2016 4:18 PM) General Not Present- Appetite Loss, Chills, Fatigue, Fever, Night Sweats, Weight Gain and Weight Loss. Skin Not Present- Change in Wart/Mole, Dryness, Hives, Jaundice, New Lesions, Non-Healing Wounds, Rash and Ulcer. HEENT Present- Wears glasses/contact lenses. Not Present- Earache, Hearing Loss, Hoarseness, Nose Bleed, Oral Ulcers, Ringing in the Ears, Seasonal Allergies, Sinus Pain, Sore Throat, Visual Disturbances and Yellow Eyes. Respiratory Not Present- Bloody sputum, Chronic Cough, Difficulty Breathing, Snoring and Wheezing. Breast Not Present- Breast Mass, Breast Pain, Nipple Discharge and Skin Changes. Cardiovascular Not Present- Chest Pain, Difficulty Breathing Lying Down, Leg Cramps, Palpitations, Rapid Heart Rate, Shortness of Breath and Swelling of Extremities. Gastrointestinal Not Present- Abdominal Pain, Bloating, Bloody Stool, Change in Bowel Habits, Chronic diarrhea, Constipation, Difficulty Swallowing, Excessive gas, Gets full quickly at meals, Hemorrhoids, Indigestion, Nausea, Rectal Pain and Vomiting. Male Genitourinary Not Present- Blood in Urine, Change in Urinary Stream, Frequency, Impotence, Nocturia, Painful Urination, Urgency and Urine Leakage.  Vitals Keith Phelps; 12/15/2016 4:19 PM) 12/15/2016 4:19 PM Weight: 203 lb Height: 70.5in Body Surface Area: 2.11 m Body Mass Index: 28.72 kg/m  Temp.: 98.65F  Pulse: 70 (Regular)  P.OX: 98% (Room air) BP: 130/72 (Sitting, Left Arm, Standard)       Physical Exam  Keith Hollingshead MD; 12/15/2016 5:41 PM) The physical exam findings are as follows: Note:GENERAL APPEARANCE: Overweight male (mostly in abdomen) in NAD. Pleasant and cooperative.  EARS, NOSE, MOUTH THROAT: [place]/AT external ears: no lesions or deformities external nose:  no lesions or deformities hearing: grossly normal lips: moist, no deformities EYES external: conjunctiva, lids, sclerae normal pupils: equal, round  NECK: Supple, no obvious mass or thyroid mass/enlargement, no trachea deviation  CV ascultation: RRR, no murmur extremity edema: no extremity varicosities: no carotid bruit: no  RESP/CHEST auscultation: breath sounds equal and clear respiratory effort: normal midsternal scar   GASTROINTESTINAL abdomen: Soft, non-tender, non-distended, no masses liver and spleen: not enlarged. hernia: Small umbilical hernia and not completely reducible. scar: Epigastric scar extended from midsternal scar  MUSCULOSKELETAL station and gait: normal digits/nails: no clubbing or cyanosis deformities: none instability: none  SKIN jaundice: none  NEUROLOGIC sensation: intact to touch  PSYCHIATRIC alertness and orientation: normal mood/affect/behavior: normal judgement and insight: normal    Assessment & Plan Keith Hollingshead MD; 08/19/209 1:55 PM) UMBILICAL HERNIA, INCARCERATED (K42.0) Impression: He has a small, chronically incarcerated umbilical hernia most likely containing fatty tissue. It's not symptomatic. However, he is interested in repair.  Plan:. We discussed open umbilical hernia repair with mesh. I have discussed the procedure, risks, and aftercare. Risks include but are not limited to bleeding, infection, wound healing problems, anesthesia, recurrence, injury to intra-abdominal organs, cosmetic deformity. He seems to understand and agrees with the plan.  Jackolyn Confer, M.D.    Low risk stress test - acceptable risk for surgery. Ok to proceed.  Dr.  Debara Phelps

## 2016-12-29 ENCOUNTER — Other Ambulatory Visit (HOSPITAL_COMMUNITY): Payer: Self-pay | Admitting: *Deleted

## 2016-12-29 DIAGNOSIS — M25511 Pain in right shoulder: Secondary | ICD-10-CM | POA: Diagnosis not present

## 2016-12-29 NOTE — Patient Instructions (Addendum)
Keith Phelps  12/29/2016   Your procedure is scheduled on: 01-04-17  Report to [facility] Main  Entrance Take [place]  elevators to 3rd floor to  [place] at 630  AM.  Call this number if you have problems the morning of surgery [phone]    Remember: ONLY 1 PERSON MAY GO WITH YOU TO SHORT STAY TO GET  READY MORNING OF YOUR SURGERY.  Do not eat food or drink liquids :After Midnight.  BRING CPAP MASK AND TUBING   Take these medicines the morning of surgery with A SIP OF WATER: ALFUZOSIN (UROXATRAL)              You may not have any metal on your body including hair pins and              piercings  Do not wear jewelry, make-up, lotions, powders or perfumes, deodorant             Do not wear nail polish.  Do not shave  48 hours prior to surgery.              Men may shave face and neck.   Do not bring valuables to the hospital. [place].  Contacts, dentures or bridgework may not be worn into surgery.  Leave suitcase in the car. After surgery it may be brought to your room.     Patients discharged the day of surgery will not be allowed to drive home.  Name and phone number of your driver: fiance Keith Phelps patient will bring # number day of surgery  Special Instructions: N/A              Please read over the following fact sheets you were given: _____________________________________________________________________             [facility] - Preparing for Surgery Before surgery, you can play an important role.  Because skin is not sterile, your skin needs to be as free of germs as possible.  You can reduce the number of germs on your skin by washing with CHG (chlorahexidine gluconate) soap before surgery.  CHG is an antiseptic cleaner which kills germs and bonds with the skin to continue killing germs even after washing. Please DO NOT use if you have an allergy to CHG or antibacterial soaps.  If  your skin becomes reddened/irritated stop using the CHG and inform your nurse when you arrive at Short Stay. Do not shave (including legs and underarms) for at least 48 hours prior to the first CHG shower.  You may shave your face/neck. Please follow these instructions carefully:  1.  Shower with CHG Soap the night before surgery and the  morning of Surgery.  2.  If you choose to wash your hair, wash your hair first as usual with your  normal  shampoo.  3.  After you shampoo, rinse your hair and body thoroughly to remove the  shampoo.                           4.  Use CHG as you would any other liquid soap.  You can apply chg directly  to the skin and wash  Gently with a scrungie or clean washcloth.  5.  Apply the CHG Soap to your body ONLY FROM THE NECK DOWN.   Do not use on face/ open                           Wound or open sores. Avoid contact with eyes, ears mouth and genitals (private parts).                       Wash face,  Genitals (private parts) with your normal soap.             6.  Wash thoroughly, paying special attention to the area where your surgery  will be performed.  7.  Thoroughly rinse your body with warm water from the neck down.  8.  DO NOT shower/wash with your normal soap after using and rinsing off  the CHG Soap.                9.  Pat yourself dry with a clean towel.            10.  Wear clean pajamas.            11.  Place clean sheets on your bed the night of your first shower and do not  sleep with pets. Day of Surgery : Do not apply any lotions/deodorants the morning of surgery.  Please wear clean clothes to the hospital/surgery center.  FAILURE TO FOLLOW THESE INSTRUCTIONS MAY RESULT IN THE CANCELLATION OF YOUR SURGERY PATIENT SIGNATURE_________________________________  NURSE SIGNATURE__________________________________  ________________________________________________________________________

## 2016-12-29 NOTE — Progress Notes (Signed)
STRESS TEST AND CARDIAC CLEARANCE NOTE DR HILTY 12-22-16 EPIC LOV DR HILTY CARDIOLOGY 11-25-16 EPIC  EKG 11-25-16 EPIC

## 2016-12-30 ENCOUNTER — Encounter (HOSPITAL_COMMUNITY)
Admission: RE | Admit: 2016-12-30 | Discharge: 2016-12-30 | Disposition: A | Discharge status: Home / Self Care | Payer: PPO | Source: Ambulatory Visit | Attending: General Surgery | Admitting: General Surgery

## 2016-12-30 ENCOUNTER — Encounter (HOSPITAL_COMMUNITY): Payer: Self-pay

## 2016-12-30 DIAGNOSIS — K429 Umbilical hernia without obstruction or gangrene: Secondary | ICD-10-CM | POA: Insufficient documentation

## 2016-12-30 DIAGNOSIS — Z01812 Encounter for preprocedural laboratory examination: Secondary | ICD-10-CM | POA: Diagnosis not present

## 2016-12-30 HISTORY — DX: Umbilical hernia without obstruction or gangrene: K42.9

## 2016-12-30 HISTORY — DX: Dermatitis, unspecified: L30.9

## 2016-12-30 HISTORY — DX: Benign prostatic hyperplasia without lower urinary tract symptoms: N40.0

## 2016-12-30 LAB — BASIC METABOLIC PANEL
Anion gap: 5 (ref 5–15)
BUN: 17 mg/dL (ref 6–20)
CALCIUM: 8.9 mg/dL (ref 8.9–10.3)
CO2: 24 mmol/L (ref 22–32)
Chloride: 107 mmol/L (ref 101–111)
Creatinine, Ser: 1.04 mg/dL (ref 0.61–1.24)
GFR calc non Af Amer: >60 mL/min (ref >60)
Glucose, Bld: 92 mg/dL (ref 65–99)
Potassium: 4.6 mmol/L (ref 3.5–5.1)
Sodium: 136 mmol/L (ref 135–145)

## 2016-12-30 LAB — CBC
HCT: 39.7 % (ref 39.0–52.0)
Hemoglobin: 14.2 g/dL (ref 13.0–17.0)
MCH: 32.3 pg (ref 26.0–34.0)
MCHC: 35.8 g/dL (ref 30.0–36.0)
MCV: 90.4 fL (ref 78.0–100.0)
Platelets: 236 10*3/uL (ref 150–400)
RBC: 4.39 MIL/uL (ref 4.22–5.81)
RDW: 11.9 % (ref 11.5–15.5)
WBC: 4.7 10*3/uL (ref 4.0–10.5)

## 2017-01-04 ENCOUNTER — Ambulatory Visit (HOSPITAL_COMMUNITY)
Admission: RE | Admit: 2017-01-04 | Discharge: 2017-01-04 | Disposition: A | Discharge status: Home / Self Care | Payer: PPO | Source: Ambulatory Visit | Attending: General Surgery | Admitting: General Surgery

## 2017-01-04 ENCOUNTER — Encounter (HOSPITAL_COMMUNITY): Payer: Self-pay | Admitting: Anesthesiology

## 2017-01-04 ENCOUNTER — Ambulatory Visit (HOSPITAL_COMMUNITY): Payer: PPO | Admitting: Anesthesiology

## 2017-01-04 ENCOUNTER — Encounter (HOSPITAL_COMMUNITY)
Admission: RE | Disposition: A | Discharge status: Home / Self Care | Payer: Self-pay | Source: Ambulatory Visit | Attending: General Surgery

## 2017-01-04 DIAGNOSIS — K420 Umbilical hernia with obstruction, without gangrene: Secondary | ICD-10-CM | POA: Insufficient documentation

## 2017-01-04 DIAGNOSIS — Z823 Family history of stroke: Secondary | ICD-10-CM | POA: Diagnosis not present

## 2017-01-04 DIAGNOSIS — N400 Enlarged prostate without lower urinary tract symptoms: Secondary | ICD-10-CM | POA: Insufficient documentation

## 2017-01-04 DIAGNOSIS — Z79899 Other long term (current) drug therapy: Secondary | ICD-10-CM | POA: Insufficient documentation

## 2017-01-04 DIAGNOSIS — Z7982 Long term (current) use of aspirin: Secondary | ICD-10-CM | POA: Diagnosis not present

## 2017-01-04 DIAGNOSIS — Z881 Allergy status to other antibiotic agents status: Secondary | ICD-10-CM | POA: Insufficient documentation

## 2017-01-04 DIAGNOSIS — I10 Essential (primary) hypertension: Secondary | ICD-10-CM | POA: Diagnosis not present

## 2017-01-04 DIAGNOSIS — I2510 Atherosclerotic heart disease of native coronary artery without angina pectoris: Secondary | ICD-10-CM | POA: Diagnosis not present

## 2017-01-04 DIAGNOSIS — G4733 Obstructive sleep apnea (adult) (pediatric): Secondary | ICD-10-CM | POA: Insufficient documentation

## 2017-01-04 DIAGNOSIS — Z951 Presence of aortocoronary bypass graft: Secondary | ICD-10-CM | POA: Insufficient documentation

## 2017-01-04 DIAGNOSIS — Z9989 Dependence on other enabling machines and devices: Secondary | ICD-10-CM | POA: Diagnosis not present

## 2017-01-04 HISTORY — PX: INSERTION OF MESH: SHX5868

## 2017-01-04 HISTORY — PX: UMBILICAL HERNIA REPAIR: SHX196

## 2017-01-04 SURGERY — REPAIR, HERNIA, UMBILICAL, ADULT
Anesthesia: General

## 2017-01-04 MED ORDER — MEPERIDINE HCL 50 MG/ML IJ SOLN: 6.2500 mg | INTRAMUSCULAR | Status: DC | PRN

## 2017-01-04 MED ORDER — CEFAZOLIN SODIUM-DEXTROSE 2-4 GM/100ML-% IV SOLN
2.0000 g | INTRAVENOUS | Status: AC
  Administered 2017-01-04: 2 g via INTRAVENOUS
  Filled 2017-01-04: qty 100

## 2017-01-04 MED ORDER — FENTANYL CITRATE (PF) 100 MCG/2ML IJ SOLN
INTRAMUSCULAR | Status: AC
  Administered 2017-01-04: 50 ug via INTRAVENOUS
  Filled 2017-01-04: qty 2

## 2017-01-04 MED ORDER — SUGAMMADEX SODIUM 200 MG/2ML IV SOLN
INTRAVENOUS | Status: AC
  Filled 2017-01-04: qty 2

## 2017-01-04 MED ORDER — SUGAMMADEX SODIUM 200 MG/2ML IV SOLN
INTRAVENOUS | Status: DC | PRN
  Administered 2017-01-04: 200 mg via INTRAVENOUS

## 2017-01-04 MED ORDER — MIDAZOLAM HCL 2 MG/2ML IJ SOLN
INTRAMUSCULAR | Status: AC
  Filled 2017-01-04: qty 2

## 2017-01-04 MED ORDER — EPHEDRINE 5 MG/ML INJ
INTRAVENOUS | Status: AC
  Filled 2017-01-04: qty 10

## 2017-01-04 MED ORDER — MIDAZOLAM HCL 5 MG/5ML IJ SOLN
INTRAMUSCULAR | Status: DC | PRN
  Administered 2017-01-04: 2 mg via INTRAVENOUS

## 2017-01-04 MED ORDER — DEXAMETHASONE SODIUM PHOSPHATE 10 MG/ML IJ SOLN
INTRAMUSCULAR | Status: AC
  Filled 2017-01-04: qty 1

## 2017-01-04 MED ORDER — CHLORHEXIDINE GLUCONATE CLOTH 2 % EX PADS: 6.0000 | MEDICATED_PAD | Freq: Once | CUTANEOUS | Status: DC

## 2017-01-04 MED ORDER — FENTANYL CITRATE (PF) 100 MCG/2ML IJ SOLN
25.0000 ug | INTRAMUSCULAR | Status: DC | PRN
  Administered 2017-01-04 (×2): 50 ug via INTRAVENOUS

## 2017-01-04 MED ORDER — PROPOFOL 10 MG/ML IV BOLUS
INTRAVENOUS | Status: DC | PRN
  Administered 2017-01-04: 180 mg via INTRAVENOUS

## 2017-01-04 MED ORDER — SUGAMMADEX SODIUM 500 MG/5ML IV SOLN
INTRAVENOUS | Status: AC
  Filled 2017-01-04: qty 5

## 2017-01-04 MED ORDER — FENTANYL CITRATE (PF) 100 MCG/2ML IJ SOLN
INTRAMUSCULAR | Status: DC | PRN
  Administered 2017-01-04: 100 ug via INTRAVENOUS

## 2017-01-04 MED ORDER — KETAMINE HCL 10 MG/ML IJ SOLN
INTRAMUSCULAR | Status: DC | PRN
  Administered 2017-01-04: 20 mg via INTRAVENOUS

## 2017-01-04 MED ORDER — ROCURONIUM BROMIDE 10 MG/ML (PF) SYRINGE
PREFILLED_SYRINGE | INTRAVENOUS | Status: DC | PRN
  Administered 2017-01-04: 50 mg via INTRAVENOUS

## 2017-01-04 MED ORDER — LACTATED RINGERS IV SOLN
INTRAVENOUS | Status: DC
  Administered 2017-01-04 (×3): via INTRAVENOUS

## 2017-01-04 MED ORDER — OXYCODONE HCL 5 MG PO TABS: 5.0000 mg | ORAL_TABLET | ORAL | 0 refills | Status: DC | PRN

## 2017-01-04 MED ORDER — DEXAMETHASONE SODIUM PHOSPHATE 10 MG/ML IJ SOLN
INTRAMUSCULAR | Status: DC | PRN
  Administered 2017-01-04: 10 mg via INTRAVENOUS

## 2017-01-04 MED ORDER — PROMETHAZINE HCL 25 MG/ML IJ SOLN: 6.2500 mg | INTRAMUSCULAR | Status: DC | PRN

## 2017-01-04 MED ORDER — LACTATED RINGERS IV SOLN: INTRAVENOUS | Status: DC

## 2017-01-04 MED ORDER — ONDANSETRON HCL 4 MG/2ML IJ SOLN
INTRAMUSCULAR | Status: DC | PRN
  Administered 2017-01-04: 4 mg via INTRAVENOUS

## 2017-01-04 MED ORDER — HYDROMORPHONE HCL 1 MG/ML IJ SOLN
0.2500 mg | INTRAMUSCULAR | Status: DC | PRN
  Filled 2017-01-04: qty 0.5

## 2017-01-04 MED ORDER — BUPIVACAINE-EPINEPHRINE (PF) 0.5% -1:200000 IJ SOLN
INTRAMUSCULAR | Status: AC
  Filled 2017-01-04: qty 30

## 2017-01-04 MED ORDER — BUPIVACAINE-EPINEPHRINE 0.5% -1:200000 IJ SOLN
INTRAMUSCULAR | Status: DC | PRN
  Administered 2017-01-04: 20 mL

## 2017-01-04 MED ORDER — PROPOFOL 10 MG/ML IV BOLUS
INTRAVENOUS | Status: AC
  Filled 2017-01-04: qty 40

## 2017-01-04 MED ORDER — LIDOCAINE 2% (20 MG/ML) 5 ML SYRINGE
INTRAMUSCULAR | Status: DC | PRN
  Administered 2017-01-04: 100 mg via INTRAVENOUS

## 2017-01-04 MED ORDER — KETAMINE HCL 10 MG/ML IJ SOLN
INTRAMUSCULAR | Status: AC
  Filled 2017-01-04: qty 1

## 2017-01-04 MED ORDER — FENTANYL CITRATE (PF) 100 MCG/2ML IJ SOLN
INTRAMUSCULAR | Status: AC
  Filled 2017-01-04: qty 2

## 2017-01-04 MED ORDER — OXYCODONE HCL 5 MG PO TABS
5.0000 mg | ORAL_TABLET | ORAL | Status: DC | PRN
  Administered 2017-01-04: 5 mg via ORAL
  Filled 2017-01-04: qty 1

## 2017-01-04 MED ORDER — ONDANSETRON HCL 4 MG/2ML IJ SOLN
INTRAMUSCULAR | Status: AC
  Filled 2017-01-04: qty 2

## 2017-01-04 SURGICAL SUPPLY — 35 items
BENZOIN TINCTURE PRP APPL 2/3 (GAUZE/BANDAGES/DRESSINGS) ×2 IMPLANT
BINDER ABDOMINAL 12 ML 46-62 (SOFTGOODS) IMPLANT
BLADE HEX COATED 2.75 (ELECTRODE) ×2 IMPLANT
CHLORAPREP W/TINT 26ML (MISCELLANEOUS) ×2 IMPLANT
CLOSURE STERI-STRIP 1/4X4 (GAUZE/BANDAGES/DRESSINGS) ×2 IMPLANT
DRAIN CHANNEL 19F RND (DRAIN) IMPLANT
DRAPE INCISE IOBAN 66X45 STRL (DRAPES) IMPLANT
DRAPE LAPAROSCOPIC ABDOMINAL (DRAPES) ×2 IMPLANT
DRAPE UTILITY XL STRL (DRAPES) IMPLANT
DRSG PAD ABDOMINAL 8X10 ST (GAUZE/BANDAGES/DRESSINGS) IMPLANT
DRSG TEGADERM 4X4.75 (GAUZE/BANDAGES/DRESSINGS) ×2 IMPLANT
ELECT REM PT RETURN 15FT ADLT (MISCELLANEOUS) ×2 IMPLANT
EVACUATOR SILICONE 100CC (DRAIN) IMPLANT
GAUZE SPONGE 4X4 12PLY STRL (GAUZE/BANDAGES/DRESSINGS) ×2 IMPLANT
GLOVE ECLIPSE 8.0 STRL XLNG CF (GLOVE) ×2 IMPLANT
GLOVE INDICATOR 8.0 STRL GRN (GLOVE) ×2 IMPLANT
GOWN STRL REUS W/TWL XL LVL3 (GOWN DISPOSABLE) ×4 IMPLANT
KIT BASIN OR (CUSTOM PROCEDURE TRAY) ×2 IMPLANT
MESH HERNIA 3X6 (Mesh General) ×2 IMPLANT
NEEDLE HYPO 25X1 1.5 SAFETY (NEEDLE) ×2 IMPLANT
PACK GENERAL/GYN (CUSTOM PROCEDURE TRAY) ×2 IMPLANT
SEALER TISSUE X1 CVD JAW (INSTRUMENTS) IMPLANT
STAPLER VISISTAT 35W (STAPLE) ×2 IMPLANT
STRIP CLOSURE SKIN 1/2X4 (GAUZE/BANDAGES/DRESSINGS) ×2 IMPLANT
SUT ETHILON 3 0 PS 1 (SUTURE) IMPLANT
SUT MNCRL AB 4-0 PS2 18 (SUTURE) ×2 IMPLANT
SUT NOVA NAB DX-16 0-1 5-0 T12 (SUTURE) ×4 IMPLANT
SUT VIC AB 2-0 CT1 27 (SUTURE)
SUT VIC AB 2-0 CT1 TAPERPNT 27 (SUTURE) IMPLANT
SUT VIC AB 3-0 SH 27 (SUTURE) ×1
SUT VIC AB 3-0 SH 27XBRD (SUTURE) ×1 IMPLANT
SYR 20CC LL (SYRINGE) ×2 IMPLANT
TOWEL OR 17X26 10 PK STRL BLUE (TOWEL DISPOSABLE) ×2 IMPLANT
TOWEL OR NON WOVEN STRL DISP B (DISPOSABLE) ×2 IMPLANT
TRAY FOLEY W/METER SILVER 16FR (SET/KITS/TRAYS/PACK) IMPLANT

## 2017-01-04 NOTE — Transfer of Care (Signed)
Immediate Anesthesia Transfer of Care Note  Patient: Keith Phelps  Procedure(s) Performed: Procedure(s): REPAIR CHRONICALLY INCARCERATED UMBILICAL HERNIA (N/A) INSERTION OF MESH (N/A)  Patient Location: PACU  Anesthesia Type:General  Level of Consciousness:  sedated, patient cooperative and responds to stimulation  Airway & Oxygen Therapy:Patient Spontanous Breathing and Patient connected to face mask oxgen  Post-op Assessment:  Report given to PACU RN and Post -op Vital signs reviewed and stable  Post vital signs:  Reviewed and stable  Last Vitals:  Vitals:   01/04/17 0623  BP: 125/79  Pulse: 63  Resp: 18  Temp: 88.8 C    Complications: No apparent anesthesia complications

## 2017-01-04 NOTE — Discharge Instructions (Signed)
General Anesthesia, Adult, Care After These instructions provide you with information about caring for yourself after your procedure. Your health care provider may also give you more specific instructions. Your treatment has been planned according to current medical practices, but problems sometimes occur. Call your health care provider if you have any problems or questions after your procedure. What can I expect after the procedure? After the procedure, it is common to have:  Vomiting.  A sore throat.  Mental slowness.  It is common to feel:  Nauseous.  Cold or shivery.  Sleepy.  Tired.  Sore or achy, even in parts of your body where you did not have surgery.  Follow these instructions at home: For at least 24 hours after the procedure:  Do not: ? Participate in activities where you could fall or become injured. ? Drive. ? Use heavy machinery. ? Drink alcohol. ? Take sleeping pills or medicines that cause drowsiness. ? Make important decisions or sign legal documents. ? Take care of children on your own.  Rest. Eating and drinking  If you vomit, drink water, juice, or soup when you can drink without vomiting.  Drink enough fluid to keep your urine clear or pale yellow.  Make sure you have little or no nausea before eating solid foods.  Follow the diet recommended by your health care provider. General instructions  Have a responsible adult stay with you until you are awake and alert.  Return to your normal activities as told by your health care provider. Ask your health care provider what activities are safe for you.  Take over-the-counter and prescription medicines only as told by your health care provider.  If you smoke, do not smoke without supervision.  Keep all follow-up visits as told by your health care provider. This is important. Contact a health care provider if:  You continue to have nausea or vomiting at home, and medicines are not helpful.  You  cannot drink fluids or start eating again.  You cannot urinate after 8-12 hours.  You develop a skin rash.  You have fever.  You have increasing redness at the site of your procedure. Get help right away if:  You have difficulty breathing.  You have chest pain.  You have unexpected bleeding.  You feel that you are having a life-threatening or urgent problem. This information is not intended to replace advice given to you by your health care provider. Make sure you discuss any questions you have with your health care provider. Document Released: 10/03/2000 Document Revised: 11/30/2015 Document Reviewed: 06/11/2015 Elsevier Interactive Patient Education  2018 [place] _______[facility]   UMBILICAL HERNIA REPAIR: POST OP INSTRUCTIONS  Always review your discharge instruction sheet given to you by the facility where your surgery was performed. IF YOU HAVE DISABILITY OR FAMILY LEAVE FORMS, YOU MUST BRING THEM TO THE OFFICE FOR PROCESSING.   DO NOT GIVE THEM TO YOUR DOCTOR.  1. A  prescription for pain medication may be given to you upon discharge.  Take your pain medication as prescribed, if needed.  If narcotic pain medicine is not needed, then you may take acetaminophen (Tylenol) or ibuprofen (Advil) as needed. 2. Take your usually prescribed medications unless otherwise directed. 3. If you need a refill on your pain medication, please contact your pharmacy.  They will contact our office to request authorization. Prescriptions will not be filled after 5 pm or on week-ends. 4. You should follow a light diet the first 24 hours  after arrival home, such as soup and crackers, etc.  Be sure to include lots of fluids daily.  Resume your normal diet the day after surgery. 5. Most patients will experience some swelling and bruising around the umbilicus (belly button).  Ice packs and reclining will help.  Swelling and bruising can take many days to resolve.  6. It is  common to experience some constipation if taking pain medication after surgery.  Increasing fluid intake and taking a stool softener (such as Colace) will usually help or prevent this problem from occurring.  A mild laxative (Milk of Magnesia or Miralax) should be taken according to package directions if there are no bowel movements after 48 hours. 7. Unless discharge instructions indicate otherwise, you may remove your bandages 72 hours after surgery, and you may shower at that time.  You may have steri-strips (small skin tapes) in place directly over the incision.  These strips should be left on the skin.  If your surgeon used skin glue on the incision, you may shower in 24 hours.  The glue will flake off over the next 2-3 weeks.  Any sutures or staples will be removed at the office during your follow-up visit. 8. ACTIVITIES:  You may resume regular (light) daily activities beginning the next day--such as daily self-care, walking, climbing stairs--gradually increasing activities as tolerated.  You may have sexual intercourse when it is comfortable.  Refrain from any heavy lifting or straining-nothing over 10 pounds for 6 weeks. Do not lie completely flat for the first 3 days. a. You may drive when you are no longer taking prescription pain medication, you can comfortably wear a seatbelt, and you can safely maneuver your car and apply brakes. b. RETURN TO WORK:  Desk work/Light work in 1-2 weeks, full duty in 6 weeks._________________________________________________________ 9. You should see your doctor in the office for a follow-up appointment approximately 2-3 weeks after your surgery.  Make sure that you call for this appointment within a day or two after you arrive home to insure a convenient appointment time. 10. OTHER INSTRUCTIONS:   __________________________________________________________________________________________________________________________________________________________________________________________  WHEN TO CALL YOUR DOCTOR: 1. Fever over 101.0 2. Inability to urinate 3. Nausea and/or vomiting 4. Extreme swelling or bruising 5. Continued bleeding from incision. 6. Increased pain, redness, or drainage from the incision  The clinic staff is available to answer your questions during regular business hours.  Please dont hesitate to call and ask to speak to one of the nurses for clinical concerns.  If you have a medical emergency, go to the nearest emergency room or call 911.  A surgeon from [facility] is always on call at the hospital   [address], [place], [place], [place]  [address] ?  [address], [place], [place]   [address] [phone] ? [phone] ? FAX [phone] Web site: [email or web address]

## 2017-01-04 NOTE — Anesthesia Preprocedure Evaluation (Addendum)
Anesthesia Evaluation  Patient identified by MRN, date of birth, ID band Patient awake    Reviewed: Allergy & Precautions, NPO status , Patient's Chart, lab work & pertinent test results  Airway Mallampati: I  TM Distance: >3 FB Neck ROM: Full    Dental  (+) Poor Dentition, Chipped,    Pulmonary sleep apnea ,    breath sounds clear to auscultation       Cardiovascular hypertension, Pt. on medications + CAD   Rhythm:Regular Rate:Normal     Neuro/Psych negative neurological ROS  negative psych ROS   GI/Hepatic negative GI ROS, Neg liver ROS,   Endo/Other  negative endocrine ROS  Renal/GU negative Renal ROS  negative genitourinary   Musculoskeletal negative musculoskeletal ROS (+)   Abdominal Normal abdominal exam  (+)   Peds negative pediatric ROS (+)  Hematology negative hematology ROS (+)   Anesthesia Other Findings   Reproductive/Obstetrics negative OB ROS                            Lab Results  Component Value Date   WBC 4.7 12/30/2016   HGB 14.2 12/30/2016   HCT 39.7 12/30/2016   MCV 90.4 12/30/2016   PLT 236 12/30/2016   Lab Results  Component Value Date   CREATININE 1.04 12/30/2016   BUN 17 12/30/2016   NA 136 12/30/2016   K 4.6 12/30/2016   CL 107 12/30/2016   CO2 24 12/30/2016   No results found for: INR, PROTIME  11/2016 EKG: normal sinus rhythm.  Anesthesia Physical Anesthesia Plan  ASA: III  Anesthesia Plan: General   Post-op Pain Management:    Induction: Intravenous  PONV Risk Score and Plan: 3 and Ondansetron, Dexamethasone, Propofol and Midazolam  Airway Management Planned: Oral ETT  Additional Equipment:   Intra-op Plan:   Post-operative Plan: Extubation in OR  Informed Consent: I have reviewed the patients History and Physical, chart, labs and discussed the procedure including the risks, benefits and alternatives for the proposed anesthesia  with the patient or authorized representative who has indicated his/her understanding and acceptance.   Dental advisory given  Plan Discussed with: CRNA  Anesthesia Plan Comments:         Anesthesia Quick Evaluation

## 2017-01-04 NOTE — Anesthesia Procedure Notes (Signed)
Procedure Name: Intubation Date/Time: 01/04/2017 9:00 AM Performed by: Jamela Cumbo, Virgel Gess Pre-anesthesia Checklist: Patient identified, Emergency Drugs available, Suction available, Patient being monitored and Timeout performed Patient Re-evaluated:Patient Re-evaluated prior to inductionOxygen Delivery Method: Circle system utilized Preoxygenation: Pre-oxygenation with 100% oxygen Intubation Type: IV induction Ventilation: Mask ventilation without difficulty Laryngoscope Size: Mac and 4 Grade View: Grade II Tube type: Oral Tube size: 7.5 mm Number of attempts: 1 Airway Equipment and Method: Stylet Placement Confirmation: ETT inserted through vocal cords under direct vision,  positive ETCO2,  CO2 detector and breath sounds checked- equal and bilateral Secured at: 22 cm Tube secured with: Tape Dental Injury: Teeth and Oropharynx as per pre-operative assessment

## 2017-01-04 NOTE — Interval H&P Note (Signed)
History and Physical Interval Note:  01/04/2017 8:21 AM  Keith Phelps  has presented today for surgery, with the diagnosis of Chronically incarcerated umbilical hernia  The various methods of treatment have been discussed with the patient and family. After consideration of risks, benefits and other options for treatment, the patient has consented to  Procedure(s): REPAIR CHRONICALLY INCARCERATED UMBILICAL HERNIA (N/A) INSERTION OF MESH (N/A) as a surgical intervention .  The patient's history has been reviewed, patient examined, no change in status, stable for surgery.  I have reviewed the patient's chart and labs.  Questions were answered to the patient's satisfaction.     [name]

## 2017-01-04 NOTE — H&P (View-Only) (Signed)
[place] 12/15/2016 4:18 PM Location: [facility] Patient #: [id number] DOB: 06-14-1946 Single / Language: [place] / Race: White Male   History of Present Illness Keith Hollingshead MD; 12/15/2016 5:36 PM) The patient is a 71 year old male.  Note:He is referred by Dr. Renold Genta for consultation regarding an umbilical hernia. He is other hernia for some time and he thinks it may be a little larger. He states it doesn't cause him any pain. He does have to strain a little bit to urinate. He occasionally has constipation. He has no obstructive symptoms. He is interested in having the hernia repaired.  His past medical history is notable for coronary artery disease and obstructive sleep apnea-uses CPAP. He had coronary artery bypass many years ago. Dr. Debara Pickett is his cardiologist.  Past Surgical History Jerrye Bushy, [place]; 12/15/2016 4:18 PM) Carotid Artery Surgery  Left. Coronary Artery Bypass Graft  Gallbladder Surgery - Open  Oral Surgery   Diagnostic Studies History Jerrye Bushy, [place]; 12/15/2016 4:18 PM) Colonoscopy  1-5 years ago  Allergies Jerrye Bushy, [place]; 12/15/2016 4:20 PM) Doxycycline Hyclate *TETRACYCLINES*  Allergies Reconciled   Medication History Jerrye Bushy, RMA; 12/15/2016 4:21 PM) Alfuzosin HCl ER (10MG  Tablet ER 24HR, Oral) Active. Ramipril (2.5MG  Capsule, Oral) Active. Trilipix (Oral) Specific strength unknown - Active. Fenofibrate (145MG  Tablet, Oral) Active. Atorvastatin Calcium (40MG  Tablet, Oral) Active. Nystatin (100000 UNIT/GM Powder, External) Active. Medications Reconciled  Social History Jerrye Bushy, [place]; 12/15/2016 4:18 PM) Caffeine use  Carbonated beverages, Tea. No alcohol use  No drug use  Tobacco use  Never smoker.  Family History Jerrye Bushy, [place]; 12/15/2016 4:18 PM) Cerebrovascular Accident  Father.  Other Problems Jerrye Bushy, [place]; 12/15/2016 4:18 PM) Cholelithiasis  Enlarged Prostate  Other disease, cancer,  significant illness  Sleep Apnea     Review of Systems Basilia Jumbo Rogers RMA; 12/15/2016 4:18 PM) General Not Present- Appetite Loss, Chills, Fatigue, Fever, Night Sweats, Weight Gain and Weight Loss. Skin Not Present- Change in Wart/Mole, Dryness, Hives, Jaundice, New Lesions, Non-Healing Wounds, Rash and Ulcer. HEENT Present- Wears glasses/contact lenses. Not Present- Earache, Hearing Loss, Hoarseness, Nose Bleed, Oral Ulcers, Ringing in the Ears, Seasonal Allergies, Sinus Pain, Sore Throat, Visual Disturbances and Yellow Eyes. Respiratory Not Present- Bloody sputum, Chronic Cough, Difficulty Breathing, Snoring and Wheezing. Breast Not Present- Breast Mass, Breast Pain, Nipple Discharge and Skin Changes. Cardiovascular Not Present- Chest Pain, Difficulty Breathing Lying Down, Leg Cramps, Palpitations, Rapid Heart Rate, Shortness of Breath and Swelling of Extremities. Gastrointestinal Not Present- Abdominal Pain, Bloating, Bloody Stool, Change in Bowel Habits, Chronic diarrhea, Constipation, Difficulty Swallowing, Excessive gas, Gets full quickly at meals, Hemorrhoids, Indigestion, Nausea, Rectal Pain and Vomiting. Male Genitourinary Not Present- Blood in Urine, Change in Urinary Stream, Frequency, Impotence, Nocturia, Painful Urination, Urgency and Urine Leakage.  Vitals [employer] Rogers RMA; 12/15/2016 4:19 PM) 12/15/2016 4:19 PM Weight: 203 lb Height: 70.5in Body Surface Area: 2.11 m Body Mass Index: 28.72 kg/m  Temp.: 98.54F  Pulse: 70 (Regular)  P.OX: 98% (Room air) BP: 130/72 (Sitting, Left Arm, Standard)       Physical Exam Keith Hollingshead MD; 12/15/2016 5:41 PM) The physical exam findings are as follows: Note:GENERAL APPEARANCE: Overweight male (mostly in abdomen) in NAD. Pleasant and cooperative.  EARS, NOSE, MOUTH THROAT: [place]/AT external ears: no lesions or deformities external nose: no lesions or deformities hearing: grossly normal lips: moist, no  deformities EYES external: conjunctiva, lids, sclerae normal pupils: equal, round  NECK: Supple, no obvious mass or thyroid mass/enlargement,  no trachea deviation  CV ascultation: RRR, no murmur extremity edema: no extremity varicosities: no carotid bruit: no  RESP/CHEST auscultation: breath sounds equal and clear respiratory effort: normal [place] 12/15/2016 4:18 PM Location: [facility] Patient #: [id number] DOB: 13-Sep-1945 Single / Language: [place] / Race: White Male   History of Present Illness Keith Hollingshead MD; 12/15/2016 5:36 PM) The patient is a 71 year old male.  Note:He is referred by Dr. Renold Genta for consultation regarding an umbilical hernia. He is other hernia for some time and he thinks it may be a little larger. He states it doesn't cause him any pain. He does have to strain a little bit to urinate. He occasionally has constipation. He has no obstructive symptoms. He is interested in having the hernia repaired.  His past medical history is notable for coronary artery disease and obstructive sleep apnea-uses CPAP. He had coronary artery bypass many years ago. Dr. Debara Pickett is his cardiologist.  Past Surgical History Jerrye Bushy, [place]; 12/15/2016 4:18 PM) Carotid Artery Surgery  Left. Coronary Artery Bypass Graft  Gallbladder Surgery - Open  Oral Surgery   Diagnostic Studies History Jerrye Bushy, [place]; 12/15/2016 4:18 PM) Colonoscopy  1-5 years ago  Allergies Jerrye Bushy, [place]; 12/15/2016 4:20 PM) Doxycycline Hyclate *TETRACYCLINES*  Allergies Reconciled   Medication History Jerrye Bushy, RMA; 12/15/2016 4:21 PM) Alfuzosin HCl ER (10MG  Tablet ER 24HR, Oral) Active. Ramipril (2.5MG  Capsule, Oral) Active. Trilipix (Oral) Specific strength unknown - Active. Fenofibrate (145MG  Tablet, Oral) Active. Atorvastatin Calcium (40MG  Tablet, Oral) Active. Nystatin (100000 UNIT/GM Powder, External) Active. Medications Reconciled  Social  History Jerrye Bushy, [place]; 12/15/2016 4:18 PM) Caffeine use  Carbonated beverages, Tea. No alcohol use  No drug use  Tobacco use  Never smoker.  Family History Jerrye Bushy, [place]; 12/15/2016 4:18 PM) Cerebrovascular Accident  Father.  Other Problems Jerrye Bushy, [place]; 12/15/2016 4:18 PM) Cholelithiasis  Enlarged Prostate  Other disease, cancer, significant illness  Sleep Apnea     Review of Systems Basilia Jumbo Rogers RMA; 12/15/2016 4:18 PM) General Not Present- Appetite Loss, Chills, Fatigue, Fever, Night Sweats, Weight Gain and Weight Loss. Skin Not Present- Change in Wart/Mole, Dryness, Hives, Jaundice, New Lesions, Non-Healing Wounds, Rash and Ulcer. HEENT Present- Wears glasses/contact lenses. Not Present- Earache, Hearing Loss, Hoarseness, Nose Bleed, Oral Ulcers, Ringing in the Ears, Seasonal Allergies, Sinus Pain, Sore Throat, Visual Disturbances and Yellow Eyes. Respiratory Not Present- Bloody sputum, Chronic Cough, Difficulty Breathing, Snoring and Wheezing. Breast Not Present- Breast Mass, Breast Pain, Nipple Discharge and Skin Changes. Cardiovascular Not Present- Chest Pain, Difficulty Breathing Lying Down, Leg Cramps, Palpitations, Rapid Heart Rate, Shortness of Breath and Swelling of Extremities. Gastrointestinal Not Present- Abdominal Pain, Bloating, Bloody Stool, Change in Bowel Habits, Chronic diarrhea, Constipation, Difficulty Swallowing, Excessive gas, Gets full quickly at meals, Hemorrhoids, Indigestion, Nausea, Rectal Pain and Vomiting. Male Genitourinary Not Present- Blood in Urine, Change in Urinary Stream, Frequency, Impotence, Nocturia, Painful Urination, Urgency and Urine Leakage.  Vitals [employer] Rogers RMA; 12/15/2016 4:19 PM) 12/15/2016 4:19 PM Weight: 203 lb Height: 70.5in Body Surface Area: 2.11 m Body Mass Index: 28.72 kg/m  Temp.: 98.7F  Pulse: 70 (Regular)  P.OX: 98% (Room air) BP: 130/72 (Sitting, Left Arm, Standard)       Physical Exam  Keith Hollingshead MD; 12/15/2016 5:41 PM) The physical exam findings are as follows: Note:GENERAL APPEARANCE: Overweight male (mostly in abdomen) in NAD. Pleasant and cooperative.  EARS, NOSE, MOUTH THROAT: [place]/AT external ears: no lesions or deformities external nose:  no lesions or deformities hearing: grossly normal lips: moist, no deformities EYES external: conjunctiva, lids, sclerae normal pupils: equal, round  NECK: Supple, no obvious mass or thyroid mass/enlargement, no trachea deviation  CV ascultation: RRR, no murmur extremity edema: no extremity varicosities: no carotid bruit: no  RESP/CHEST auscultation: breath sounds equal and clear respiratory effort: normal midsternal scar   GASTROINTESTINAL abdomen: Soft, non-tender, non-distended, no masses liver and spleen: not enlarged. hernia: Small umbilical hernia and not completely reducible. scar: Epigastric scar extended from midsternal scar  MUSCULOSKELETAL station and gait: normal digits/nails: no clubbing or cyanosis deformities: none instability: none  SKIN jaundice: none  NEUROLOGIC sensation: intact to touch  PSYCHIATRIC alertness and orientation: normal mood/affect/behavior: normal judgement and insight: normal    Assessment & Plan Keith Hollingshead MD; 08/11/1153 2:08 PM) UMBILICAL HERNIA, INCARCERATED (K42.0) Impression: He has a small, chronically incarcerated umbilical hernia most likely containing fatty tissue. It's not symptomatic. However, he is interested in repair.  Plan:. We discussed open umbilical hernia repair with mesh. I have discussed the procedure, risks, and aftercare. Risks include but are not limited to bleeding, infection, wound healing problems, anesthesia, recurrence, injury to intra-abdominal organs, cosmetic deformity. He seems to understand and agrees with the plan.  Jackolyn Confer, M.D.    Low risk stress test - acceptable risk for surgery. Ok to proceed.  Dr.  Debara Pickett

## 2017-01-04 NOTE — Anesthesia Postprocedure Evaluation (Signed)
Anesthesia Post Note  Patient: Keith Phelps  Procedure(s) Performed: Procedure(s) (LRB): REPAIR CHRONICALLY INCARCERATED UMBILICAL HERNIA (N/A) INSERTION OF MESH (N/A)     Patient location during evaluation: PACU Anesthesia Type: General Level of consciousness: awake and alert Pain management: pain level controlled Vital Signs Assessment: post-procedure vital signs reviewed and stable Respiratory status: spontaneous breathing, nonlabored ventilation, respiratory function stable and patient connected to nasal cannula oxygen Cardiovascular status: blood pressure returned to baseline and stable Postop Assessment: no signs of nausea or vomiting Anesthetic complications: no    Last Vitals:  Vitals:   01/04/17 1100 01/04/17 1111  BP: 132/66 (!) 125/57  Pulse: (!) 52 (!) 51  Resp: 12 14  Temp: 36.4 C 36.6 C    Last Pain:  Vitals:   01/04/17 1118  TempSrc:   PainSc: [name]

## 2017-01-04 NOTE — Op Note (Signed)
OPERATIVE NOTE- UMBILICAL HERNIA REPAIR  Preoperative diagnosis: Chronically incarcerated umbilical hernia  Postoperative diagnosis: Same  Procedure: Chronically incarcerated umbilical hernia repair with mesh.  Surgeon: Jackolyn Confer M.D.  Anesthesia: General plus Marcaine local   Indication:This is a 71 year old male with a chronically incarcerated umbilical hernia who presents for elective repair.  Technique: He was seen in the holding room and then brought to the operating room, placed supine on the operating table, and a general anesthetic was given.  The hair in the periumbilical area was clipped as was necessary.  The periumbilical area was sterilely prepped and draped. A timeout was performed.  Marcaine solution was infiltrated superficially and deep in the periumbilical area. A subumbilical transverse incision was made through the skin and subcutaneous tissue until the fascia was identified. The subcutaneous tissue was mobilized free from the fascia inferiorly and laterally. The umbilical stalk was mobilized and dissected free from the fascia exposing the underlying hernia defect which measured 2.5 cm. The hernia sac and tissue within it were reduced back into the abdominal cavity.  The subcutaneous tissue was freed from the fascia for 3-4 cm around the primary defect. The primary defect was then closed with interrupted #1 Novofil sutures. The sutures were left long.  A piece of polypropylene mesh was brought into the field. The primary repair sutures were threaded up through the mesh and tied down anchoring the mesh directly over the primary repair. The periphery of the mesh was then anchored to the fascia with a running 0 Prolene suture to allow for 3-4 cm of mesh overlap. The excess mesh was trimmed and removed.  Hemostasis was adequate at this time. The umbilicus was reimplanted onto the mesh and fascia with 3-0 Vicryl suture. The subcutaneous tissue was closed over the mesh with a  running 3-0 Vicryl suture. The skin was closed with a 4-0 Monocryl subcuticular stitch. Steri-Strips and sterile dressings were applied.  He tolerated the procedure well without any apparent complications and was taken to the recovery room in satisfactory condition.

## 2017-01-13 ENCOUNTER — Other Ambulatory Visit: Payer: Self-pay | Admitting: Cardiovascular Disease

## 2017-01-14 ENCOUNTER — Other Ambulatory Visit: Payer: Self-pay | Admitting: Internal Medicine

## 2017-01-30 DIAGNOSIS — R3915 Urgency of urination: Secondary | ICD-10-CM | POA: Diagnosis not present

## 2017-01-30 DIAGNOSIS — N401 Enlarged prostate with lower urinary tract symptoms: Secondary | ICD-10-CM | POA: Diagnosis not present

## 2017-01-30 DIAGNOSIS — R972 Elevated prostate specific antigen [PSA]: Secondary | ICD-10-CM | POA: Diagnosis not present

## 2017-02-13 DIAGNOSIS — H01004 Unspecified blepharitis left upper eyelid: Secondary | ICD-10-CM | POA: Diagnosis not present

## 2017-02-13 DIAGNOSIS — H01001 Unspecified blepharitis right upper eyelid: Secondary | ICD-10-CM | POA: Diagnosis not present

## 2017-02-13 DIAGNOSIS — H04123 Dry eye syndrome of bilateral lacrimal glands: Secondary | ICD-10-CM | POA: Diagnosis not present

## 2017-02-13 DIAGNOSIS — H40013 Open angle with borderline findings, low risk, bilateral: Secondary | ICD-10-CM | POA: Diagnosis not present

## 2017-03-14 DIAGNOSIS — H6123 Impacted cerumen, bilateral: Secondary | ICD-10-CM | POA: Diagnosis not present

## 2017-03-14 DIAGNOSIS — H903 Sensorineural hearing loss, bilateral: Secondary | ICD-10-CM | POA: Diagnosis not present

## 2017-03-31 ENCOUNTER — Other Ambulatory Visit: Payer: PPO | Admitting: Internal Medicine

## 2017-03-31 DIAGNOSIS — I10 Essential (primary) hypertension: Secondary | ICD-10-CM

## 2017-03-31 DIAGNOSIS — Z0000 Encounter for general adult medical examination without abnormal findings: Secondary | ICD-10-CM

## 2017-03-31 DIAGNOSIS — E8881 Metabolic syndrome: Secondary | ICD-10-CM

## 2017-03-31 DIAGNOSIS — E785 Hyperlipidemia, unspecified: Secondary | ICD-10-CM

## 2017-03-31 DIAGNOSIS — J309 Allergic rhinitis, unspecified: Secondary | ICD-10-CM

## 2017-03-31 DIAGNOSIS — R7302 Impaired glucose tolerance (oral): Secondary | ICD-10-CM | POA: Diagnosis not present

## 2017-03-31 DIAGNOSIS — N400 Enlarged prostate without lower urinary tract symptoms: Secondary | ICD-10-CM

## 2017-04-01 LAB — COMPLETE METABOLIC PANEL WITH GFR
AG Ratio: 1.8 (calc) (ref 1.0–2.5)
ALKALINE PHOSPHATASE (APISO): 47 U/L (ref 40–115)
ALT: 20 U/L (ref 9–46)
AST: 18 U/L (ref 10–35)
Albumin: 4.4 g/dL (ref 3.6–5.1)
BILIRUBIN TOTAL: 0.5 mg/dL (ref 0.2–1.2)
BUN: 13 mg/dL (ref 7–25)
CHLORIDE: 106 mmol/L (ref 98–110)
CO2: 23 mmol/L (ref 20–32)
CREATININE: 1.08 mg/dL (ref 0.70–1.18)
Calcium: 8.9 mg/dL (ref 8.6–10.3)
GFR, Est African American: 80 mL/min/{1.73_m2} (ref >=60)
GFR, Est Non African American: 69 mL/min/{1.73_m2} (ref >=60)
GLUCOSE: 94 mg/dL (ref 65–99)
Globulin: 2.5 g/dL (calc) (ref 1.9–3.7)
Potassium: 4.7 mmol/L (ref 3.5–5.3)
Sodium: 139 mmol/L (ref 135–146)
Total Protein: 6.9 g/dL (ref 6.1–8.1)

## 2017-04-01 LAB — LIPID PANEL
CHOL/HDL RATIO: 3.2 (calc) (ref <5.0)
CHOLESTEROL: 139 mg/dL (ref <200)
HDL: 43 mg/dL (ref >40)
LDL CHOLESTEROL (CALC): 75 mg/dL
NON-HDL CHOLESTEROL (CALC): 96 mg/dL (ref <130)
TRIGLYCERIDES: 126 mg/dL (ref <150)

## 2017-04-01 LAB — HEMOGLOBIN A1C
HEMOGLOBIN A1C: 5.8 %{Hb} — AB (ref <5.7)
Mean Plasma Glucose: 120 (calc)
eAG (mmol/L): 6.6 (calc)

## 2017-04-01 LAB — CBC WITH DIFFERENTIAL/PLATELET
BASOS ABS: 32 {cells}/uL (ref 0–200)
Basophils Relative: 0.7 %
EOS ABS: 194 {cells}/uL (ref 15–500)
EOS PCT: 4.3 %
HCT: 40.6 % (ref 38.5–50.0)
Hemoglobin: 13.7 g/dL (ref 13.2–17.1)
Lymphs Abs: 1310 cells/uL (ref 850–3900)
MCH: 31.7 pg (ref 27.0–33.0)
MCHC: 33.7 g/dL (ref 32.0–36.0)
MCV: 94 fL (ref 80.0–100.0)
MONOS PCT: 8.7 %
MPV: 9.8 fL (ref 7.5–12.5)
NEUTROS ABS: 2574 {cells}/uL (ref 1500–7800)
Neutrophils Relative %: 57.2 %
Platelets: 307 10*3/uL (ref 140–400)
RBC: 4.32 10*6/uL (ref 4.20–5.80)
RDW: 11.9 % (ref 11.0–15.0)
Total Lymphocyte: 29.1 %
WBC mixed population: 392 cells/uL (ref 200–950)
WBC: 4.5 10*3/uL (ref 3.8–10.8)

## 2017-04-01 LAB — MICROALBUMIN / CREATININE URINE RATIO
Creatinine, Urine: 78 mg/dL (ref 20–320)
MICROALB UR: 0.5 mg/dL
MICROALB/CREAT RATIO: 6 ug/mg{creat} (ref <30)

## 2017-04-01 LAB — PSA: PSA: 8.8 ng/mL — AB (ref <=4.0)

## 2017-04-04 DIAGNOSIS — G4733 Obstructive sleep apnea (adult) (pediatric): Secondary | ICD-10-CM | POA: Diagnosis not present

## 2017-04-06 ENCOUNTER — Ambulatory Visit (INDEPENDENT_AMBULATORY_CARE_PROVIDER_SITE_OTHER): Payer: PPO | Admitting: Internal Medicine

## 2017-04-06 ENCOUNTER — Encounter: Payer: Self-pay | Admitting: Internal Medicine

## 2017-04-06 VITALS — BP 110/58 | HR 76 | Temp 98.2°F | Ht 69.0 in | Wt 201.0 lb

## 2017-04-06 DIAGNOSIS — E784 Other hyperlipidemia: Secondary | ICD-10-CM | POA: Diagnosis not present

## 2017-04-06 DIAGNOSIS — E786 Lipoprotein deficiency: Secondary | ICD-10-CM

## 2017-04-06 DIAGNOSIS — I519 Heart disease, unspecified: Secondary | ICD-10-CM | POA: Diagnosis not present

## 2017-04-06 DIAGNOSIS — Z0000 Encounter for general adult medical examination without abnormal findings: Secondary | ICD-10-CM

## 2017-04-06 DIAGNOSIS — Z951 Presence of aortocoronary bypass graft: Secondary | ICD-10-CM

## 2017-04-06 DIAGNOSIS — R7302 Impaired glucose tolerance (oral): Secondary | ICD-10-CM

## 2017-04-06 DIAGNOSIS — I10 Essential (primary) hypertension: Secondary | ICD-10-CM | POA: Diagnosis not present

## 2017-04-06 DIAGNOSIS — R972 Elevated prostate specific antigen [PSA]: Secondary | ICD-10-CM

## 2017-04-06 DIAGNOSIS — E7849 Other hyperlipidemia: Secondary | ICD-10-CM

## 2017-04-06 DIAGNOSIS — I255 Ischemic cardiomyopathy: Secondary | ICD-10-CM

## 2017-04-06 DIAGNOSIS — G4730 Sleep apnea, unspecified: Secondary | ICD-10-CM | POA: Diagnosis not present

## 2017-04-06 LAB — POCT URINALYSIS DIPSTICK
BILIRUBIN UA: NEGATIVE
Glucose, UA: NEGATIVE
KETONES UA: NEGATIVE
Leukocytes, UA: NEGATIVE
Nitrite, UA: NEGATIVE
PH UA: 6 (ref 5.0–8.0)
PROTEIN UA: NEGATIVE
RBC UA: NEGATIVE
SPEC GRAV UA: 1.025 (ref 1.010–1.025)
Urobilinogen, UA: 0.2 E.U./dL

## 2017-04-06 NOTE — Progress Notes (Signed)
Subjective:    Patient ID: Keith Phelps, male    DOB: 1945-09-22, 71 y.o.   MRN: [id number]  HPI 71 year old white male in for health maintenance exam and evaluation of medical issues. He has a history of coronary artery disease followed by cardiologist, impaired glucose tolerance, BPH followed by urologist, hyperlipidemia and hypertension.  For  Awhile, he was working out but he and his fiance have stopped exercising. Discussion about importance of exercise. He feels better when he does exercise.  History of sleep apnea and uses CPAP device.  He is status post coronary artery bypass graft surgery 4 in 2002. History of ischemic cardiomyopathy.  Family history: History of stroke and MI in his father.  Past medical history: Dr. Zella Richer repaired a chronically incarcerated umbilical hernia June 4431. He has done well postoperatively. Cholecystectomy and appendectomy in 1976. Takes Flomax for BPH. History of allergic rhinitis since moving to [place]. He has a history of an elevated PSA followed by Dr. Jeffie Pollock. His PSA in 2016 was 9.09 with a free PSA of 1.59. He seen at least yearly by urologist. PSA is elevated on recent labs associated with this examiner 8.8 and 1 year ago was 5.4. These results have been faxed to Dr. Jeffie Pollock. Patient has history of glaucoma.  Social history: He and his fiance, [place] reside together. They have considered moving to [place] in the near future. He does not smoke or consume alcohol. He retired in May 2011. 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      Review of Systems  Constitutional: Negative.   Respiratory: Negative.   Cardiovascular: Negative.   Gastrointestinal: Negative.   Neurological: Negative.   Psychiatric/Behavioral: Negative.        Objective:   Physical Exam  Constitutional: He is oriented to person, place, and time. He appears well-developed and well-nourished. No distress.  HENT:    Head: Normocephalic and atraumatic.  Right Ear: External ear normal.  Left Ear: External ear normal.  Mouth/Throat: Oropharynx is clear and moist.  Eyes: Pupils are equal, round, and reactive to light. Conjunctivae and EOM are normal. Right eye exhibits no discharge. Left eye exhibits no discharge. No scleral icterus.  Neck: Neck supple. No JVD present. No tracheal deviation present. No thyromegaly present.  Cardiovascular: Normal rate, regular rhythm, normal heart sounds and intact distal pulses.   No murmur heard. Pulmonary/Chest: Effort normal and breath sounds normal. No respiratory distress. He has no wheezes. He has no rales. He exhibits no tenderness.  Abdominal: Soft. Bowel sounds are normal. He exhibits no distension and no mass. There is no tenderness. There is no rebound and no guarding.  Genitourinary:  Genitourinary Comments: Prostate symmetrically enlarged without nodules  Musculoskeletal: He exhibits no edema.  Lymphadenopathy:    He has no cervical adenopathy.  Neurological: He is alert and oriented to person, place, and time. He has normal reflexes. No cranial nerve deficit. Coordination normal.  Skin: Skin is warm and dry. No rash noted. He is not diaphoretic.  Psychiatric: He has a normal mood and affect. His behavior is normal. Judgment and thought content normal.  Vitals reviewed.         Assessment & Plan:  BPH with elevated PSA-results faxed to Dr. Jeffie Pollock and he will be followed up soon  History of coronary artery disease status post CABG followed by cardiologist. History of ischemic cardiomyopathy  Sleep apnea-has C Pap device  Allergic rhinitis  Hyperlipidemia-lipid panel  liver functions are normal on medication  History of impaired glucose tolerance-hemoglobin A1c stable at 5.8% and previously a year ago was 5.6%  Health maintenance-declines flu vaccine  Plan: Return in one year or as needed. Recommend resuming exercise program. Continue same  medications. Continue to monitor diet and follow-up with urologist.  Subjective:   Patient presents for Medicare Annual/Subsequent preventive examination.  Review Past Medical/Family/Social:See above   Risk Factors  Current exercise habits: Not exercising Dietary issues discussed: Low fat low carbohydrate  Cardiac risk factors:History of coronary artery bypass graft surgery, hyperlipidemia, family history  Depression Screen  (Note: if answer to either of the following is "Yes", a more complete depression screening is indicated)   Over the past two weeks, have you felt down, depressed or hopeless? No  Over the past two weeks, have you felt little interest or pleasure in doing things? No Have you lost interest or pleasure in daily life? No Do you often feel hopeless? No Do you cry easily over simple problems? No   Activities of Daily Living  In your present state of health, do you have any difficulty performing the following activities?:   Driving? No  Managing money? No  Feeding yourself? No  Getting from bed to chair? No  Climbing a flight of stairs? No  Preparing food and eating?: No  Bathing or showering? No  Getting dressed: No  Getting to the toilet? No  Using the toilet:No  Moving around from place to place: No  In the past year have you fallen or had a near fall?:No  Are you sexually active? yes Do you have more than one partner? No   Hearing Difficulties: No  Do you often ask people to speak up or repeat themselves? No  Do you experience ringing or noises in your ears? No  Do you have difficulty understanding soft or whispered voices? No  Do you feel that you have a problem with memory? No Do you often misplace items? No    Home Safety:  Do you have a smoke alarm at your residence? Yes Do you have grab bars in the bathroom?No Do you have throw rugs in your house? Yes   Cognitive Testing  Alert? Yes Normal Appearance?Yes  Oriented to person? Yes Place?  Yes  Time? Yes  Recall of three objects? Yes  Can perform simple calculations? Yes  Displays appropriate judgment?Yes  Can read the correct time from a watch face?Yes   List the Names of Other Physician/Practitioners you currently use:  See referral list for the physicians patient is currently seeing.     Review of Systems: See above   Objective:     General appearance: Appears stated age and mildly obese  Head: Normocephalic, without obvious abnormality, atraumatic  Eyes: conj clear, EOMi PEERLA  Ears: normal TM's and external ear canals both ears  Nose: Nares normal. Septum midline. Mucosa normal. No drainage or sinus tenderness.  Throat: lips, mucosa, and tongue normal; teeth and gums normal  Neck: no adenopathy, no carotid bruit, no JVD, supple, symmetrical, trachea midline and thyroid not enlarged, symmetric, no tenderness/mass/nodules  No CVA tenderness.  Lungs: clear to auscultation bilaterally  Breasts: normal appearance, no masses or tenderness Heart: regular rate and rhythm, S1, S2 normal, no murmur, click, rub or gallop  Abdomen: soft, non-tender; bowel sounds normal; no masses, no organomegaly  Musculoskeletal: ROM normal in all joints, no crepitus, no deformity, Normal muscle strengthen. Back  is symmetric, no curvature. Skin: Skin color,  texture, turgor normal. No rashes or lesions  Lymph nodes: Cervical, supraclavicular, and axillary nodes normal.  Neurologic: CN 2 -12 Normal, Normal symmetric reflexes. Normal coordination and gait  Psych: Alert & Oriented x 3, Mood appear stable.    Assessment:    Annual wellness medicare exam   Plan:    During the course of the visit the patient was educated and counseled about appropriate screening and preventive services including:   Declines flu vaccine     Patient Instructions (the written plan) was given to the patient.  Medicare Attestation  I have personally reviewed:  The patient's medical and social  history  Their use of alcohol, tobacco or illicit drugs  Their current medications and supplements  The patient's functional ability including ADLs,fall risks, home safety risks, cognitive, and hearing and visual impairment  Diet and physical activities  Evidence for depression or mood disorders  The patient's weight, height, BMI, and visual acuity have been recorded in the chart. I have made referrals, counseling, and provided education to the patient based on review of the above and I have provided the patient with a written personalized care plan for preventive services.

## 2017-04-08 NOTE — Patient Instructions (Signed)
It was a pleasure to see you today. Please resume exercise program. Continue diet. Return in one year or as needed. No change in medications.

## 2017-07-12 DIAGNOSIS — R972 Elevated prostate specific antigen [PSA]: Secondary | ICD-10-CM | POA: Diagnosis not present

## 2017-07-18 ENCOUNTER — Telehealth: Payer: Self-pay | Admitting: *Deleted

## 2017-07-18 DIAGNOSIS — H40013 Open angle with borderline findings, low risk, bilateral: Secondary | ICD-10-CM | POA: Diagnosis not present

## 2017-07-18 DIAGNOSIS — H52221 Regular astigmatism, right eye: Secondary | ICD-10-CM | POA: Diagnosis not present

## 2017-07-18 DIAGNOSIS — H01001 Unspecified blepharitis right upper eyelid: Secondary | ICD-10-CM | POA: Diagnosis not present

## 2017-07-18 DIAGNOSIS — H01004 Unspecified blepharitis left upper eyelid: Secondary | ICD-10-CM | POA: Diagnosis not present

## 2017-07-18 DIAGNOSIS — H04123 Dry eye syndrome of bilateral lacrimal glands: Secondary | ICD-10-CM | POA: Diagnosis not present

## 2017-07-18 DIAGNOSIS — H2513 Age-related nuclear cataract, bilateral: Secondary | ICD-10-CM | POA: Diagnosis not present

## 2017-07-18 DIAGNOSIS — H524 Presbyopia: Secondary | ICD-10-CM | POA: Diagnosis not present

## 2017-07-18 DIAGNOSIS — H35372 Puckering of macula, left eye: Secondary | ICD-10-CM | POA: Diagnosis not present

## 2017-07-18 DIAGNOSIS — H5212 Myopia, left eye: Secondary | ICD-10-CM | POA: Diagnosis not present

## 2017-07-18 DIAGNOSIS — H5211 Myopia, right eye: Secondary | ICD-10-CM | POA: Diagnosis not present

## 2017-07-18 NOTE — Telephone Encounter (Signed)
Supply order signed and returned to [facility]

## 2017-07-19 DIAGNOSIS — M7541 Impingement syndrome of right shoulder: Secondary | ICD-10-CM | POA: Diagnosis not present

## 2017-07-19 DIAGNOSIS — R972 Elevated prostate specific antigen [PSA]: Secondary | ICD-10-CM | POA: Diagnosis not present

## 2017-07-19 DIAGNOSIS — B356 Tinea cruris: Secondary | ICD-10-CM | POA: Diagnosis not present

## 2017-07-19 DIAGNOSIS — N401 Enlarged prostate with lower urinary tract symptoms: Secondary | ICD-10-CM | POA: Diagnosis not present

## 2017-07-19 DIAGNOSIS — R3912 Poor urinary stream: Secondary | ICD-10-CM | POA: Diagnosis not present

## 2017-07-20 DIAGNOSIS — G4733 Obstructive sleep apnea (adult) (pediatric): Secondary | ICD-10-CM | POA: Diagnosis not present

## 2017-07-27 DIAGNOSIS — M7541 Impingement syndrome of right shoulder: Secondary | ICD-10-CM | POA: Diagnosis not present

## 2017-07-31 DIAGNOSIS — M7541 Impingement syndrome of right shoulder: Secondary | ICD-10-CM | POA: Diagnosis not present

## 2017-08-02 DIAGNOSIS — M7541 Impingement syndrome of right shoulder: Secondary | ICD-10-CM | POA: Diagnosis not present

## 2017-08-07 DIAGNOSIS — M7541 Impingement syndrome of right shoulder: Secondary | ICD-10-CM | POA: Diagnosis not present

## 2017-08-09 DIAGNOSIS — M7541 Impingement syndrome of right shoulder: Secondary | ICD-10-CM | POA: Diagnosis not present

## 2017-08-14 DIAGNOSIS — M7541 Impingement syndrome of right shoulder: Secondary | ICD-10-CM | POA: Diagnosis not present

## 2017-08-16 DIAGNOSIS — M7541 Impingement syndrome of right shoulder: Secondary | ICD-10-CM | POA: Diagnosis not present

## 2017-08-21 DIAGNOSIS — M7541 Impingement syndrome of right shoulder: Secondary | ICD-10-CM | POA: Diagnosis not present

## 2017-08-23 DIAGNOSIS — M7541 Impingement syndrome of right shoulder: Secondary | ICD-10-CM | POA: Diagnosis not present

## 2017-08-24 DIAGNOSIS — Z87898 Personal history of other specified conditions: Secondary | ICD-10-CM | POA: Diagnosis not present

## 2017-08-29 DIAGNOSIS — R69 Illness, unspecified: Secondary | ICD-10-CM | POA: Diagnosis not present

## 2017-10-04 ENCOUNTER — Other Ambulatory Visit: Payer: Self-pay | Admitting: Internal Medicine

## 2017-10-05 DIAGNOSIS — R69 Illness, unspecified: Secondary | ICD-10-CM | POA: Diagnosis not present

## 2017-10-06 ENCOUNTER — Ambulatory Visit (INDEPENDENT_AMBULATORY_CARE_PROVIDER_SITE_OTHER): Payer: Medicare HMO | Admitting: Internal Medicine

## 2017-10-06 ENCOUNTER — Ambulatory Visit
Admission: RE | Admit: 2017-10-06 | Discharge: 2017-10-06 | Disposition: A | Discharge status: Home / Self Care | Payer: PPO | Source: Ambulatory Visit | Attending: Internal Medicine | Admitting: Internal Medicine

## 2017-10-06 ENCOUNTER — Encounter: Payer: Self-pay | Admitting: Internal Medicine

## 2017-10-06 VITALS — BP 110/70 | HR 79 | Temp 98.4°F | Ht 69.0 in | Wt 200.0 lb

## 2017-10-06 DIAGNOSIS — R05 Cough: Secondary | ICD-10-CM

## 2017-10-06 DIAGNOSIS — R059 Cough, unspecified: Secondary | ICD-10-CM

## 2017-10-06 DIAGNOSIS — R0789 Other chest pain: Secondary | ICD-10-CM

## 2017-10-06 LAB — POCT INFLUENZA A/B
INFLUENZA A, POC: NEGATIVE
INFLUENZA B, POC: NEGATIVE

## 2017-10-06 NOTE — Patient Instructions (Signed)
Go home rest do not do any heavy lifting or exertional activity.  See if symptoms will improve.  May try Tylenol for pain.

## 2017-10-06 NOTE — Progress Notes (Signed)
   Subjective:    Patient ID: Keith Phelps, male    DOB: 08/25/1945, 72 y.o.   MRN: [id number]  HPI On Tuesday he noticed some discomfort in right anterior chest.  It did not radiate into the neck or down his arms.  He was not short of breath.  He noticed a bit of a cough that was really nonproductive.  One night he had a few chills.  No documented fever.  He thought he might have the flu.  Later the pain moved more centrally in his chest toward midweek.  He got concerned about the possibility of pneumonia and decided to come to the office today.  He has no sore throat.  No ear pain.  No myalgias.  No nausea vomiting or diarrhea.  EKG today shows normal sinus rhythm.    Review of Systems see above     Objective:   Physical Exam 97% O2 sat.  Vital signs are stable.  Skin warm and dry.  Neck is supple.  Chest clear to auscultation.  TMs and pharynx are clear.  Rapid flu screen is negative.  Cardiac exam regular rate and rhythm.  Extremities without edema.  No palpable right chest wall tenderness.       Assessment & Plan:  Chest pain-etiology unclear.  Thought this might be musculoskeletal chest wall pain is he has been moving some furniture recently.  He does not have palpable chest wall pain.  He is not diaphoretic and does not appear to be in acute distress and is talkative.  He had a chest x-ray that does not show any pneumonia or congestive heart failure.  He is going to go home and take it easy and see how things go over the weekend.  He will call if symptoms worsen.

## 2017-10-09 ENCOUNTER — Telehealth: Payer: Self-pay | Admitting: Internal Medicine

## 2017-10-09 MED ORDER — FENOFIBRATE 160 MG PO TABS: 160.0000 mg | ORAL_TABLET | Freq: Every day | ORAL | 3 refills | Status: DC

## 2017-10-09 NOTE — Telephone Encounter (Signed)
Routed to MD for review/recommendations

## 2017-10-09 NOTE — Telephone Encounter (Signed)
New Message    Pt c/o medication issue:  1. Name of Medication:   fenofibrate (TRICOR) 145 MG tablet TAKE 1 TABLET (145 MG TOTAL) BY MOUTH DAILY.   2. How are you currently taking this medication (dosage and times per day)? 1 tablet daily   3. Are you having a reaction (difficulty breathing--STAT)? no  4. What is your medication issue? [employer] raised the rate for his medication to $300 for a 3 month supply of  fenofibrate (TRICOR) 145 MG tablet TAKE 1 TABLET (145 MG TOTAL) BY MOUTH DAILY.   Please leave message on  vm , what are his options

## 2017-10-09 NOTE — Telephone Encounter (Signed)
Patient called w/MD recommendation of med/dose change. Advised he call back if this is cost-prohibitive. He states gemfibrozil is "preferred"

## 2017-10-09 NOTE — Telephone Encounter (Signed)
We could try the 160 mg generic dose- perhaps that is cheaper. He should ask for an alternative that is covered by insurance. Gemfibrozil is not an option that I would prefer.   Dr. Lemmie Evens

## 2017-10-09 NOTE — Telephone Encounter (Signed)
New Message:    Pt is calling due to the expense of fenofibrate (TRICOR) 145 MG tablet and pt states he would like advice on whether gemfibrozil is about the same as the medication above. If so pt states he would like for you to cancel the other order and send in a prescription for gemfibrozil

## 2017-10-09 NOTE — Telephone Encounter (Signed)
LM for patient to call back to discuss MD recommendations

## 2017-10-09 NOTE — Telephone Encounter (Signed)
This has been addressed in another telephone note.

## 2017-10-09 NOTE — Telephone Encounter (Signed)
New Message   Patient is returning call in reference to tier exceptation for medication. Please call to discuss.

## 2017-10-10 NOTE — Telephone Encounter (Signed)
Patient states he spoke with [employer] who told him they would approve a tier exception for his fenofibrate and reduce it to a tier 1 for a $0 copay. He states Holland Falling is faxing over info that MD will need to complete/provide to verify that this exception is necessary. Advised patient this has not yet been received but I will be looking for it.

## 2017-10-10 NOTE — Telephone Encounter (Signed)
Paperwork received, needs MD signature. Will have to be addressed next week when MD is in the office.

## 2017-10-12 NOTE — Telephone Encounter (Signed)
Called [employer] to f/up on faxed requests for tier exception for fenofibrate 160mg , which I was able to completed over the phone. Tier exception has been approved from 07/10/17 - 07/10/18. Patient called and made aware.

## 2017-10-18 DIAGNOSIS — G4733 Obstructive sleep apnea (adult) (pediatric): Secondary | ICD-10-CM | POA: Diagnosis not present

## 2017-11-27 ENCOUNTER — Encounter: Payer: Self-pay | Admitting: Internal Medicine

## 2017-11-27 ENCOUNTER — Ambulatory Visit: Payer: Medicare HMO | Admitting: Internal Medicine

## 2017-11-27 VITALS — BP 113/66 | HR 57 | Ht 70.5 in | Wt 200.4 lb

## 2017-11-27 DIAGNOSIS — Z951 Presence of aortocoronary bypass graft: Secondary | ICD-10-CM

## 2017-11-27 DIAGNOSIS — E785 Hyperlipidemia, unspecified: Secondary | ICD-10-CM | POA: Diagnosis not present

## 2017-11-27 DIAGNOSIS — I10 Essential (primary) hypertension: Secondary | ICD-10-CM

## 2017-11-27 LAB — LIPID PANEL
CHOL/HDL RATIO: 3.4 ratio (ref 0.0–5.0)
Cholesterol, Total: 141 mg/dL (ref 100–199)
HDL: 41 mg/dL (ref >39)
LDL CALC: 78 mg/dL (ref 0–99)
Triglycerides: 109 mg/dL (ref 0–149)
VLDL CHOLESTEROL CAL: 22 mg/dL (ref 5–40)

## 2017-11-27 MED ORDER — ASPIRIN EC 81 MG PO TBEC: 81.0000 mg | DELAYED_RELEASE_TABLET | Freq: Every day | ORAL | 3 refills | Status: DC

## 2017-11-27 NOTE — Progress Notes (Signed)
OFFICE NOTE  Chief Complaint:  No new complaints  Primary Care Physician: Elby Showers, MD  HPI:  Keith Phelps is a 72 year old gentleman with history of coronary disease and CABG in 2002, LIMA to the LAD, SVG to RCA and SVG to ramus and an SVG to the OM. His angiogram in 2006 showed patent vein grafts. He has done well without any symptoms. He is able to exercise without any chest pain or worsening shortness of breath. 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 He also has dyslipidemia and hypertension.  He was recently diagnosed with borderline diabetes now diet controlled. He has made major changes in his diet and exercise to combat this. He reports over the last year he said problems with his shoulders and had arthroscopic surgery on the right shoulder. He been undergoing rehabilitation on and off. He has recently been missing some doses of his TriCor. We recently obtained a lipid profile that showed his total cholesterol 154, triglycerides 147, HDL 40 and LDL 85.  I saw Keith Phelps back in the office today. He is currently without complaints. Unfortunately he's gained some weight and stopped doing so his exercise. He denies any chest pain or significant worsening shortness of breath. His last nuclear stress test was in 2011 and his bypass was in 2002. He's had good cholesterol control and recent laboratory work continues to show his cholesterol is at goal. Unfortunately his A1c is borderline elevated at 5.9 with a fasting glucose of 123. The rest of his laboratory work is within normal limits.  Keith Phelps returns today for follow-up. Overall he is doing well, however he has gained some additional weight. Today he is 205 pounds. Most of this is abdominal weight. He reports less exercise. 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 He does some short sprint type walking but has significantly decreased his  exercise. His last cholesterol testing was well controlled in August however A1c continues to creep up. He is not currently on any oral antidiabetic medications. Blood pressures controlled today. He denies chest pain or shortness of breath.  11/25/2016  Keith Phelps returns today for follow-up. Overall is done well the past year. Unfortunate is continued to gain some weight. He continues to play a lot of gaming and spends time on his box. He's not been very active. He has worsening abdominal girth and is actually noted to have a ventral hernia. He's apparently scheduled to see surgery for evaluation and possible repair. Hemoglobin A1c seems to have around 5.9. His cholesterol is still mildly elevated but fairly well-controlled. He denies any chest pain or worsening shortness of breath.  11/27/2017  Keith Phelps is seen today for follow-up.  This is an annual visit.  He seems to be doing well.  He is in the process of moving to a retirement community.  He is less active than he used to be.  He had a ventral hernia which was repaired.  He denies any chest pain or shortness of breath.  He is overdue for a lipid profile.  EKG today shows sinus bradycardia at 57.  He is on atorvastatin and fenofibrate.  PMHx:  Past Medical History:  Diagnosis Date  . BPH (benign prostatic hyperplasia)   . Coronary artery disease   . Dermatitis    saborah  . Hyperlipidemia   . Sleep apnea   . Umbilical hernia  Past Surgical History:  Procedure Laterality Date  . APPENDECTOMY    . CARDIAC CATHETERIZATION  06/13/2005   [facility]  EF 55%  . CHOLECYSTECTOMY    . CORONARY ARTERY BYPASS GRAFT  07/18/2000   x 4  . DOPPLER ECHOCARDIOGRAPHY  02/21/2012   EF > 55%  . DOPPLER ECHOCARDIOGRAPHY  03/16/2010   EF 45-50%  . INSERTION OF MESH N/A 01/04/2017   Procedure: INSERTION OF MESH;  Surgeon: Keith Confer, MD;  Location: [facility];  Service: General;  Laterality: N/A;  . NM MYOVIEW LTD  03/16/2010   EF 64%  Low  risk study  . right shoulder arthroscopy  2015  . UMBILICAL HERNIA REPAIR N/A 01/04/2017   Procedure: REPAIR CHRONICALLY INCARCERATED UMBILICAL HERNIA;  Surgeon: Keith Confer, MD;  Location: [facility];  Service: General;  Laterality: N/A;    FAMHx:  Family History  Problem Relation Age of Onset  . Stroke Father   . Heart disease Father     SOCHx:   reports that he has never smoked. He has never used smokeless tobacco. He reports that he does not drink alcohol or use drugs.  ALLERGIES:  Allergies  Allergen Reactions  . Doxycycline Rash and Other (See Comments)    fever    ROS: Pertinent items noted in HPI and remainder of comprehensive ROS otherwise negative.  HOME MEDS: Current Outpatient Medications  Medication Sig Dispense Refill  . aspirin 162 MG EC tablet Take 162 mg by mouth daily.      Marland Kitchen atorvastatin (LIPITOR) 40 MG tablet TAKE 1 TABLET BY MOUTH DAILY . PATIENT NEEDS TO CONTACT OFFICE FOR ADDITIONAL REFILLS 90 tablet 3  . b complex vitamins tablet Take 1 tablet by mouth daily.    . fenofibrate 160 MG tablet Take 1 tablet (160 mg total) by mouth daily. 90 tablet 3  . hydrocortisone 1 % ointment Apply 1 application topically as needed for itching.    . Multiple Vitamin (MULTIVITAMIN) tablet Take 1 tablet by mouth daily.    . Multiple Vitamins-Minerals (PRESERVISION AREDS 2 PO) Take 1 tablet by mouth daily.    Marland Kitchen nystatin (NYSTATIN) powder Apply 1 g topically daily as needed (itching).    . ramipril (ALTACE) 2.5 MG capsule TAKE 1 CAPSULE (2.5 MG TOTAL) BY MOUTH DAILY. 90 capsule 3  . vitamin C (ASCORBIC ACID) 500 MG tablet Take 500 mg by mouth daily.    . tamsulosin (FLOMAX) 0.4 MG CAPS capsule Take 0.4 mg by mouth daily.  11   No current facility-administered medications for this visit.     LABS/IMAGING: Results for orders placed or performed in visit on 11/27/17 (from the past 48 hour(s))  Lipid panel     Status: None   Collection Time: 11/27/17  9:37 AM  Result  Value Ref Range   Cholesterol, Total 141 100 - 199 mg/dL   Triglycerides 109 0 - 149 mg/dL   HDL 41 >39 mg/dL   VLDL Cholesterol Cal 22 5 - 40 mg/dL   LDL Calculated 78 0 - 99 mg/dL   Chol/HDL Ratio 3.4 0.0 - 5.0 ratio    Comment:                                   T. Chol/HDL Ratio  Men  Women                               1/2 Avg.Risk  3.4    3.3                                   Avg.Risk  5.0    4.4                                2X Avg.Risk  9.6    7.1                                3X Avg.Risk 23.4   11.0    No results found.  VITALS: BP 113/66   Pulse (!) 57   Ht 5' 10.5" (1.791 m)   Wt 200 lb 6.4 oz (90.9 kg)   BMI 28.35 kg/m   EXAM: General appearance: alert, no distress and mildly obese Neck: no carotid bruit and no JVD Lungs: clear to auscultation bilaterally Heart: regular rate and rhythm, S1, S2 normal, no murmur, click, rub or gallop Abdomen: soft, non-tender; bowel sounds normal; no masses,  no organomegaly Extremities: extremities normal, atraumatic, no cyanosis or edema Pulses: 2+ and symmetric Skin: Skin color, texture, turgor normal. No rashes or lesions Neurologic: Grossly normal Psych: pleasant  EKG: Sinus bradycardia 57-personally reviewed  ASSESSMENT: 1. Coronary artery disease status post four-vessel CABG in 2002 2. Low risk Myoview in 09/2014 and 12/2016  3. Dyslipidemia-well controlled 4. Hypertension-at goal 5. Borderline diabetes-diet controlled - A1c 5.9  PLAN: 1.   Keith Phelps continues to do well however could stand to be more active.  He did undergo four-vessel bypass in 2002 and had a low risk stress test last in 12/2016.  Based on those findings, we will continue to monitor for any recurrent symptoms.  He underwent a lipid profile earlier today which did result with total cholesterol 141, triglycerides 109, HDL 41 and LDL 78.  Is a goal LDL is less than 70, and he may reach that with aggressive  exercise.  I encouraged him to work on exercise and weight loss over the next 6 months but if were not able to reach goal then I would recommend adding ezetimibe to his regimen.  Follow-up with me annually or sooner as necessary.  Pixie Casino, MD, [facility], [place] Director of the Advanced Lipid Disorders &  Cardiovascular Risk Reduction Clinic Diplomate of the American Board of Clinical Lipidology Attending Cardiologist  Direct Dial: [phone]  Fax: [phone]  Website:  [email or web address] Rohan Juenger 11/27/2017, 8:24 PM

## 2017-11-27 NOTE — Patient Instructions (Addendum)
Your physician recommends that you return for lab work - FASTING  Per best practice advisory-he currently is on aspirin on 162 mg daily.  It is advised that he decrease the dose to 81 mg daily for lowest bleeding risk and equivalent efficacy.  Your physician wants you to follow-up in: ONE YEAR. You will receive a reminder letter in the mail two months in advance. If you don't receive a letter, please call our office to schedule the follow-up appointment.

## 2018-01-11 ENCOUNTER — Encounter (HOSPITAL_COMMUNITY): Payer: Self-pay | Admitting: Emergency Medicine

## 2018-01-11 ENCOUNTER — Emergency Department (HOSPITAL_COMMUNITY): Payer: Medicare HMO

## 2018-01-11 ENCOUNTER — Emergency Department (HOSPITAL_COMMUNITY)
Admission: EM | Admit: 2018-01-11 | Discharge: 2018-01-11 | Disposition: A | Discharge status: Home / Self Care | Payer: Medicare HMO | Attending: Emergency Medicine | Admitting: Emergency Medicine

## 2018-01-11 DIAGNOSIS — I959 Hypotension, unspecified: Secondary | ICD-10-CM | POA: Diagnosis not present

## 2018-01-11 DIAGNOSIS — Z7982 Long term (current) use of aspirin: Secondary | ICD-10-CM | POA: Insufficient documentation

## 2018-01-11 DIAGNOSIS — S82831A Other fracture of upper and lower end of right fibula, initial encounter for closed fracture: Secondary | ICD-10-CM | POA: Diagnosis not present

## 2018-01-11 DIAGNOSIS — Y999 Unspecified external cause status: Secondary | ICD-10-CM | POA: Insufficient documentation

## 2018-01-11 DIAGNOSIS — Z951 Presence of aortocoronary bypass graft: Secondary | ICD-10-CM | POA: Insufficient documentation

## 2018-01-11 DIAGNOSIS — Y92009 Unspecified place in unspecified non-institutional (private) residence as the place of occurrence of the external cause: Secondary | ICD-10-CM | POA: Insufficient documentation

## 2018-01-11 DIAGNOSIS — W109XXA Fall (on) (from) unspecified stairs and steps, initial encounter: Secondary | ICD-10-CM | POA: Diagnosis not present

## 2018-01-11 DIAGNOSIS — R52 Pain, unspecified: Secondary | ICD-10-CM | POA: Diagnosis not present

## 2018-01-11 DIAGNOSIS — S39012A Strain of muscle, fascia and tendon of lower back, initial encounter: Secondary | ICD-10-CM | POA: Insufficient documentation

## 2018-01-11 DIAGNOSIS — S8991XA Unspecified injury of right lower leg, initial encounter: Secondary | ICD-10-CM | POA: Diagnosis present

## 2018-01-11 DIAGNOSIS — W19XXXA Unspecified fall, initial encounter: Secondary | ICD-10-CM

## 2018-01-11 DIAGNOSIS — Y9301 Activity, walking, marching and hiking: Secondary | ICD-10-CM | POA: Insufficient documentation

## 2018-01-11 DIAGNOSIS — R609 Edema, unspecified: Secondary | ICD-10-CM | POA: Diagnosis not present

## 2018-01-11 DIAGNOSIS — I10 Essential (primary) hypertension: Secondary | ICD-10-CM | POA: Insufficient documentation

## 2018-01-11 DIAGNOSIS — S82454A Nondisplaced comminuted fracture of shaft of right fibula, initial encounter for closed fracture: Secondary | ICD-10-CM | POA: Diagnosis not present

## 2018-01-11 DIAGNOSIS — Z79899 Other long term (current) drug therapy: Secondary | ICD-10-CM | POA: Diagnosis not present

## 2018-01-11 DIAGNOSIS — I2510 Atherosclerotic heart disease of native coronary artery without angina pectoris: Secondary | ICD-10-CM | POA: Insufficient documentation

## 2018-01-11 MED ORDER — HYDROCODONE-ACETAMINOPHEN 5-325 MG PO TABS
1.0000 | ORAL_TABLET | Freq: Once | ORAL | Status: AC
  Administered 2018-01-11: 1 via ORAL
  Filled 2018-01-11: qty 1

## 2018-01-11 MED ORDER — HYDROCODONE-ACETAMINOPHEN 5-325 MG PO TABS: 1.0000 | ORAL_TABLET | Freq: Four times a day (QID) | ORAL | 0 refills | Status: DC | PRN

## 2018-01-11 NOTE — Discharge Instructions (Addendum)
Your evaluated in the emergency department for right ankle injury after a fall.  By x-ray you were found to have a distal fibula fracture.  Orthopedics is recommending a you have a cam walker boot and limited weightbearing as tolerated by pain.  You will need to follow-up with Dr. Alvan Dame of orthopedics within the next few weeks.  We are prescribing you some Vicodin to take as needed for pain, cautioned this may be sedating.  You should continue to ice and elevate your ankle for the next few days.  Please return if any worsening symptoms.

## 2018-01-11 NOTE — ED Triage Notes (Signed)
Per EMS pt at home fell down 2 steps at home, twisted R ankle and heard a pop, good pulses, denies LOC, no neck or back pain, pt A+Ox4

## 2018-01-11 NOTE — ED Notes (Signed)
Pt stable and ambulatory for discharge, states understanding follow up. Pt not driving home due to pain meds

## 2018-01-11 NOTE — ED Notes (Signed)
Called ortho.  

## 2018-01-11 NOTE — ED Provider Notes (Signed)
[facility] EMERGENCY DEPARTMENT Provider Note   CSN: [id number] Arrival date & time: 01/11/18  1808     History   Chief Complaint Chief Complaint  Patient presents with  . Fall    HPI Keith Phelps is a 72 y.o. male.  He presents after a fall going down his deck stairs out to the bird feeder.  He states he misjudged that he was not on the last step and he came down into a hole and inverted his right foot and felt a crack in his ankle.  He is complaining of 6 out of 10 sharp pain that increases to 8 with movement.  He denies any other injury no loss consciousness.  Does not associate with any numbness or tingling.  He is tried nothing for it to call EMS.  He is on aspirin for blood thinner but nothing else stronger.  The history is provided by the patient.  Fall  This is a new problem. The current episode started less than 1 hour ago. The problem occurs constantly. The problem has not changed since onset.Pertinent negatives include no chest pain, no abdominal pain, no headaches and no shortness of breath. The symptoms are aggravated by twisting, standing and walking. The symptoms are relieved by rest. He has tried rest for the symptoms. The treatment provided mild relief.    Past Medical History:  Diagnosis Date  . BPH (benign prostatic hyperplasia)   . Coronary artery disease   . Dermatitis    saborah  . Hyperlipidemia   . Sleep apnea   . Umbilical hernia     Patient Active Problem List   Diagnosis Date Noted  . Glaucoma 06/06/2015  . Low HDL (under 40) 06/06/2015  . Metabolic syndrome 49/67/5916  . S/P CABG x 4 07/05/2013  . Impaired glucose tolerance 12/04/2012  . Benign prostatic hyperplasia 02/23/2012  . Essential hypertension 01/28/2011  . Hyperlipidemia 01/28/2011  . Coronary artery disease 01/28/2011  . Sleep apnea 01/28/2011  . Allergic rhinitis 01/28/2011    Past Surgical History:  Procedure Laterality Date  . APPENDECTOMY    .  CARDIAC CATHETERIZATION  06/13/2005   [facility]  EF 55%  . CHOLECYSTECTOMY    . CORONARY ARTERY BYPASS GRAFT  07/18/2000   x 4  . DOPPLER ECHOCARDIOGRAPHY  02/21/2012   EF > 55%  . DOPPLER ECHOCARDIOGRAPHY  03/16/2010   EF 45-50%  . INSERTION OF MESH N/A 01/04/2017   Procedure: INSERTION OF MESH;  Surgeon: Jackolyn Confer, MD;  Location: [facility];  Service: General;  Laterality: N/A;  . NM MYOVIEW LTD  03/16/2010   EF 64%  Low risk study  . right shoulder arthroscopy  2015  . UMBILICAL HERNIA REPAIR N/A 01/04/2017   Procedure: REPAIR CHRONICALLY INCARCERATED UMBILICAL HERNIA;  Surgeon: Jackolyn Confer, MD;  Location: [facility];  Service: General;  Laterality: N/A;        Home Medications    Prior to Admission medications   Medication Sig Start Date End Date Taking? Authorizing Provider  aspirin EC 81 MG tablet Take 1 tablet (81 mg total) by mouth daily. 11/27/17   Hilty, Nadean Corwin, MD  atorvastatin (LIPITOR) 40 MG tablet TAKE 1 TABLET BY MOUTH DAILY . PATIENT NEEDS TO CONTACT OFFICE FOR ADDITIONAL REFILLS 10/04/17   Pixie Casino, MD  b complex vitamins tablet Take 1 tablet by mouth daily.    [provider]  fenofibrate 160 MG tablet Take 1 tablet (160 mg total)  by mouth daily. 10/09/17   Hilty, Nadean Corwin, MD  hydrocortisone 1 % ointment Apply 1 application topically as needed for itching.    [provider]  Multiple Vitamin (MULTIVITAMIN) tablet Take 1 tablet by mouth daily.    [provider]  Multiple Vitamins-Minerals (PRESERVISION AREDS 2 PO) Take 1 tablet by mouth daily.    [provider]  nystatin (NYSTATIN) powder Apply 1 g topically daily as needed (itching).    [provider]  ramipril (ALTACE) 2.5 MG capsule TAKE 1 CAPSULE (2.5 MG TOTAL) BY MOUTH DAILY. 10/04/17   Hilty, Nadean Corwin, MD  tamsulosin (FLOMAX) 0.4 MG CAPS capsule Take 0.4 mg by mouth daily. 10/09/17   [provider]  vitamin C (ASCORBIC ACID) 500 MG tablet  Take 500 mg by mouth daily.    [provider]    Family History Family History  Problem Relation Age of Onset  . Stroke Father   . Heart disease Father     Social History Social History   Tobacco Use  . Smoking status: Never Smoker  . Smokeless tobacco: Never Used  Substance Use Topics  . Alcohol use: No  . Drug use: No     Allergies   Doxycycline   Review of Systems Review of Systems  Constitutional: Negative for fever.  HENT: Negative for sore throat.   Eyes: Negative for visual disturbance.  Respiratory: Negative for shortness of breath.   Cardiovascular: Negative for chest pain.  Gastrointestinal: Negative for abdominal pain.  Genitourinary: Negative for dysuria.  Musculoskeletal: Positive for joint swelling. Negative for back pain and neck pain.  Skin: Negative for rash and wound.  Neurological: Negative for headaches.     Physical Exam Updated Vital Signs BP (!) 134/113   Ht 5\' 10"  (1.778 m)   Wt 88.9 kg (196 lb)   SpO2 98%   BMI 28.12 kg/m   Physical Exam  Constitutional: He is oriented to person, place, and time. He appears well-developed and well-nourished.  HENT:  Head: Normocephalic and atraumatic.  Eyes: Conjunctivae are normal.  Neck: Neck supple.  Cardiovascular: Normal rate, regular rhythm, normal heart sounds and intact distal pulses.  Pulmonary/Chest: Effort normal. He has no wheezes. He has no rales.  Abdominal: Soft. He exhibits no mass. There is no tenderness. There is no guarding.  Musculoskeletal: He exhibits edema, tenderness and deformity.  Patient has a moderate amount of swelling around the lateral aspect of his right ankle.  He is tender both on the lateral mall and the ATFL and CFL.  He is got no calcaneal or fifth metatarsal pain.  No proximal leg pain.  Cap refill and DP pulse intact.  Is got full range of motion at his knee hip and all 3 other extremities full range of motion without deformity or tenderness.    Neurological: He is alert and oriented to person, place, and time. He has normal strength. No sensory deficit. GCS eye subscore is 4. GCS verbal subscore is 5. GCS motor subscore is 6.  Skin: Skin is warm and dry.  Psychiatric: He has a normal mood and affect.  Nursing note and vitals reviewed.    ED Treatments / Results  Labs (all labs ordered are listed, but only abnormal results are displayed) Labs Reviewed - No data to display  EKG None  Radiology Dg Tibia/fibula Right  Result Date: 01/11/2018 CLINICAL DATA:  Pt fell down 2 steps at home and felt and heard a pop in his ankle.  Pt has swelling medial and lateral in his ankle and some pain above his ankle in the distal tib/fib. EXAM: RIGHT TIBIA AND FIBULA - 2 VIEW COMPARISON:  None. FINDINGS: There is a fracture of the distal fibula, nondisplaced, best seen on the lateral view. Please refer to the ankle radiographs for further details. No other evidence of a fracture. The ankle and knee joints are normally aligned. No bone lesions. There is lateral soft tissue swelling at the ankle joint. IMPRESSION: 1. Nondisplaced fracture of the distal fibula. 2. No other fractures.  No dislocation. Electronically Signed   By: Lajean Manes M.D.   On: 01/11/2018 19:04   Dg Ankle Complete Right  Result Date: 01/11/2018 CLINICAL DATA:  Pt fell down 2 steps at home and felt and heard a pop in his ankle. Pt has swelling medial and lateral in his ankle and some pain above his ankle in the distal tib/fib. EXAM: RIGHT ANKLE - COMPLETE 3+ VIEW COMPARISON:  None. FINDINGS: There is a nondisplaced, non comminuted oblique fracture of the distal fibula extending from the posterolateral distal shaft to the medial metaphysis at the level of the ankle joint. No other acute fracture. There is a chronic appearing osteochondral lesion of the medial talar dome. Separate bone fragment lies within the osteochondral defect. The ankle mortise is normally spaced and aligned.  There is soft tissue swelling which predominates laterally. IMPRESSION: 1. Nondisplaced, non comminuted fracture of the distal fibula. 2. 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 3. Well aligned ankle joint. Electronically Signed   By: Lajean Manes M.D.   On: 01/11/2018 19:06    Procedures Procedures (including critical care time)  Medications Ordered in ED Medications  HYDROcodone-acetaminophen (NORCO/VICODIN) 5-325 MG per tablet 1 tablet (has no administration in time range)     Initial Impression / Assessment and Plan / ED Course  I have reviewed the triage vital signs and the nursing notes.  Pertinent labs & imaging results that were available during my care of the patient were reviewed by me and considered in my medical decision making (see chart for details).  Clinical Course as of Jan 13 852  Thu Jan 11, 2018  1952 Discussed with Dr. Alvan Dame from orthopedics.  He is recommending the patient be placed in a cam walker boot weightbearing as tolerated and follow-up with their office in a couple of weeks.   [MB]  9390 Reviewed plan with patient he is comfortable.  He says he is got a walker and a lot of other orthopedic equipment home from prior family injuries.   [MB]    Clinical Course User Index [MB] Hayden Rasmussen, MD    Final Clinical Impressions(s) / ED Diagnoses   Final diagnoses:  Closed fracture of distal end of right fibula, unspecified fracture morphology, initial encounter  Fall, initial encounter  Lumbar strain, initial encounter    ED Discharge Orders        Ordered    HYDROcodone-acetaminophen (NORCO/VICODIN) 5-325 MG tablet  Every 6 hours PRN     01/11/18 2001       Hayden Rasmussen, MD 01/12/18 [phone]

## 2018-01-11 NOTE — Progress Notes (Signed)
Orthopedic Tech Progress Note Patient Details:  Keith Phelps 12-Jul-1945 [id number]  Ortho Devices Type of Ortho Device: Crutches, CAM walker Ortho Device/Splint Location: rle Ortho Device/Splint Interventions: Ordered, Application, Adjustment   Post Interventions Patient Tolerated: Well Instructions Provided: Care of device, Adjustment of device   Karolee Stamps 01/11/2018, 9:02 PM

## 2018-01-11 NOTE — ED Notes (Signed)
Patient transported to X-ray 

## 2018-01-12 DIAGNOSIS — S8264XD Nondisplaced fracture of lateral malleolus of right fibula, subsequent encounter for closed fracture with routine healing: Secondary | ICD-10-CM | POA: Diagnosis not present

## 2018-01-19 DIAGNOSIS — S82401D Unspecified fracture of shaft of right fibula, subsequent encounter for closed fracture with routine healing: Secondary | ICD-10-CM | POA: Diagnosis not present

## 2018-01-19 DIAGNOSIS — G4733 Obstructive sleep apnea (adult) (pediatric): Secondary | ICD-10-CM | POA: Diagnosis not present

## 2018-02-02 DIAGNOSIS — S82891D Other fracture of right lower leg, subsequent encounter for closed fracture with routine healing: Secondary | ICD-10-CM | POA: Diagnosis not present

## 2018-02-23 DIAGNOSIS — S82891D Other fracture of right lower leg, subsequent encounter for closed fracture with routine healing: Secondary | ICD-10-CM | POA: Diagnosis not present

## 2018-02-27 DIAGNOSIS — H40013 Open angle with borderline findings, low risk, bilateral: Secondary | ICD-10-CM | POA: Diagnosis not present

## 2018-02-27 DIAGNOSIS — H01001 Unspecified blepharitis right upper eyelid: Secondary | ICD-10-CM | POA: Diagnosis not present

## 2018-02-27 DIAGNOSIS — H04123 Dry eye syndrome of bilateral lacrimal glands: Secondary | ICD-10-CM | POA: Diagnosis not present

## 2018-02-27 DIAGNOSIS — H01004 Unspecified blepharitis left upper eyelid: Secondary | ICD-10-CM | POA: Diagnosis not present

## 2018-03-07 DIAGNOSIS — M25671 Stiffness of right ankle, not elsewhere classified: Secondary | ICD-10-CM | POA: Diagnosis not present

## 2018-03-09 DIAGNOSIS — M25671 Stiffness of right ankle, not elsewhere classified: Secondary | ICD-10-CM | POA: Diagnosis not present

## 2018-03-13 DIAGNOSIS — J310 Chronic rhinitis: Secondary | ICD-10-CM | POA: Diagnosis not present

## 2018-03-13 DIAGNOSIS — H6983 Other specified disorders of Eustachian tube, bilateral: Secondary | ICD-10-CM | POA: Diagnosis not present

## 2018-03-13 DIAGNOSIS — H6123 Impacted cerumen, bilateral: Secondary | ICD-10-CM | POA: Diagnosis not present

## 2018-03-13 DIAGNOSIS — H903 Sensorineural hearing loss, bilateral: Secondary | ICD-10-CM | POA: Diagnosis not present

## 2018-03-14 DIAGNOSIS — M25671 Stiffness of right ankle, not elsewhere classified: Secondary | ICD-10-CM | POA: Diagnosis not present

## 2018-03-16 DIAGNOSIS — M25671 Stiffness of right ankle, not elsewhere classified: Secondary | ICD-10-CM | POA: Diagnosis not present

## 2018-03-20 DIAGNOSIS — M25671 Stiffness of right ankle, not elsewhere classified: Secondary | ICD-10-CM | POA: Diagnosis not present

## 2018-03-23 DIAGNOSIS — M25671 Stiffness of right ankle, not elsewhere classified: Secondary | ICD-10-CM | POA: Diagnosis not present

## 2018-03-26 DIAGNOSIS — S82891D Other fracture of right lower leg, subsequent encounter for closed fracture with routine healing: Secondary | ICD-10-CM | POA: Diagnosis not present

## 2018-03-27 DIAGNOSIS — M25671 Stiffness of right ankle, not elsewhere classified: Secondary | ICD-10-CM | POA: Diagnosis not present

## 2018-03-30 DIAGNOSIS — M25671 Stiffness of right ankle, not elsewhere classified: Secondary | ICD-10-CM | POA: Diagnosis not present

## 2018-04-02 DIAGNOSIS — M25671 Stiffness of right ankle, not elsewhere classified: Secondary | ICD-10-CM | POA: Diagnosis not present

## 2018-04-05 DIAGNOSIS — M25671 Stiffness of right ankle, not elsewhere classified: Secondary | ICD-10-CM | POA: Diagnosis not present

## 2018-04-06 ENCOUNTER — Other Ambulatory Visit: Payer: Medicare HMO | Admitting: Internal Medicine

## 2018-04-09 ENCOUNTER — Encounter: Payer: Medicare HMO | Admitting: Internal Medicine

## 2018-04-09 DIAGNOSIS — M25671 Stiffness of right ankle, not elsewhere classified: Secondary | ICD-10-CM | POA: Diagnosis not present

## 2018-04-12 DIAGNOSIS — M25671 Stiffness of right ankle, not elsewhere classified: Secondary | ICD-10-CM | POA: Diagnosis not present

## 2018-04-16 DIAGNOSIS — M25671 Stiffness of right ankle, not elsewhere classified: Secondary | ICD-10-CM | POA: Diagnosis not present

## 2018-04-19 DIAGNOSIS — M25671 Stiffness of right ankle, not elsewhere classified: Secondary | ICD-10-CM | POA: Diagnosis not present

## 2018-04-23 DIAGNOSIS — S82891D Other fracture of right lower leg, subsequent encounter for closed fracture with routine healing: Secondary | ICD-10-CM | POA: Diagnosis not present

## 2018-04-24 DIAGNOSIS — G4733 Obstructive sleep apnea (adult) (pediatric): Secondary | ICD-10-CM | POA: Diagnosis not present

## 2018-04-30 ENCOUNTER — Other Ambulatory Visit: Payer: Self-pay | Admitting: *Deleted

## 2018-04-30 DIAGNOSIS — E785 Hyperlipidemia, unspecified: Secondary | ICD-10-CM

## 2018-04-30 DIAGNOSIS — Z951 Presence of aortocoronary bypass graft: Secondary | ICD-10-CM

## 2018-05-07 ENCOUNTER — Other Ambulatory Visit: Payer: Medicare HMO | Admitting: Internal Medicine

## 2018-05-07 DIAGNOSIS — E7849 Other hyperlipidemia: Secondary | ICD-10-CM | POA: Diagnosis not present

## 2018-05-07 DIAGNOSIS — E786 Lipoprotein deficiency: Secondary | ICD-10-CM

## 2018-05-07 DIAGNOSIS — Z0000 Encounter for general adult medical examination without abnormal findings: Secondary | ICD-10-CM | POA: Diagnosis not present

## 2018-05-07 DIAGNOSIS — R7302 Impaired glucose tolerance (oral): Secondary | ICD-10-CM | POA: Diagnosis not present

## 2018-05-07 DIAGNOSIS — I10 Essential (primary) hypertension: Secondary | ICD-10-CM

## 2018-05-07 DIAGNOSIS — R972 Elevated prostate specific antigen [PSA]: Secondary | ICD-10-CM | POA: Diagnosis not present

## 2018-05-08 ENCOUNTER — Other Ambulatory Visit: Payer: Medicare HMO | Admitting: Internal Medicine

## 2018-05-08 LAB — MICROALBUMIN, URINE: Microalb, Ur: 0.2 mg/dL

## 2018-05-08 LAB — CBC WITH DIFFERENTIAL/PLATELET
BASOS PCT: 0.8 %
Basophils Absolute: 38 cells/uL (ref 0–200)
EOS ABS: 154 {cells}/uL (ref 15–500)
Eosinophils Relative: 3.2 %
HEMATOCRIT: 43.1 % (ref 38.5–50.0)
Hemoglobin: 14.8 g/dL (ref 13.2–17.1)
LYMPHS ABS: 1397 {cells}/uL (ref 850–3900)
MCH: 32 pg (ref 27.0–33.0)
MCHC: 34.3 g/dL (ref 32.0–36.0)
MCV: 93.1 fL (ref 80.0–100.0)
MPV: 9.6 fL (ref 7.5–12.5)
Monocytes Relative: 9.7 %
Neutro Abs: 2746 cells/uL (ref 1500–7800)
Neutrophils Relative %: 57.2 %
PLATELETS: 297 10*3/uL (ref 140–400)
RBC: 4.63 10*6/uL (ref 4.20–5.80)
RDW: 12 % (ref 11.0–15.0)
TOTAL LYMPHOCYTE: 29.1 %
WBC: 4.8 10*3/uL (ref 3.8–10.8)
WBCMIX: 466 {cells}/uL (ref 200–950)

## 2018-05-08 LAB — COMPLETE METABOLIC PANEL WITH GFR
AG Ratio: 1.7 (calc) (ref 1.0–2.5)
ALBUMIN MSPROF: 4.5 g/dL (ref 3.6–5.1)
ALT: 22 U/L (ref 9–46)
AST: 19 U/L (ref 10–35)
Alkaline phosphatase (APISO): 51 U/L (ref 40–115)
BUN: 13 mg/dL (ref 7–25)
CALCIUM: 9.3 mg/dL (ref 8.6–10.3)
CO2: 26 mmol/L (ref 20–32)
CREATININE: 0.98 mg/dL (ref 0.70–1.18)
Chloride: 106 mmol/L (ref 98–110)
GFR, EST NON AFRICAN AMERICAN: 77 mL/min/{1.73_m2} (ref >=60)
GFR, Est African American: 89 mL/min/{1.73_m2} (ref >=60)
GLUCOSE: 93 mg/dL (ref 65–99)
Globulin: 2.7 g/dL (calc) (ref 1.9–3.7)
Potassium: 4.9 mmol/L (ref 3.5–5.3)
SODIUM: 139 mmol/L (ref 135–146)
Total Bilirubin: 0.6 mg/dL (ref 0.2–1.2)
Total Protein: 7.2 g/dL (ref 6.1–8.1)

## 2018-05-08 LAB — LIPID PANEL
CHOLESTEROL: 169 mg/dL (ref <200)
HDL: 44 mg/dL (ref >40)
LDL Cholesterol (Calc): 103 mg/dL (calc) — ABNORMAL HIGH
Non-HDL Cholesterol (Calc): 125 mg/dL (calc) (ref <130)
Total CHOL/HDL Ratio: 3.8 (calc) (ref <5.0)
Triglycerides: 123 mg/dL (ref <150)

## 2018-05-08 LAB — HEMOGLOBIN A1C
Hgb A1c MFr Bld: 6.4 % of total Hgb — ABNORMAL HIGH (ref <5.7)
MEAN PLASMA GLUCOSE: 137 (calc)
eAG (mmol/L): 7.6 (calc)

## 2018-05-08 LAB — PSA: PSA: 8.1 ng/mL — AB (ref <=4.0)

## 2018-05-11 ENCOUNTER — Ambulatory Visit (INDEPENDENT_AMBULATORY_CARE_PROVIDER_SITE_OTHER): Payer: Medicare HMO | Admitting: Internal Medicine

## 2018-05-11 ENCOUNTER — Encounter: Payer: Self-pay | Admitting: Internal Medicine

## 2018-05-11 VITALS — BP 116/72 | HR 58 | Temp 98.2°F | Ht 70.5 in | Wt 202.0 lb

## 2018-05-11 DIAGNOSIS — E7849 Other hyperlipidemia: Secondary | ICD-10-CM

## 2018-05-11 DIAGNOSIS — R972 Elevated prostate specific antigen [PSA]: Secondary | ICD-10-CM

## 2018-05-11 DIAGNOSIS — I10 Essential (primary) hypertension: Secondary | ICD-10-CM | POA: Diagnosis not present

## 2018-05-11 DIAGNOSIS — R7302 Impaired glucose tolerance (oral): Secondary | ICD-10-CM | POA: Diagnosis not present

## 2018-05-11 DIAGNOSIS — E786 Lipoprotein deficiency: Secondary | ICD-10-CM | POA: Diagnosis not present

## 2018-05-11 DIAGNOSIS — I519 Heart disease, unspecified: Secondary | ICD-10-CM

## 2018-05-11 DIAGNOSIS — Z951 Presence of aortocoronary bypass graft: Secondary | ICD-10-CM | POA: Diagnosis not present

## 2018-05-11 DIAGNOSIS — Z111 Encounter for screening for respiratory tuberculosis: Secondary | ICD-10-CM | POA: Diagnosis not present

## 2018-05-11 DIAGNOSIS — I255 Ischemic cardiomyopathy: Secondary | ICD-10-CM

## 2018-05-11 DIAGNOSIS — Z0000 Encounter for general adult medical examination without abnormal findings: Secondary | ICD-10-CM | POA: Diagnosis not present

## 2018-05-11 DIAGNOSIS — G4730 Sleep apnea, unspecified: Secondary | ICD-10-CM | POA: Diagnosis not present

## 2018-05-11 LAB — POCT URINALYSIS DIPSTICK
Appearance: NORMAL
Bilirubin, UA: NEGATIVE
Blood, UA: NEGATIVE
GLUCOSE UA: NEGATIVE
KETONES UA: NEGATIVE
Leukocytes, UA: NEGATIVE
Nitrite, UA: NEGATIVE
Odor: NORMAL
PROTEIN UA: NEGATIVE
SPEC GRAV UA: 1.01 (ref 1.010–1.025)
Urobilinogen, UA: 0.2 E.U./dL
pH, UA: 7 (ref 5.0–8.0)

## 2018-05-11 NOTE — Patient Instructions (Addendum)
History of elevated PSA. Hgb AIC has increased. Watch diet and exercise. Declines flu vaccine. RTC 6 months.

## 2018-05-11 NOTE — Progress Notes (Signed)
Subjective:    Patient ID: Keith Phelps, male    DOB: 10-14-1945, 72 y.o.   MRN: [id number]  HPI 72 year old Male for Medicare Wellness health maintenance exam and evaluation of medical issues.  History of coronary artery disease, impaired glucose tolerance, BPH, hyperlipidemia and hypertension.   Had spiral fx of fibula July 2019 after stepping into a hole. Hgb AIC elevated since not able to exercise. Wore boot for 12 weeks.   Moving to [place] with wife in December. Living in apartment at present. They sold their house in the Summer of 2019 anticipating this move to [facility] where they will have their own unit.  Hx of impingement right shoulder January 2019 treated by Dr. Theda Sers at [facility] and had PT for it.  Elevated PSA followed by Dr. Jeffie Pollock, urologist.  Keith Phelps for BPH.  PSA in 2016 was 9.09 with free PSA of 1.59.  He is seen yearly.  Currently PSA is 8.1 and 1 year ago was 8.8.  Urine dipstick is normal.  History of glaucoma.  Social history: He and his wife, Keith Phelps, met on [employer] online.  They recently married after living together for a while.  He does not smoke or consume alcohol.  He retired in May 2011.  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  Spends a lot of time gaming on the computer.  History of sleep apnea and uses CPAP device.  History of appendectomy and cholecystectomy.  He is status post coronary artery bypass graft surgery x4 in 2002.  History of ischemic cardiomyopathy.  In 2006 he had an angiogram that showed patent vein grafts.  Grafts were done include LIMA to LAD, SVG to RCA, SVG to ramus and SVG to OM.  Had low risk Myoview in March 2016.  Past medical history: Dr. Zella Richer repaired a chronically incarcerated umbilical hernia June 8453.  History of allergic rhinitis since moving to [place].  Cholecystectomy and appendectomy 1976.  Family history: History of stroke and heart disease in his father.  He is followed  by Dr. Debara Pickett for Cardiology care.  His lipid panel is stable with an LDL of 103 and previously was better at 78 in May 2019 before his fibula fracture.  His hemoglobin A1c has increased from 5.8% a year ago to 6.4%.  QuantiFERON TB testing done due to move to [facility] is negative.   Review of Systems  Constitutional: Negative.   Respiratory: Negative.   Cardiovascular: Negative.   Gastrointestinal: Negative.   Genitourinary:       Hesitancy and weak stream  Neurological: Negative.   Psychiatric/Behavioral: Negative.        Objective:   Physical Exam  Constitutional: He is oriented to person, place, and time. He appears well-developed and well-nourished.  HENT:  Head: Normocephalic and atraumatic.  Right Ear: External ear normal.  Left Ear: External ear normal.  Mouth/Throat: Oropharynx is clear and moist.  Eyes: Pupils are equal, round, and reactive to light. EOM are normal. Right eye exhibits no discharge. Left eye exhibits no discharge.  Neck: Neck supple. No JVD present. No thyromegaly present.  Cardiovascular: Normal rate and regular rhythm.  Pulmonary/Chest: Effort normal and breath sounds normal. No respiratory distress. He has no wheezes. He has no rales.  Abdominal: Bowel sounds are normal. He exhibits no distension and no mass. There is no tenderness. There is no rebound and no guarding.  Genitourinary:  Genitourinary Comments: Deferred to urologist.  Lymphadenopathy:  He has no cervical adenopathy.  Neurological: He is alert and oriented to person, place, and time. He displays normal reflexes. No cranial nerve deficit. He exhibits normal muscle tone. Coordination normal.  Skin: Skin is warm and dry.          Assessment & Plan:  Elevated PSA followed by Dr. Jeffie Pollock, urologist thought to be due to BPH.  Takes Flomax.  Hyperlipidemia treated with  fenofibrate, atorvastatin  and Zetia per cardiologist.  Impaired glucose tolerance.  Treated with diet  alone.  He is on low-dose ramipril for both hypertension which is mild and impaired glucose tolerance//Renal protection.  Status post coronary artery bypass graft surgery x4 in 2002 with history of ischemic cardiomyopathy followed by Dr. Debara Pickett  History of sleep apnea and uses CPAP device  Allergic rhinitis  Health maintenance-he has been declined flu vaccines in the past.  He has had pneumococcal 23 and Prevnar 13.  Had tetanus immunization 2011.  Hopefully he can get back to exercising after recovering from fibula fracture.  Needs to watch diet.  Subjective:   Patient presents for Medicare Annual/Subsequent preventive examination.  Review Past Medical/Family/Social: See above   Risk Factors  Current exercise habits: Has not exercised much since broken ankle in July Dietary issues discussed: Low-fat low carbohydrate  Cardiac risk factors: Family history, history of CABG, hyperlipidemia, impaired glucose tolerance  Depression Screen  (Note: if answer to either of the following is "Yes", a more complete depression screening is indicated)   Over the past two weeks, have you felt down, depressed or hopeless? No  Over the past two weeks, have you felt little interest or pleasure in doing things? No Have you lost interest or pleasure in daily life? No Do you often feel hopeless? No Do you cry easily over simple problems? No   Activities of Daily Living  In your present state of health, do you have any difficulty performing the following activities?:   Driving? No  Managing money? No  Feeding yourself? No  Getting from bed to chair? No  Climbing a flight of stairs? No  Preparing food and eating?: No  Bathing or showering? No  Getting dressed: No  Getting to the toilet? No  Using the toilet:No  Moving around from place to place: No  In the past year have you fallen or had a near fall?:  Yes broke my ankle Are you sexually active?  Yes Do you have more than one partner? No    Hearing Difficulties: No  Do you often ask people to speak up or repeat themselves? No  Do you experience ringing or noises in your ears? No  Do you have difficulty understanding soft or whispered voices? No  Do you feel that you have a problem with memory? No Do you often misplace items? No    Home Safety:  Do you have a smoke alarm at your residence? Yes Do you have grab bars in the bathroom?  Yes Do you have throw rugs in your house?  No   Cognitive Testing  Alert? Yes Normal Appearance?Yes  Oriented to person? Yes Place? Yes  Time? Yes  Recall of three objects? Yes  Can perform simple calculations? Yes  Displays appropriate judgment?Yes  Can read the correct time from a watch face?Yes   List the Names of Other Physician/Practitioners you currently use:  See referral list for the physicians patient is currently seeing.  Dr Jeffie Pollock, Urologist  Dr. Debara Pickett, [place] for orthopedic  issues    Review of Systems: See above   Objective:     General appearance: Appears stated age and mildly obese  Head: Normocephalic, without obvious abnormality, atraumatic  Eyes: conj clear, EOMi PEERLA  Ears: normal TM's and external ear canals both ears  Nose: Nares normal. Septum midline. Mucosa normal. No drainage or sinus tenderness.  Throat: lips, mucosa, and tongue normal; teeth and gums normal  Neck: no adenopathy, no carotid bruit, no JVD, supple, symmetrical, trachea midline and thyroid not enlarged, symmetric, no tenderness/mass/nodules  No CVA tenderness.  Lungs: clear to auscultation bilaterally  Breasts: normal appearance, no masses or tenderness Heart: regular rate and rhythm, S1, S2 normal, no murmur, click, rub or gallop  Abdomen: soft, non-tender; bowel sounds normal; no masses, no organomegaly  Musculoskeletal: ROM normal in all joints, no crepitus, no deformity, Normal muscle strengthen. Back  is symmetric, no curvature. Skin: Skin color,  texture, turgor normal. No rashes or lesions  Lymph nodes: Cervical, supraclavicular, and axillary nodes normal.  Neurologic: CN 2 -12 Normal, Normal symmetric reflexes. Normal coordination and gait  Psych: Alert & Oriented x 3, Mood appear stable.    Assessment:    Annual wellness medicare exam   Plan:    During the course of the visit the patient was educated and counseled about appropriate screening and preventive services including:   Declines flu vaccine     Patient Instructions (the written plan) was given to the patient.  Medicare Attestation  I have personally reviewed:  The patient's medical and social history  Their use of alcohol, tobacco or illicit drugs  Their current medications and supplements  The patient's functional ability including ADLs,fall risks, home safety risks, cognitive, and hearing and visual impairment  Diet and physical activities  Evidence for depression or mood disorders  The patient's weight, height, BMI, and visual acuity have been recorded in the chart. I have made referrals, counseling, and provided education to the patient based on review of the above and I have provided the patient with a written personalized care plan for preventive services.

## 2018-05-13 LAB — QUANTIFERON-TB GOLD PLUS
NIL: 0.01 IU/mL
QUANTIFERON-TB GOLD PLUS: NEGATIVE
TB1-NIL: 0 IU/mL
TB2-NIL: 0 IU/mL

## 2018-05-16 ENCOUNTER — Telehealth: Payer: Self-pay | Admitting: Internal Medicine

## 2018-05-16 DIAGNOSIS — E785 Hyperlipidemia, unspecified: Secondary | ICD-10-CM

## 2018-05-16 DIAGNOSIS — Z951 Presence of aortocoronary bypass graft: Secondary | ICD-10-CM

## 2018-05-16 NOTE — Telephone Encounter (Signed)
Yes. Sorry, looks like it has not improved to a satisfactory amount. Add zetia - repeat lipids in 3 months.  DR H

## 2018-05-16 NOTE — Telephone Encounter (Signed)
LDL May 2019 - 78 Notes recorded by Pixie Casino, MD on 11/27/2017 at 8:31 PM EDT Cholesterol profile near goal. Advised aggressive diet and weight loss over the next 6 months and repeat lipid profile then. If he is not at goal, then would recommend adding ezetimibe 10 mg daily. Additionally, as per a best practice advisory, he is on 162 mg daily aspirin. It is advised that he switch to 81 mg daily. Please let them know.  DR. Lemmie Evens   LDL Oct 2019 - 103

## 2018-05-16 NOTE — Telephone Encounter (Signed)
Thanks . LDL is elevated, work on diet.  Dr. Lemmie Evens

## 2018-05-16 NOTE — Telephone Encounter (Signed)
Returned call to patient-patient had labs with PCP 10/28 and wanted to verify he did not need anything additional for Dr. Debara Pickett.  Advised Lipid panel was completed, nothing additional needed.  Advised would make Dr. Debara Pickett and primary RN Eliezer Lofts aware.  Lab results from Dr. Renold Genta in [place] for review.

## 2018-05-16 NOTE — Telephone Encounter (Signed)
LMTCB to discuss MD recommendations

## 2018-05-16 NOTE — Telephone Encounter (Signed)
New Message          Patient called today to ask if we need him to still come in for labs? Patient states he just had his physical on 05/11/2018 and labs drawn on 05/07/2018. He received our letter stating that he needs to do labs last night.Pls call and advise.

## 2018-05-17 MED ORDER — EZETIMIBE 10 MG PO TABS: 10.0000 mg | ORAL_TABLET | Freq: Every day | ORAL | 3 refills | Status: DC

## 2018-05-17 NOTE — Telephone Encounter (Signed)
LMTCB

## 2018-05-17 NOTE — Telephone Encounter (Signed)
° ° °  Please return call to patient °

## 2018-05-17 NOTE — Telephone Encounter (Signed)
Patient called in to discuss labs. He reports he broke his ankle in July and has been unable to exercise for a few months. He is now walking up to 1/2 mile per day.   Advised that since his LDL was not at goal under 52 in May, prior to injury, he should start zetia as suggested by MD. He agreed w/plan and will repeat lipids in about 3 months. Lab order mailed. Rx(s) sent to pharmacy electronically.

## 2018-05-18 NOTE — Telephone Encounter (Signed)
° °  Patient calling, he is unable to start ezetimibe (ZETIA) 10 MG tablet. Patient is requesting assistance to get Tier lowered.

## 2018-05-18 NOTE — Telephone Encounter (Signed)
Left message to call back  

## 2018-05-18 NOTE — Telephone Encounter (Signed)
Patient returned call

## 2018-06-18 DIAGNOSIS — S92534A Nondisplaced fracture of distal phalanx of right lesser toe(s), initial encounter for closed fracture: Secondary | ICD-10-CM | POA: Diagnosis not present

## 2018-07-17 DIAGNOSIS — R972 Elevated prostate specific antigen [PSA]: Secondary | ICD-10-CM | POA: Diagnosis not present

## 2018-07-20 ENCOUNTER — Telehealth: Payer: Self-pay | Admitting: Internal Medicine

## 2018-07-20 NOTE — Telephone Encounter (Signed)
Medical Records request from  [facility] Dr. Gerre Couch & [facility] [address].  [place], [place].  [place] Phone:  [phone] Fax:  [phone]  Faxed 241 pages on 07/18/18 Faxed 104 pages of office notes on 07/20/18.    Release completed.

## 2018-07-27 DIAGNOSIS — G4733 Obstructive sleep apnea (adult) (pediatric): Secondary | ICD-10-CM | POA: Diagnosis not present

## 2018-07-30 DIAGNOSIS — N401 Enlarged prostate with lower urinary tract symptoms: Secondary | ICD-10-CM | POA: Diagnosis not present

## 2018-07-30 DIAGNOSIS — B356 Tinea cruris: Secondary | ICD-10-CM | POA: Diagnosis not present

## 2018-07-30 DIAGNOSIS — R350 Frequency of micturition: Secondary | ICD-10-CM | POA: Diagnosis not present

## 2018-07-30 DIAGNOSIS — R972 Elevated prostate specific antigen [PSA]: Secondary | ICD-10-CM | POA: Diagnosis not present

## 2018-07-31 DIAGNOSIS — H01001 Unspecified blepharitis right upper eyelid: Secondary | ICD-10-CM | POA: Diagnosis not present

## 2018-07-31 DIAGNOSIS — H04123 Dry eye syndrome of bilateral lacrimal glands: Secondary | ICD-10-CM | POA: Diagnosis not present

## 2018-07-31 DIAGNOSIS — H524 Presbyopia: Secondary | ICD-10-CM | POA: Diagnosis not present

## 2018-07-31 DIAGNOSIS — H5212 Myopia, left eye: Secondary | ICD-10-CM | POA: Diagnosis not present

## 2018-07-31 DIAGNOSIS — H5211 Myopia, right eye: Secondary | ICD-10-CM | POA: Diagnosis not present

## 2018-07-31 DIAGNOSIS — H52221 Regular astigmatism, right eye: Secondary | ICD-10-CM | POA: Diagnosis not present

## 2018-07-31 DIAGNOSIS — H01002 Unspecified blepharitis right lower eyelid: Secondary | ICD-10-CM | POA: Diagnosis not present

## 2018-07-31 DIAGNOSIS — H40013 Open angle with borderline findings, low risk, bilateral: Secondary | ICD-10-CM | POA: Diagnosis not present

## 2018-08-08 DIAGNOSIS — I2510 Atherosclerotic heart disease of native coronary artery without angina pectoris: Secondary | ICD-10-CM | POA: Diagnosis not present

## 2018-08-08 DIAGNOSIS — R7302 Impaired glucose tolerance (oral): Secondary | ICD-10-CM | POA: Diagnosis not present

## 2018-08-08 DIAGNOSIS — Z9889 Other specified postprocedural states: Secondary | ICD-10-CM | POA: Insufficient documentation

## 2018-08-08 DIAGNOSIS — Z7689 Persons encountering health services in other specified circumstances: Secondary | ICD-10-CM | POA: Diagnosis not present

## 2018-08-08 DIAGNOSIS — G4739 Other sleep apnea: Secondary | ICD-10-CM | POA: Diagnosis not present

## 2018-08-21 DIAGNOSIS — L570 Actinic keratosis: Secondary | ICD-10-CM | POA: Diagnosis not present

## 2018-08-21 DIAGNOSIS — D229 Melanocytic nevi, unspecified: Secondary | ICD-10-CM | POA: Diagnosis not present

## 2018-09-25 ENCOUNTER — Other Ambulatory Visit: Payer: Self-pay | Admitting: Internal Medicine

## 2018-10-23 ENCOUNTER — Telehealth: Payer: Self-pay

## 2018-10-23 NOTE — Telephone Encounter (Signed)
Ok to push labwork and follow-up out 3-4 months.  Dr. Lemmie Evens

## 2018-10-23 NOTE — Telephone Encounter (Signed)
Pt states he is due for a f/u appointment and lab work for April. He report he currently lives in an assisted living facility and was informed by administration, if anyone leave the facility, they will be required to quarantine for 14 days. Pt inquiring if appointment is urgent. Will route to MD.

## 2018-10-23 NOTE — Telephone Encounter (Signed)
Received a Fax from [employer] stating pt was approved for fenofibrate at a lower copayment.  Effective- 07/10/18 Expires-07/11/19  Pt notified

## 2018-10-24 NOTE — Telephone Encounter (Signed)
Pt updated and voiced understanding. Message sent to schedulers to schedule appointment.

## 2018-10-27 DIAGNOSIS — G4733 Obstructive sleep apnea (adult) (pediatric): Secondary | ICD-10-CM | POA: Diagnosis not present

## 2018-11-05 DIAGNOSIS — S80211A Abrasion, right knee, initial encounter: Secondary | ICD-10-CM | POA: Diagnosis not present

## 2018-11-13 DIAGNOSIS — S80211D Abrasion, right knee, subsequent encounter: Secondary | ICD-10-CM | POA: Diagnosis not present

## 2018-12-17 ENCOUNTER — Other Ambulatory Visit: Payer: Self-pay | Admitting: Internal Medicine

## 2019-01-02 DIAGNOSIS — R350 Frequency of micturition: Secondary | ICD-10-CM | POA: Diagnosis not present

## 2019-01-02 DIAGNOSIS — R310 Gross hematuria: Secondary | ICD-10-CM | POA: Diagnosis not present

## 2019-01-02 DIAGNOSIS — N401 Enlarged prostate with lower urinary tract symptoms: Secondary | ICD-10-CM | POA: Diagnosis not present

## 2019-01-09 DIAGNOSIS — R310 Gross hematuria: Secondary | ICD-10-CM | POA: Diagnosis not present

## 2019-01-09 DIAGNOSIS — R350 Frequency of micturition: Secondary | ICD-10-CM | POA: Diagnosis not present

## 2019-01-09 DIAGNOSIS — N401 Enlarged prostate with lower urinary tract symptoms: Secondary | ICD-10-CM | POA: Diagnosis not present

## 2019-01-16 ENCOUNTER — Other Ambulatory Visit: Payer: Self-pay | Admitting: Internal Medicine

## 2019-02-19 LAB — LIPID PANEL
Chol/HDL Ratio: 3.3 ratio (ref 0.0–5.0)
Cholesterol, Total: 130 mg/dL (ref 100–199)
HDL: 39 mg/dL — ABNORMAL LOW (ref >39)
LDL Calculated: 64 mg/dL (ref 0–99)
Triglycerides: 133 mg/dL (ref 0–149)
VLDL Cholesterol Cal: 27 mg/dL (ref 5–40)

## 2019-02-20 ENCOUNTER — Other Ambulatory Visit: Payer: Self-pay | Admitting: Internal Medicine

## 2019-02-25 ENCOUNTER — Encounter: Payer: Self-pay | Admitting: Internal Medicine

## 2019-02-25 ENCOUNTER — Other Ambulatory Visit: Payer: Self-pay

## 2019-02-25 ENCOUNTER — Ambulatory Visit: Payer: Medicare HMO | Admitting: Internal Medicine

## 2019-02-25 VITALS — BP 125/68 | HR 57 | Ht 70.5 in | Wt 204.2 lb

## 2019-02-25 DIAGNOSIS — I10 Essential (primary) hypertension: Secondary | ICD-10-CM

## 2019-02-25 DIAGNOSIS — E785 Hyperlipidemia, unspecified: Secondary | ICD-10-CM

## 2019-02-25 DIAGNOSIS — Z951 Presence of aortocoronary bypass graft: Secondary | ICD-10-CM | POA: Diagnosis not present

## 2019-02-25 NOTE — Patient Instructions (Signed)
Medication Instructions:  Your physician recommends that you continue on your current medications as directed. Please refer to the Current Medication list given to you today.  If you need a refill on your cardiac medications before your next appointment, please call your pharmacy.    Follow-Up: At [facility], you and your health needs are our priority.  As part of our continuing mission to provide you with exceptional heart care, we have created designated Provider Care Teams.  These Care Teams include your primary Cardiologist (physician) and Advanced Practice Providers (APPs -  Physician Assistants and Nurse Practitioners) who all work together to provide you with the care you need, when you need it. You will need a follow up appointment in 12 months.  Please call our office 2 months in advance to schedule this appointment.  You may see Dr. Hilty or one of the following Advanced Practice Providers on your designated Care Team: Hao Meng, PA-C . Angela Duke, PA-C  Any Other Special Instructions Will Be Listed Below (If Applicable).    

## 2019-02-25 NOTE — Progress Notes (Signed)
OFFICE NOTE  Chief Complaint:  No new complaints  Primary Care Physician: Billie Ruddy I, NP  HPI:  Keith Phelps is a 73 year old gentleman with history of coronary disease and CABG in 2002, LIMA to the LAD, SVG to RCA and SVG to ramus and an SVG to the OM. His angiogram in 2006 showed patent vein grafts. He has done well without any symptoms. He is able to exercise without any chest pain or worsening shortness of breath. 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 He also has dyslipidemia and hypertension.  He was recently diagnosed with borderline diabetes now diet controlled. He has made major changes in his diet and exercise to combat this. He reports over the last year he said problems with his shoulders and had arthroscopic surgery on the right shoulder. He been undergoing rehabilitation on and off. He has recently been missing some doses of his TriCor. We recently obtained a lipid profile that showed his total cholesterol 154, triglycerides 147, HDL 40 and LDL 85.  I saw Keith Phelps back in the office today. He is currently without complaints. Unfortunately he's gained some weight and stopped doing so his exercise. He denies any chest pain or significant worsening shortness of breath. His last nuclear stress test was in 2011 and his bypass was in 2002. He's had good cholesterol control and recent laboratory work continues to show his cholesterol is at goal. Unfortunately his A1c is borderline elevated at 5.9 with a fasting glucose of 123. The rest of his laboratory work is within normal limits.  Keith Phelps returns today for follow-up. Overall he is doing well, however he has gained some additional weight. Today he is 205 pounds. Most of this is abdominal weight. He reports less exercise. 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 He does some short sprint type walking but has significantly decreased his  exercise. His last cholesterol testing was well controlled in August however A1c continues to creep up. He is not currently on any oral antidiabetic medications. Blood pressures controlled today. He denies chest pain or shortness of breath.  11/25/2016  Keith Phelps returns today for follow-up. Overall is done well the past year. Unfortunate is continued to gain some weight. He continues to play a lot of gaming and spends time on his box. He's not been very active. He has worsening abdominal girth and is actually noted to have a ventral hernia. He's apparently scheduled to see surgery for evaluation and possible repair. Hemoglobin A1c seems to have around 5.9. His cholesterol is still mildly elevated but fairly well-controlled. He denies any chest pain or worsening shortness of breath.  11/27/2017  Keith Phelps is seen today for follow-up.  This is an annual visit.  He seems to be doing well.  He is in the process of moving to a retirement community.  He is less active than he used to be.  He had a ventral hernia which was repaired.  He denies any chest pain or shortness of breath.  He is overdue for a lipid profile.  EKG today shows sinus bradycardia at 57.  He is on atorvastatin and fenofibrate.   02/25/2019  Keith Phelps returns today for follow-up.  He is continued to do really well.  He lives at [facility] and says that he enjoys his lifestyle there.  He used to be able to go to the gym  however now just walks outside.  After adding ezetimibe to his labs please report of marked improvement in his lipid profile. Labs from a week ago showed total cholesterol 130, triglycerides 133, HDL 39 and LDL 64.  PMHx:  Past Medical History:  Diagnosis Date  . BPH (benign prostatic hyperplasia)   . Coronary artery disease   . Dermatitis    [place]  . Hyperlipidemia   . Sleep apnea   . Umbilical hernia     Past Surgical History:  Procedure Laterality Date  . APPENDECTOMY    . CARDIAC CATHETERIZATION   06/13/2005   [facility]  EF 55%  . CHOLECYSTECTOMY    . CORONARY ARTERY BYPASS GRAFT  07/18/2000   x 4  . DOPPLER ECHOCARDIOGRAPHY  02/21/2012   EF > 55%  . DOPPLER ECHOCARDIOGRAPHY  03/16/2010   EF 45-50%  . INSERTION OF MESH N/A 01/04/2017   Procedure: INSERTION OF MESH;  Surgeon: Jackolyn Confer, MD;  Location: [facility];  Service: General;  Laterality: N/A;  . NM MYOVIEW LTD  03/16/2010   EF 64%  Low risk study  . right shoulder arthroscopy  2015  . UMBILICAL HERNIA REPAIR N/A 01/04/2017   Procedure: REPAIR CHRONICALLY INCARCERATED UMBILICAL HERNIA;  Surgeon: Jackolyn Confer, MD;  Location: [facility];  Service: General;  Laterality: N/A;    FAMHx:  Family History  Problem Relation Age of Onset  . Stroke Father   . Heart disease Father     SOCHx:   reports that he has never smoked. He has never used smokeless tobacco. He reports that he does not drink alcohol or use drugs.  ALLERGIES:  Allergies  Allergen Reactions  . Doxycycline Itching, Rash and Other (See Comments)    fever    ROS: Pertinent items noted in HPI and remainder of comprehensive ROS otherwise negative.  HOME MEDS: Current Outpatient Medications  Medication Sig Dispense Refill  . aspirin EC 81 MG tablet Take 1 tablet (81 mg total) by mouth daily. 90 tablet 3  . atorvastatin (LIPITOR) 40 MG tablet Take 1 tablet (40 mg total) by mouth daily. OV NEEDED 90 tablet 0  . b complex vitamins tablet Take 1 tablet by mouth daily.    Marland Kitchen ezetimibe (ZETIA) 10 MG tablet Take 1 tablet (10 mg total) by mouth daily. 90 tablet 3  . fenofibrate 160 MG tablet TAKE 1 TABLET (160 MG TOTAL) BY MOUTH DAILY. OFFICE VISIT NEEDED 40 tablet 0  . hydrocortisone 1 % ointment Apply 1 application topically as needed for itching.    . Multiple Vitamin (MULTIVITAMIN) tablet Take 1 tablet by mouth daily.    . Multiple Vitamins-Minerals (PRESERVISION AREDS 2 PO) Take 1 tablet by mouth daily.    Marland Kitchen nystatin (NYSTATIN) powder Apply 1 g topically  daily as needed (itching).    . ramipril (ALTACE) 2.5 MG capsule Take 1 capsule (2.5 mg total) by mouth daily. OV NEEDED 90 capsule 0  . tamsulosin (FLOMAX) 0.4 MG CAPS capsule Take 0.4 mg by mouth daily.  11  . vitamin C (ASCORBIC ACID) 500 MG tablet Take 500 mg by mouth daily.     No current facility-administered medications for this visit.     LABS/IMAGING: No results found for this or any previous visit (from the past 48 hour(s)). No results found.  VITALS: BP 125/68   Pulse (!) 57   Ht 5' 10.5" (1.791 m)   Wt 204 lb 3.2 oz (92.6 kg)   SpO2 93%  BMI 28.89 kg/m   EXAM: General appearance: alert, no distress and mildly obese Neck: no carotid bruit and no JVD Lungs: clear to auscultation bilaterally Heart: regular rate and rhythm, S1, S2 normal, no murmur, click, rub or gallop Abdomen: soft, non-tender; bowel sounds normal; no masses,  no organomegaly Extremities: extremities normal, atraumatic, no cyanosis or edema Pulses: 2+ and symmetric Skin: Skin color, texture, turgor normal. No rashes or lesions Neurologic: Grossly normal Psych: pleasant  EKG: Sinus bradycardia 57-personally reviewed  ASSESSMENT: 1. Coronary artery disease status post four-vessel CABG in 2002 2. Low risk Myoview in 09/2014 and 12/2016  3. Dyslipidemia-well controlled 4. Hypertension-at goal 5. Borderline diabetes-diet controlled - A1c 5.9  PLAN: 1.   Mr. Hett has had a nice improvement in his lipid profile with addition of ezetimibe.  His LDL is now at goal less than 70.  Blood pressure is well controlled.  Denies any anginal symptoms.  He is very active and I encouraged continued exercise.  Plan follow-up with me annually or sooner as necessary.  Pixie Casino, MD, [facility], [place] Director of the Advanced Lipid Disorders &  Cardiovascular Risk Reduction Clinic Diplomate of the American Board of Clinical Lipidology Attending Cardiologist  Direct Dial:  [phone]  Fax: [phone]  Website:  [email or web address] 02/25/2019, 10:46 AM

## 2019-03-12 ENCOUNTER — Other Ambulatory Visit: Payer: Self-pay | Admitting: Internal Medicine

## 2019-05-17 ENCOUNTER — Other Ambulatory Visit: Payer: Self-pay | Admitting: Internal Medicine

## 2019-07-15 ENCOUNTER — Other Ambulatory Visit: Payer: Self-pay | Admitting: Internal Medicine

## 2019-07-18 ENCOUNTER — Telehealth: Payer: Self-pay | Admitting: Internal Medicine

## 2019-07-18 NOTE — Telephone Encounter (Signed)
Patient is getting the first shot of the covid vaccine next week. Since he has a quadruple bypass, he wants to know if there is anything special to look out for.

## 2019-07-19 NOTE — Telephone Encounter (Signed)
Pt aware ./cy 

## 2019-07-19 NOTE — Telephone Encounter (Signed)
Nope .. nothing special to worry about. Very few patients with anaphylaxis have had allergic reactions, otherwise, well-tolerated. I recommend it.  Dr Lemmie Evens

## 2019-07-24 ENCOUNTER — Telehealth: Payer: Self-pay | Admitting: Internal Medicine

## 2019-07-24 NOTE — Telephone Encounter (Signed)
Patient's insurance [employer]) has approved tier change for fenofibrate from 07/12/2019 - 07/10/2020

## 2019-09-03 ENCOUNTER — Other Ambulatory Visit: Payer: Self-pay | Admitting: Internal Medicine

## 2019-12-02 DIAGNOSIS — E119 Type 2 diabetes mellitus without complications: Secondary | ICD-10-CM | POA: Insufficient documentation

## 2020-02-14 ENCOUNTER — Telehealth: Payer: Self-pay | Admitting: Internal Medicine

## 2020-02-14 DIAGNOSIS — E785 Hyperlipidemia, unspecified: Secondary | ICD-10-CM

## 2020-02-14 NOTE — Telephone Encounter (Signed)
    Pt calling, he will get his blood work at [place], he is requesting to fax Dr. Lysbeth Penner order to fax# [phone]

## 2020-02-14 NOTE — Telephone Encounter (Signed)
Lipid panel ordered and faxed

## 2020-02-27 ENCOUNTER — Encounter: Payer: Self-pay | Admitting: Internal Medicine

## 2020-02-27 ENCOUNTER — Other Ambulatory Visit: Payer: Self-pay

## 2020-02-27 ENCOUNTER — Ambulatory Visit: Payer: Medicare HMO | Admitting: Internal Medicine

## 2020-02-27 VITALS — BP 98/62 | HR 59 | Ht 70.5 in | Wt 201.0 lb

## 2020-02-27 DIAGNOSIS — Z951 Presence of aortocoronary bypass graft: Secondary | ICD-10-CM | POA: Diagnosis not present

## 2020-02-27 DIAGNOSIS — E785 Hyperlipidemia, unspecified: Secondary | ICD-10-CM

## 2020-02-27 DIAGNOSIS — I10 Essential (primary) hypertension: Secondary | ICD-10-CM | POA: Diagnosis not present

## 2020-02-27 NOTE — Patient Instructions (Signed)

## 2020-02-28 ENCOUNTER — Encounter: Payer: Self-pay | Admitting: Internal Medicine

## 2020-02-28 NOTE — Progress Notes (Signed)
OFFICE NOTE  Chief Complaint:  No new complaints  Primary Care Physician: Billie Ruddy I, NP  HPI:  Keith Phelps is a 74 year old gentleman with history of coronary disease and CABG in 2002, LIMA to the LAD, SVG to RCA and SVG to ramus and an SVG to the OM. His angiogram in 2006 showed patent vein grafts. He has done well without any symptoms. He is able to exercise without any chest pain or worsening shortness of breath. 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 He also has dyslipidemia and hypertension.  He was recently diagnosed with borderline diabetes now diet controlled. He has made major changes in his diet and exercise to combat this. He reports over the last year he said problems with his shoulders and had arthroscopic surgery on the right shoulder. He been undergoing rehabilitation on and off. He has recently been missing some doses of his TriCor. We recently obtained a lipid profile that showed his total cholesterol 154, triglycerides 147, HDL 40 and LDL 85.  I saw Keith Phelps back in the office today. He is currently without complaints. Unfortunately he's gained some weight and stopped doing so his exercise. He denies any chest pain or significant worsening shortness of breath. His last nuclear stress test was in 2011 and his bypass was in 2002. He's had good cholesterol control and recent laboratory work continues to show his cholesterol is at goal. Unfortunately his A1c is borderline elevated at 5.9 with a fasting glucose of 123. The rest of his laboratory work is within normal limits.  Keith Phelps returns today for follow-up. Overall he is doing well, however he has gained some additional weight. Today he is 205 pounds. Most of this is abdominal weight. He reports less exercise. 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 He does some short sprint type walking but has significantly decreased his  exercise. His last cholesterol testing was well controlled in August however A1c continues to creep up. He is not currently on any oral antidiabetic medications. Blood pressures controlled today. He denies chest pain or shortness of breath.  11/25/2016  Keith Phelps returns today for follow-up. Overall is done well the past year. Unfortunate is continued to gain some weight. He continues to play a lot of gaming and spends time on his box. He's not been very active. He has worsening abdominal girth and is actually noted to have a ventral hernia. He's apparently scheduled to see surgery for evaluation and possible repair. Hemoglobin A1c seems to have around 5.9. His cholesterol is still mildly elevated but fairly well-controlled. He denies any chest pain or worsening shortness of breath.  11/27/2017  Keith Phelps is seen today for follow-up.  This is an annual visit.  He seems to be doing well.  He is in the process of moving to a retirement community.  He is less active than he used to be.  He had a ventral hernia which was repaired.  He denies any chest pain or shortness of breath.  He is overdue for a lipid profile.  EKG today shows sinus bradycardia at 57.  He is on atorvastatin and fenofibrate.   02/25/2019  Keith Phelps returns today for follow-up.  He is continued to do really well.  He lives at [facility] and says that he enjoys his lifestyle there.  He used to be able to go to the gym  however now just walks outside.  After adding ezetimibe to his labs please report of marked improvement in his lipid profile. Labs from a week ago showed total cholesterol 130, triglycerides 133, HDL 39 and LDL 64.  02/27/2019  Keith Phelps is seen today in follow-up.  Overall he continues to do well.  He has had significant improvement in his lipids after adding the ezetimibe.  Most recent labs show total cholesterol 111, triglycerides 111, HDL 40 and LDL 49.  He denies any chest pain or shortness of breath.  He remains  physically active.  PMHx:  Past Medical History:  Diagnosis Date  . BPH (benign prostatic hyperplasia)   . Coronary artery disease   . Dermatitis    saborah  . Hyperlipidemia   . Sleep apnea   . Umbilical hernia     Past Surgical History:  Procedure Laterality Date  . APPENDECTOMY    . CARDIAC CATHETERIZATION  06/13/2005   [facility]  EF 55%  . CHOLECYSTECTOMY    . CORONARY ARTERY BYPASS GRAFT  07/18/2000   x 4  . DOPPLER ECHOCARDIOGRAPHY  02/21/2012   EF > 55%  . DOPPLER ECHOCARDIOGRAPHY  03/16/2010   EF 45-50%  . INSERTION OF MESH N/A 01/04/2017   Procedure: INSERTION OF MESH;  Surgeon: Jackolyn Confer, MD;  Location: [facility];  Service: General;  Laterality: N/A;  . NM MYOVIEW LTD  03/16/2010   EF 64%  Low risk study  . right shoulder arthroscopy  2015  . UMBILICAL HERNIA REPAIR N/A 01/04/2017   Procedure: REPAIR CHRONICALLY INCARCERATED UMBILICAL HERNIA;  Surgeon: Jackolyn Confer, MD;  Location: [facility];  Service: General;  Laterality: N/A;    FAMHx:  Family History  Problem Relation Age of Onset  . Stroke Father   . Heart disease Father     SOCHx:   reports that he has never smoked. He has never used smokeless tobacco. He reports that he does not drink alcohol and does not use drugs.  ALLERGIES:  Allergies  Allergen Reactions  . Doxycycline Itching, Rash and Other (See Comments)    fever    ROS: Pertinent items noted in HPI and remainder of comprehensive ROS otherwise negative.  HOME MEDS: Current Outpatient Medications  Medication Sig Dispense Refill  . aspirin EC 81 MG tablet Take 1 tablet (81 mg total) by mouth daily. 90 tablet 3  . atorvastatin (LIPITOR) 40 MG tablet TAKE 1 TABLET (40 MG TOTAL) BY MOUTH DAILY. OV NEEDED 90 tablet 3  . b complex vitamins tablet Take 1 tablet by mouth daily.    Marland Kitchen ezetimibe (ZETIA) 10 MG tablet TAKE 1 TABLET BY MOUTH EVERY DAY 90 tablet 3  . fenofibrate 160 MG tablet TAKE 1 TABLET (160 MG TOTAL) BY MOUTH DAILY. OFFICE  VISIT NEEDED 40 tablet 9  . hydrocortisone 1 % ointment Apply 1 application topically as needed for itching.    . Multiple Vitamin (MULTIVITAMIN) tablet Take 1 tablet by mouth daily.    . Multiple Vitamins-Minerals (PRESERVISION AREDS 2 PO) Take 1 tablet by mouth daily.    Marland Kitchen nystatin (NYSTATIN) powder Apply 1 g topically daily as needed (itching).    . ramipril (ALTACE) 2.5 MG capsule Take 1 capsule (2.5 mg total) by mouth daily. 90 capsule 3  . tamsulosin (FLOMAX) 0.4 MG CAPS capsule Take 0.4 mg by mouth daily.  11  . vitamin C (ASCORBIC ACID) 500 MG tablet Take 500 mg by mouth daily.     No current  facility-administered medications for this visit.    LABS/IMAGING: No results found for this or any previous visit (from the past 48 hour(s)). No results found.  VITALS: BP 98/62   Pulse (!) 59   Ht 5' 10.5" (1.791 m)   Wt 201 lb (91.2 kg)   BMI 28.43 kg/m   EXAM: General appearance: alert, no distress and mildly obese Neck: no carotid bruit and no JVD Lungs: clear to auscultation bilaterally Heart: regular rate and rhythm, S1, S2 normal, no murmur, click, rub or gallop Abdomen: soft, non-tender; bowel sounds normal; no masses,  no organomegaly Extremities: extremities normal, atraumatic, no cyanosis or edema Pulses: 2+ and symmetric Skin: Skin color, texture, turgor normal. No rashes or lesions Neurologic: Grossly normal Psych: pleasant  EKG: Sinus bradycardia with PACs at 59-personally reviewed  ASSESSMENT: 1. Coronary artery disease status post four-vessel CABG in 2002 2. Low risk Myoview in 09/2014 and 12/2016  3. Dyslipidemia 4. Hypertension 5. Borderline diabetes-diet controlled  PLAN: 1.   Mr. Topel continues to do well without anginal complaints. Cholesterol is at goal LDL less than 70.  Blood pressure is well controlled.  Blood sugar is also been closely monitored.  He had stress testing last in 2018.  No changes to his medications today.  Follow-up with me annually  or sooner as necessary.  Pixie Casino, MD, [facility], [place] Director of the Advanced Lipid Disorders &  Cardiovascular Risk Reduction Clinic Diplomate of the American Board of Clinical Lipidology Attending Cardiologist  Direct Dial: [phone]  Fax: [phone]  Website:  [email or web address] 02/28/2020, 9:21 AM

## 2020-03-17 ENCOUNTER — Other Ambulatory Visit: Payer: Self-pay | Admitting: Internal Medicine

## 2020-05-06 DIAGNOSIS — H5213 Myopia, bilateral: Secondary | ICD-10-CM | POA: Insufficient documentation

## 2020-06-18 ENCOUNTER — Other Ambulatory Visit: Payer: Self-pay | Admitting: Internal Medicine

## 2020-07-25 DIAGNOSIS — G4733 Obstructive sleep apnea (adult) (pediatric): Secondary | ICD-10-CM | POA: Diagnosis not present

## 2020-08-07 DIAGNOSIS — G4739 Other sleep apnea: Secondary | ICD-10-CM | POA: Diagnosis not present

## 2020-08-07 DIAGNOSIS — I10 Essential (primary) hypertension: Secondary | ICD-10-CM | POA: Diagnosis not present

## 2020-08-16 ENCOUNTER — Other Ambulatory Visit: Payer: Self-pay | Admitting: Internal Medicine

## 2020-08-25 DIAGNOSIS — G4733 Obstructive sleep apnea (adult) (pediatric): Secondary | ICD-10-CM | POA: Diagnosis not present

## 2020-08-30 ENCOUNTER — Other Ambulatory Visit: Payer: Self-pay | Admitting: Internal Medicine

## 2020-09-22 DIAGNOSIS — G4733 Obstructive sleep apnea (adult) (pediatric): Secondary | ICD-10-CM | POA: Diagnosis not present

## 2020-09-30 ENCOUNTER — Telehealth: Payer: Self-pay | Admitting: Internal Medicine

## 2020-09-30 NOTE — Telephone Encounter (Signed)
Patient approved for zetia tier change to tier 1 until 07/10/2021

## 2020-10-05 DIAGNOSIS — R972 Elevated prostate specific antigen [PSA]: Secondary | ICD-10-CM | POA: Diagnosis not present

## 2020-10-12 DIAGNOSIS — R350 Frequency of micturition: Secondary | ICD-10-CM | POA: Diagnosis not present

## 2020-10-12 DIAGNOSIS — N401 Enlarged prostate with lower urinary tract symptoms: Secondary | ICD-10-CM | POA: Diagnosis not present

## 2020-10-12 DIAGNOSIS — R3915 Urgency of urination: Secondary | ICD-10-CM | POA: Diagnosis not present

## 2020-10-12 DIAGNOSIS — R972 Elevated prostate specific antigen [PSA]: Secondary | ICD-10-CM | POA: Diagnosis not present

## 2020-10-12 DIAGNOSIS — R3912 Poor urinary stream: Secondary | ICD-10-CM | POA: Diagnosis not present

## 2020-10-21 DIAGNOSIS — H5213 Myopia, bilateral: Secondary | ICD-10-CM | POA: Diagnosis not present

## 2020-10-21 DIAGNOSIS — H52223 Regular astigmatism, bilateral: Secondary | ICD-10-CM | POA: Diagnosis not present

## 2020-10-21 DIAGNOSIS — H524 Presbyopia: Secondary | ICD-10-CM | POA: Diagnosis not present

## 2020-11-04 DIAGNOSIS — Z79899 Other long term (current) drug therapy: Secondary | ICD-10-CM | POA: Diagnosis not present

## 2020-11-04 DIAGNOSIS — G4739 Other sleep apnea: Secondary | ICD-10-CM | POA: Diagnosis not present

## 2020-11-04 DIAGNOSIS — I10 Essential (primary) hypertension: Secondary | ICD-10-CM | POA: Diagnosis not present

## 2020-11-09 DIAGNOSIS — H40023 Open angle with borderline findings, high risk, bilateral: Secondary | ICD-10-CM | POA: Diagnosis not present

## 2020-11-09 DIAGNOSIS — H5213 Myopia, bilateral: Secondary | ICD-10-CM | POA: Diagnosis not present

## 2020-11-09 DIAGNOSIS — E119 Type 2 diabetes mellitus without complications: Secondary | ICD-10-CM | POA: Diagnosis not present

## 2020-11-09 DIAGNOSIS — H25813 Combined forms of age-related cataract, bilateral: Secondary | ICD-10-CM | POA: Diagnosis not present

## 2020-11-16 DIAGNOSIS — G4733 Obstructive sleep apnea (adult) (pediatric): Secondary | ICD-10-CM | POA: Diagnosis not present

## 2020-11-18 DIAGNOSIS — I2510 Atherosclerotic heart disease of native coronary artery without angina pectoris: Secondary | ICD-10-CM | POA: Diagnosis not present

## 2020-11-18 DIAGNOSIS — E119 Type 2 diabetes mellitus without complications: Secondary | ICD-10-CM | POA: Diagnosis not present

## 2020-11-18 DIAGNOSIS — I10 Essential (primary) hypertension: Secondary | ICD-10-CM | POA: Diagnosis not present

## 2020-11-18 DIAGNOSIS — E785 Hyperlipidemia, unspecified: Secondary | ICD-10-CM | POA: Diagnosis not present

## 2020-12-15 ENCOUNTER — Telehealth: Payer: Self-pay | Admitting: Internal Medicine

## 2020-12-15 DIAGNOSIS — R251 Tremor, unspecified: Secondary | ICD-10-CM

## 2020-12-15 DIAGNOSIS — I10 Essential (primary) hypertension: Secondary | ICD-10-CM

## 2020-12-15 DIAGNOSIS — Z951 Presence of aortocoronary bypass graft: Secondary | ICD-10-CM

## 2020-12-15 DIAGNOSIS — M62838 Other muscle spasm: Secondary | ICD-10-CM | POA: Diagnosis not present

## 2020-12-15 NOTE — Telephone Encounter (Signed)
I think it is quite unlikely that the symptoms he is describing are related to an arterial blockage in his left arm.  He can do a quick check at home with a blood pressure cuff.  If the blood pressures are equal in the right and left arm, that would confirm that is not a blood supply problem. However, she notices a distinct and consistent reduction in the blood pressure on the left side compared to the right (a 20 mmHg or greater comparative reduction in blood pressure), then we should look into it with an real Doppler study of the upper extremities. My best guess is that his symptoms may be related to some degree of cervical spine disease with a pinched nerve.  A small stroke is another possibility, but since the symptoms have been going on for months, not particularly urgent to pursue that at this time.

## 2020-12-15 NOTE — Telephone Encounter (Signed)
Spoke to patient Dr.Croitoru's advice given.Stated he lives a [facility].He will go to their medical clinic tomorow and have B/P checked in both arms.He will call back to report readings.

## 2020-12-15 NOTE — Telephone Encounter (Signed)
Spoke to patient stated he has been having weakness in left arm since this past January.Stated left arm will move involuntary.No pain.No swelling.No redness.Left arm is weaker than right arm.He does notice left arm is numb at times.He was exercising regularly in Jan and stopped.Stated he noticed some left arm weakness when he was exercising,but is worse now.He saw PCP.Physical therapy ordered.He would like Dr.Hilty's advice if any test need to be done.Advised Dr.Hilty is out of office this week I will send message to Dr.Croitoru who is covering for Dr.Hilty.

## 2020-12-15 NOTE — Telephone Encounter (Signed)
Called patient left message on personal voice mail to call back. 

## 2020-12-15 NOTE — Telephone Encounter (Signed)
Patient called in to say on his life side of his arm has tingling in it. Unsure if it came from him his bypass to where he might have a blockage of some sort

## 2020-12-16 DIAGNOSIS — Z5189 Encounter for other specified aftercare: Secondary | ICD-10-CM | POA: Diagnosis not present

## 2020-12-16 NOTE — Telephone Encounter (Signed)
Returned call to patient who was calling in regard to reporting his blood pressure to Dr. Vonna Drafts. Patient states that he is still intermittently experiencing left arm tingling and experiencing a slight tremor and his left arm moves involuntarily. Patient states this is worse with movement.   Patients blood pressure in both arms.   Left arm 94/60 Right arm 124/60  Advised patient that I would forward message to Dr. Sallyanne Kuster for review and advice.   Patient verbalized understanding.

## 2020-12-16 NOTE — Telephone Encounter (Signed)
Left message to call back regarding MD advice  Test(s) have been ordered Will send staff message to scheduling team once patient is aware of testing

## 2020-12-16 NOTE — Telephone Encounter (Signed)
Please schedule for Duplex ultrasound of the carotids (for his neurological complaints) and of the left upper extremity for subclavian stenosis.

## 2020-12-16 NOTE — Telephone Encounter (Signed)
    Pt c/o BP issue: STAT if pt c/o blurred vision, one-sided weakness or slurred speech  1. What are your last 5 BP readings? Left arm 94/60 Right arm 124/60  2. Are you having any other symptoms (ex. Dizziness, headache, blurred vision, passed out)?   3. What is your BP issue? Pt called to provide BP reading to Dr. Debara Pickett

## 2020-12-17 NOTE — Telephone Encounter (Signed)
Keith Phelps is returning Keith Phelps's call from yesterday evening. He states he will be leaving his home around 8:45 AM this morning. Please advise.

## 2020-12-17 NOTE — Telephone Encounter (Signed)
Returned call to patient, made patient aware of Dr. Victorino December recommendations. Staff message has been sent to scheduling to get patient's tests scheduled. Advised patient to call back with any concerns.   Patient verbalized understanding.

## 2020-12-18 DIAGNOSIS — M62838 Other muscle spasm: Secondary | ICD-10-CM | POA: Diagnosis not present

## 2020-12-21 DIAGNOSIS — M62838 Other muscle spasm: Secondary | ICD-10-CM | POA: Diagnosis not present

## 2020-12-24 DIAGNOSIS — M62838 Other muscle spasm: Secondary | ICD-10-CM | POA: Diagnosis not present

## 2020-12-25 DIAGNOSIS — M62838 Other muscle spasm: Secondary | ICD-10-CM | POA: Diagnosis not present

## 2020-12-28 DIAGNOSIS — M62838 Other muscle spasm: Secondary | ICD-10-CM | POA: Diagnosis not present

## 2020-12-30 DIAGNOSIS — M62838 Other muscle spasm: Secondary | ICD-10-CM | POA: Diagnosis not present

## 2021-01-01 DIAGNOSIS — M62838 Other muscle spasm: Secondary | ICD-10-CM | POA: Diagnosis not present

## 2021-01-04 DIAGNOSIS — M62838 Other muscle spasm: Secondary | ICD-10-CM | POA: Diagnosis not present

## 2021-01-06 DIAGNOSIS — M62838 Other muscle spasm: Secondary | ICD-10-CM | POA: Diagnosis not present

## 2021-01-08 DIAGNOSIS — M62838 Other muscle spasm: Secondary | ICD-10-CM | POA: Diagnosis not present

## 2021-01-12 ENCOUNTER — Other Ambulatory Visit: Payer: Self-pay | Admitting: Cardiovascular Disease

## 2021-01-12 ENCOUNTER — Other Ambulatory Visit: Payer: Self-pay

## 2021-01-12 ENCOUNTER — Ambulatory Visit (HOSPITAL_BASED_OUTPATIENT_CLINIC_OR_DEPARTMENT_OTHER)
Admission: RE | Admit: 2021-01-12 | Discharge: 2021-01-12 | Disposition: A | Discharge status: Home / Self Care | Payer: Medicare HMO | Source: Ambulatory Visit | Attending: Cardiovascular Disease | Admitting: Cardiovascular Disease

## 2021-01-12 ENCOUNTER — Ambulatory Visit (HOSPITAL_COMMUNITY)
Admission: RE | Admit: 2021-01-12 | Discharge: 2021-01-12 | Disposition: A | Discharge status: Home / Self Care | Payer: Medicare HMO | Source: Ambulatory Visit | Attending: Cardiovascular Disease | Admitting: Cardiovascular Disease

## 2021-01-12 DIAGNOSIS — R251 Tremor, unspecified: Secondary | ICD-10-CM

## 2021-01-12 DIAGNOSIS — I998 Other disorder of circulatory system: Secondary | ICD-10-CM | POA: Diagnosis not present

## 2021-01-12 DIAGNOSIS — Z951 Presence of aortocoronary bypass graft: Secondary | ICD-10-CM

## 2021-01-12 DIAGNOSIS — R42 Dizziness and giddiness: Secondary | ICD-10-CM

## 2021-01-12 DIAGNOSIS — I10 Essential (primary) hypertension: Secondary | ICD-10-CM

## 2021-01-13 DIAGNOSIS — M62838 Other muscle spasm: Secondary | ICD-10-CM | POA: Diagnosis not present

## 2021-01-15 DIAGNOSIS — M62838 Other muscle spasm: Secondary | ICD-10-CM | POA: Diagnosis not present

## 2021-01-18 DIAGNOSIS — M62838 Other muscle spasm: Secondary | ICD-10-CM | POA: Diagnosis not present

## 2021-01-22 DIAGNOSIS — R252 Cramp and spasm: Secondary | ICD-10-CM | POA: Diagnosis not present

## 2021-01-22 DIAGNOSIS — M62838 Other muscle spasm: Secondary | ICD-10-CM | POA: Diagnosis not present

## 2021-02-15 ENCOUNTER — Telehealth: Payer: Self-pay | Admitting: Internal Medicine

## 2021-02-15 DIAGNOSIS — E785 Hyperlipidemia, unspecified: Secondary | ICD-10-CM

## 2021-02-15 NOTE — Telephone Encounter (Signed)
Returned call to patient who states that he wanted to see if he needs any blood work done prior to his OV 8/16 with Dr. Debara Pickett. Patient states that if he does need blood work he would like to have it done at his PCP which is [facility] with [facility]. Patient states that the lab orders would need to be faxed there so he can have them done at his PCP appointment this Friday 8/12. The fax number patient provided is [phone]. Advised Patient that I would forward message to Dr. Debara Pickett and his nurse for them to review and advise. Patient verbalized understanding.

## 2021-02-15 NOTE — Telephone Encounter (Signed)
Patient called to see if the lab work that is needed can be release to his PCP by fax so that they can draw his blood. Fax number is [phone]. Please advise

## 2021-02-16 NOTE — Telephone Encounter (Signed)
Yes, would get a repeat lipid profile - his last one was good, but that was 1 year ago.  Dr Lemmie Evens

## 2021-02-16 NOTE — Telephone Encounter (Signed)
Lipid panel ordered/faxed  Patient aware of fasting lab order

## 2021-02-23 ENCOUNTER — Ambulatory Visit: Payer: Medicare HMO | Admitting: Internal Medicine

## 2021-02-23 ENCOUNTER — Encounter: Payer: Self-pay | Admitting: Internal Medicine

## 2021-02-23 ENCOUNTER — Other Ambulatory Visit: Payer: Self-pay

## 2021-02-23 VITALS — BP 100/60 | HR 48 | Ht 70.0 in | Wt 205.4 lb

## 2021-02-23 DIAGNOSIS — R251 Tremor, unspecified: Secondary | ICD-10-CM

## 2021-02-23 DIAGNOSIS — Z951 Presence of aortocoronary bypass graft: Secondary | ICD-10-CM

## 2021-02-23 DIAGNOSIS — I10 Essential (primary) hypertension: Secondary | ICD-10-CM

## 2021-02-23 DIAGNOSIS — E785 Hyperlipidemia, unspecified: Secondary | ICD-10-CM | POA: Diagnosis not present

## 2021-02-23 DIAGNOSIS — R7303 Prediabetes: Secondary | ICD-10-CM

## 2021-02-23 NOTE — Progress Notes (Signed)
OFFICE NOTE  Chief Complaint:  Imbalance  Primary Care Physician: Rikki Spearing, NP  HPI:  Keith Phelps is a 75 year old gentleman with history of coronary disease and CABG in 2002, LIMA to the LAD, SVG to RCA and SVG to ramus and an SVG to the OM. His angiogram in 2006 showed patent vein grafts. He has done well without any symptoms. He is able to exercise without any chest pain or worsening shortness of breath. 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 He also has dyslipidemia and hypertension.  He was recently diagnosed with borderline diabetes now diet controlled. He has made major changes in his diet and exercise to combat this. He reports over the last year he said problems with his shoulders and had arthroscopic surgery on the right shoulder. He been undergoing rehabilitation on and off. He has recently been missing some doses of his TriCor. We recently obtained a lipid profile that showed his total cholesterol 154, triglycerides 147, HDL 40 and LDL 85.  I saw Keith Phelps back in the office today. He is currently without complaints. Unfortunately he's gained some weight and stopped doing so his exercise. He denies any chest pain or significant worsening shortness of breath. His last nuclear stress test was in 2011 and his bypass was in 2002. He's had good cholesterol control and recent laboratory work continues to show his cholesterol is at goal. Unfortunately his A1c is borderline elevated at 5.9 with a fasting glucose of 123. The rest of his laboratory work is within normal limits.  Keith Phelps returns today for follow-up. Overall he is doing well, however he has gained some additional weight. Today he is 205 pounds. Most of this is abdominal weight. He reports less exercise. 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 He does some short sprint type walking but has significantly decreased his exercise. His  last cholesterol testing was well controlled in August however A1c continues to creep up. He is not currently on any oral antidiabetic medications. Blood pressures controlled today. He denies chest pain or shortness of breath.  11/25/2016  Keith Phelps returns today for follow-up. Overall is done well the past year. Unfortunate is continued to gain some weight. He continues to play a lot of gaming and spends time on his box. He's not been very active. He has worsening abdominal girth and is actually noted to have a ventral hernia. He's apparently scheduled to see surgery for evaluation and possible repair. Hemoglobin A1c seems to have around 5.9. His cholesterol is still mildly elevated but fairly well-controlled. He denies any chest pain or worsening shortness of breath.  11/27/2017  Keith Phelps is seen today for follow-up.  This is an annual visit.  He seems to be doing well.  He is in the process of moving to a retirement community.  He is less active than he used to be.  He had a ventral hernia which was repaired.  He denies any chest pain or shortness of breath.  He is overdue for a lipid profile.  EKG today shows sinus bradycardia at 57.  He is on atorvastatin and fenofibrate.   02/25/2019  Keith Phelps returns today for follow-up.  He is continued to do really well.  He lives at [facility] and says that he enjoys his lifestyle there.  He used to be able to go to the gym however now  just walks outside.  After adding ezetimibe to his labs please report of marked improvement in his lipid profile. Labs from a week ago showed total cholesterol 130, triglycerides 133, HDL 39 and LDL 64.  02/27/2019  Keith Phelps is seen today in follow-up.  Overall he continues to do well.  He has had significant improvement in his lipids after adding the ezetimibe.  Most recent labs show total cholesterol 111, triglycerides 111, HDL 40 and LDL 49.  He denies any chest pain or shortness of breath.  He remains physically  active.  02/23/2021  Keith Phelps is seen today in follow-up.  He reports recently he has had some neuromuscular symptoms.  He noted some jerking motion in his left arm.  Ultimately underwent some physical therapy for this but did not have a neurologic evaluation.  He is also had some unusual symptoms in both the right arm and both legs.  He is scheduled to see a neurologist for this but cannot get appointment until September.  He denies any chest pain or shortness of breath.  His lab work seems to be very well controlled.  Particularly his lipid profile showed total cholesterol 110, triglycerides 63, HDL 42 and LDL 55.  A1c is 6.5%.  A1c is 6.5%.  His metabolic profile is essentially normal except for elevated glucose.  PMHx:  Past Medical History:  Diagnosis Date   BPH (benign prostatic hyperplasia)    Coronary artery disease    Dermatitis    saborah   Hyperlipidemia    Sleep apnea    Umbilical hernia     Past Surgical History:  Procedure Laterality Date   APPENDECTOMY     CARDIAC CATHETERIZATION  06/13/2005   [facility]  EF 55%   CHOLECYSTECTOMY     CORONARY ARTERY BYPASS GRAFT  07/18/2000   x 4   DOPPLER ECHOCARDIOGRAPHY  02/21/2012   EF > 55%   DOPPLER ECHOCARDIOGRAPHY  03/16/2010   EF 45-50%   INSERTION OF MESH N/A 01/04/2017   Procedure: INSERTION OF MESH;  Surgeon: Jackolyn Confer, MD;  Location: [facility];  Service: General;  Laterality: N/A;   NM MYOVIEW LTD  03/16/2010   EF 64%  Low risk study   right shoulder arthroscopy  123456   UMBILICAL HERNIA REPAIR N/A 01/04/2017   Procedure: REPAIR CHRONICALLY INCARCERATED UMBILICAL HERNIA;  Surgeon: Jackolyn Confer, MD;  Location: [facility];  Service: General;  Laterality: N/A;    FAMHx:  Family History  Problem Relation Age of Onset   Stroke Father    Heart disease Father     SOCHx:   reports that he has never smoked. He has never used smokeless tobacco. He reports that he does not drink alcohol and does not use  drugs.  ALLERGIES:  Allergies  Allergen Reactions   Doxycycline Itching, Rash and Other (See Comments)    fever    ROS: Pertinent items noted in HPI and remainder of comprehensive ROS otherwise negative.  HOME MEDS: Current Outpatient Medications  Medication Sig Dispense Refill   aspirin EC 81 MG tablet Take 1 tablet (81 mg total) by mouth daily. 90 tablet 3   atorvastatin (LIPITOR) 40 MG tablet TAKE 1 TABLET (40 MG TOTAL) BY MOUTH DAILY. OV NEEDED 90 tablet 2   b complex vitamins tablet Take 1 tablet by mouth daily.     ezetimibe (ZETIA) 10 MG tablet TAKE 1 TABLET BY MOUTH EVERY DAY 90 tablet 3   fenofibrate 160 MG tablet TAKE 1  TABLET (160 MG TOTAL) BY MOUTH DAILY. OFFICE VISIT NEEDED 90 tablet 3   hydrocortisone 1 % ointment Apply 1 application topically as needed for itching.     Multiple Vitamin (MULTIVITAMIN) tablet Take 1 tablet by mouth daily.     Multiple Vitamins-Minerals (PRESERVISION AREDS 2 PO) Take 1 tablet by mouth daily.     nystatin (NYSTATIN) powder Apply 1 g topically daily as needed (itching).     ramipril (ALTACE) 2.5 MG capsule TAKE 1 CAPSULE BY MOUTH EVERY DAY 90 capsule 3   tamsulosin (FLOMAX) 0.4 MG CAPS capsule Take 0.4 mg by mouth daily.  11   vitamin C (ASCORBIC ACID) 500 MG tablet Take 500 mg by mouth daily.     No current facility-administered medications for this visit.    LABS/IMAGING: No results found for this or any previous visit (from the past 48 hour(s)). No results found.  VITALS: There were no vitals taken for this visit.  EXAM: General appearance: alert, no distress and mildly obese Neck: no carotid bruit and no JVD Lungs: clear to auscultation bilaterally Heart: regular rate and rhythm, S1, S2 normal, no murmur, click, rub or gallop Abdomen: soft, non-tender; bowel sounds normal; no masses,  no organomegaly Extremities: extremities normal, atraumatic, no cyanosis or edema Pulses: 2+ and symmetric Skin: Skin color, texture, turgor  normal. No rashes or lesions Neurologic: Grossly normal Psych: pleasant  EKG: Sinus bradycardia at 48 -personally reviewed  ASSESSMENT: Coronary artery disease status post four-vessel CABG in 2002 Low risk Myoview in 09/2014 and 12/2016  Dyslipidemia Hypertension Borderline diabetes-diet controlled  PLAN: 1.   Keith Phelps has been having some unusual neurologic symptoms.  He is scheduled to see a neurologist in September.  He underwent some therapy with minimal improvement.  He is noted to have some bradycardia today and is asymptomatic with this.  He notes his heart rate does augment with exercise.  He should continue to monitor that.  He is on no AV nodal blockers.  Cholesterol is well controlled.  His blood pressure is at goal.  No changes in his medicines today.  Follow-up annually or sooner as necessary.  Pixie Casino, MD, [facility], [place] Director of the Advanced Lipid Disorders &  Cardiovascular Risk Reduction Clinic Diplomate of the American Board of Clinical Lipidology Attending Cardiologist  Direct Dial: [phone]  Fax: [phone]  Website:  [email or web address] 02/23/2021, 10:08 AM

## 2021-02-23 NOTE — Patient Instructions (Signed)
  Follow-Up: At [facility], you and your health needs are our priority.  As part of our continuing mission to provide you with exceptional heart care, we have created designated Provider Care Teams.  These Care Teams include your primary Cardiologist (physician) and Advanced Practice Providers (APPs -  Physician Assistants and Nurse Practitioners) who all work together to provide you with the care you need, when you need it.  We recommend signing up for the patient portal called "MyChart".  Sign up information is provided on this After Visit Summary.  MyChart is used to connect with patients for Virtual Visits (Telemedicine).  Patients are able to view lab/test results, encounter notes, upcoming appointments, etc.  Non-urgent messages can be sent to your provider as well.   To learn more about what you can do with MyChart, go to [email or web address].    Your next appointment:   12 month(s)  The format for your next appointment:   In Person  Provider:   K. Mali Hilty, MD

## 2021-03-06 ENCOUNTER — Other Ambulatory Visit: Payer: Self-pay | Admitting: Internal Medicine

## 2021-03-30 DIAGNOSIS — R531 Weakness: Secondary | ICD-10-CM | POA: Insufficient documentation

## 2021-03-30 DIAGNOSIS — R29898 Other symptoms and signs involving the musculoskeletal system: Secondary | ICD-10-CM | POA: Insufficient documentation

## 2021-03-30 DIAGNOSIS — R200 Anesthesia of skin: Secondary | ICD-10-CM | POA: Insufficient documentation

## 2021-04-13 ENCOUNTER — Other Ambulatory Visit: Payer: Self-pay | Admitting: Internal Medicine

## 2021-06-18 ENCOUNTER — Telehealth: Payer: Self-pay | Admitting: Internal Medicine

## 2021-06-18 NOTE — Telephone Encounter (Signed)
Pt informed of providers result & recommendations. Pt verbalized understanding. No further questions . ? ?

## 2021-06-18 NOTE — Telephone Encounter (Signed)
If patient has been described Paxlovid, is ok to hold atorvastatin during treatment and then restart once Paxlovid therapy is complete.  No need to hold ezetimibe.

## 2021-06-18 NOTE — Telephone Encounter (Signed)
Patient is calling to find out if it would be okay not to take atorvastatin (LIPITOR) 40 MG tablet and ezetimibe (ZETIA) 10 MG tablet for two weeks, so he can receive treatment for COVID.

## 2021-07-08 ENCOUNTER — Other Ambulatory Visit: Payer: Self-pay | Admitting: Internal Medicine

## 2021-08-03 DIAGNOSIS — Z79899 Other long term (current) drug therapy: Secondary | ICD-10-CM | POA: Insufficient documentation

## 2021-08-03 DIAGNOSIS — R278 Other lack of coordination: Secondary | ICD-10-CM | POA: Insufficient documentation

## 2021-08-10 DIAGNOSIS — R2681 Unsteadiness on feet: Secondary | ICD-10-CM | POA: Insufficient documentation

## 2021-08-10 DIAGNOSIS — I4892 Unspecified atrial flutter: Secondary | ICD-10-CM | POA: Insufficient documentation

## 2021-08-10 DIAGNOSIS — G8918 Other acute postprocedural pain: Secondary | ICD-10-CM | POA: Insufficient documentation

## 2021-08-10 DIAGNOSIS — M6281 Muscle weakness (generalized): Secondary | ICD-10-CM | POA: Insufficient documentation

## 2021-08-10 DIAGNOSIS — I2692 Saddle embolus of pulmonary artery without acute cor pulmonale: Secondary | ICD-10-CM | POA: Insufficient documentation

## 2021-08-10 DIAGNOSIS — R338 Other retention of urine: Secondary | ICD-10-CM | POA: Insufficient documentation

## 2021-08-10 DIAGNOSIS — I4891 Unspecified atrial fibrillation: Secondary | ICD-10-CM | POA: Insufficient documentation

## 2021-08-10 DIAGNOSIS — R2689 Other abnormalities of gait and mobility: Secondary | ICD-10-CM | POA: Insufficient documentation

## 2021-08-14 ENCOUNTER — Other Ambulatory Visit: Payer: Self-pay | Admitting: Internal Medicine

## 2021-08-17 NOTE — Progress Notes (Signed)
Cardiology Office Note:    Date:  08/18/2021   ID:  Keith Phelps, DOB 28-Apr-1946, MRN [id number]  PCP:  Rikki Spearing, NP [place] Cardiologist: Pixie Casino, MD   Reason for visit: Hospital follow-up  History of Present Illness:    Keith Phelps is a 76 y.o. male with a hx of CAD s/p CABG in 2002, LIMA to the LAD, SVG to RCA and SVG to ramus and an SVG to the OM, hyperlipidemia, hypertension, diet-controlled diabetes.  He was Health visitor.  He was last seen in August 2022 by Dr. Debara Pickett.  He was doing well from a cardiovascular standpoint.  He underwent cervical surgery for severe cervical spondylotic myelopathy and radiculopathy on 07/27/2021.  He was discharged but shortly returned back to the ED with a chest pain and syncopal episode.  He was found diaphoretic and hypotensive by EMS.  CTA chest showed saddle PE with right heart strain.  He underwent aspiration thrombectomy.    During his hospitalization, he had atrial flutter with RVR.  He was placed on amio drip.  He was in sinus rhythm on discharge with amiodarone 200 mg p.o. daily.  He was discharged on Eliquis.  Discharged to SNF.  Today, he feels grateful that he survived.  He is currently at a SNF.  With physical therapy, he is working on moving from the bed to the toilet with a walker.    From a cardiac standpoint, he denies palpitations.  He is in normal sinus rhythm today.  He was concerned about bleeding risk with Eliquis and if he is able to do normal activities.  He was also concerned about RV strain on echo.  He denies chest pain since hospital discharge.  His breathing is okay overall.  He did not require oxygen post discharge.  Denies lower extremity edema and weight gain/bloating.    Past Medical History:  Diagnosis Date   BPH (benign prostatic hyperplasia)    Coronary artery disease    Dermatitis    saborah   Hyperlipidemia    Sleep apnea    Umbilical hernia     Past Surgical  History:  Procedure Laterality Date   APPENDECTOMY     CARDIAC CATHETERIZATION  06/13/2005   [facility]  EF 55%   CHOLECYSTECTOMY     CORONARY ARTERY BYPASS GRAFT  07/18/2000   x 4   DOPPLER ECHOCARDIOGRAPHY  02/21/2012   EF > 55%   DOPPLER ECHOCARDIOGRAPHY  03/16/2010   EF 45-50%   INSERTION OF MESH N/A 01/04/2017   Procedure: INSERTION OF MESH;  Surgeon: Jackolyn Confer, MD;  Location: [facility];  Service: General;  Laterality: N/A;   NM MYOVIEW LTD  03/16/2010   EF 64%  Low risk study   right shoulder arthroscopy  5462   UMBILICAL HERNIA REPAIR N/A 01/04/2017   Procedure: REPAIR CHRONICALLY INCARCERATED UMBILICAL HERNIA;  Surgeon: Jackolyn Confer, MD;  Location: [facility];  Service: General;  Laterality: N/A;    Current Medications: Current Meds  Medication Sig   alfuzosin (UROXATRAL) 10 MG 24 hr tablet Take 10 mg by mouth daily.   amiodarone (PACERONE) 200 MG tablet Take by mouth.   apixaban (ELIQUIS) 5 MG TABS tablet Take by mouth.   atorvastatin (LIPITOR) 40 MG tablet TAKE 1 TABLET (40 MG TOTAL) BY MOUTH DAILY. OV NEEDED   B Complex-C (B-COMPLEX WITH VITAMIN C) tablet Take 1 tablet by mouth daily.   ezetimibe (ZETIA) 10 MG tablet TAKE 1  TABLET BY MOUTH EVERY DAY   fenofibrate 160 MG tablet TAKE 1 TABLET (160 MG TOTAL) BY MOUTH DAILY. *OFFICE VISIT NEEDED*   hydrocortisone 1 % ointment Apply 1 application topically as needed for itching.   nystatin (MYCOSTATIN/NYSTOP) powder Apply 1 g topically daily as needed (itching).   pantoprazole (PROTONIX) 40 MG tablet Take 40 mg by mouth daily. 1 Tablet Daily.   vitamin C (ASCORBIC ACID) 500 MG tablet Take 500 mg by mouth daily.     Allergies:   Doxycycline   Social History   Socioeconomic History   Marital status: Divorced    Spouse name: Not on file   Number of children: Not on file   Years of education: Not on file   Highest education level: Not on file  Occupational History   Not on file  Tobacco Use   Smoking status:  Never   Smokeless tobacco: Never  Vaping Use   Vaping Use: Never used  Substance and Sexual Activity   Alcohol use: No   Drug use: No   Sexual activity: Not on file  Other Topics Concern   Not on file  Social History Narrative   Not on file   Social Determinants of Health   Financial Resource Strain: Not on file  Food Insecurity: Not on file  Transportation Needs: Not on file  Physical Activity: Not on file  Stress: Not on file  Social Connections: Not on file     Family History: The patient's family history includes Heart disease in his father; Stroke in his father.  ROS:   Please see the history of present illness.     EKGs/Labs/Other Studies Reviewed:    EKG:  The ekg ordered today demonstrates normal sinus rhythm, heart rate 83, PR interval 166 ms, QRS duration 96 ms.  Recent Labs: No results found for requested labs within last 8760 hours.   Recent Lipid Panel Lab Results  Component Value Date/Time   CHOL 130 02/19/2019 09:05 AM   TRIG 133 02/19/2019 09:05 AM   HDL 39 (L) 02/19/2019 09:05 AM   LDLCALC 64 02/19/2019 09:05 AM   LDLCALC 103 (H) 05/07/2018 12:55 PM    Physical Exam:    VS:  BP 124/76    Pulse 83    Ht 5\' 10"  (1.778 m)    Wt 187 lb (84.8 kg)    SpO2 96%    BMI 26.83 kg/m    No data found.  Wt Readings from Last 3 Encounters:  08/18/21 187 lb (84.8 kg)  02/23/21 205 lb 6.4 oz (93.2 kg)  02/27/20 201 lb (91.2 kg)     GEN:  Well nourished, well developed in no acute distress HEENT: Normal NECK: No JVD; No carotid bruits; Neck incision, in neck brace CARDIAC: RRR, no murmurs, rubs, gallops RESPIRATORY:  Clear to auscultation without rales, wheezing or rhonchi  ABDOMEN: Soft, non-tender, non-distended MUSCULOSKELETAL: No edema; No deformity  SKIN: Warm and dry NEUROLOGIC:  Alert and oriented PSYCHIATRIC:  Normal affect     ASSESSMENT AND PLAN   Saddle pulmonary embolism with RV strain -Post cervical surgery in January 2023 -Treated  with aspiration thrombectomy -Continue Eliquis likely indefinitely.  Reassured able to most activities while taking Eliquis - avoid high risk activities like motorcycling, skiing, etc.  Avoid NSAIDs.   -Patient is interested if we could repeat 2D echo in 6 months to see if RV has recovered.  No heart failure symptoms.  Paroxysmal atrial flutter, in normal sinus rhythm today -  In the setting of severe PE -Continue anticoagulation for stroke prevention. -Plan to continue amiodarone 200 mg once daily until April 1; completing 8 weeks of treatment.  CAD with no angina -Status post CABG in 2002 -Aspirin discontinued as he is now on Eliquis. -Continue lipid therapy. -Ramipril discontinued secondary to hypotension with his PE.  Blood pressure is controlled without any BP medications currently.  Hyperlipidemia, well controlled -Total cholesterol 110, LDL 55, triglycerides 63, HDL 42 in April 2022. -Continue Lipitor, Zetia and fenofibrate as prescribed by Dr. Debara Pickett.  OSA -Patient plans to restart CPAP after discharge from SNF/recovers from cervical surgery.  Disposition - Follow-up in 3 months with Dr. Debara Pickett.     Medication Adjustments/Labs and Tests Ordered: Current medicines are reviewed at length with the patient today.  Concerns regarding medicines are outlined above.  Orders Placed This Encounter  Procedures   EKG 12-Lead   No orders of the defined types were placed in this encounter.   Patient Instructions  Medication Instructions:  Continue Amiodarone 200 mg ( 1 Tablet Daily. Until April 1). Continue Eliquis 5 mg ( 1 Tablet Twice Daily . Long Term). *If you need a refill on your cardiac medications before your next appointment, please call your pharmacy*   Lab Work: No Labs If you have labs (blood work) drawn today and your tests are completely normal, you will receive your results only by: [place] (if you have MyChart) OR A paper copy in the mail If you have any  lab test that is abnormal or we need to change your treatment, we will call you to review the results.   Testing/Procedures: No Testing    Follow-Up: At [facility], you and your health needs are our priority.  As part of our continuing mission to provide you with exceptional heart care, we have created designated Provider Care Teams.  These Care Teams include your primary Cardiologist (physician) and Advanced Practice Providers (APPs -  Physician Assistants and Nurse Practitioners) who all work together to provide you with the care you need, when you need it.  We recommend signing up for the patient portal called "MyChart".  Sign up information is provided on this After Visit Summary.  MyChart is used to connect with patients for Virtual Visits (Telemedicine).  Patients are able to view lab/test results, encounter notes, upcoming appointments, etc.  Non-urgent messages can be sent to your provider as well.   To learn more about what you can do with MyChart, go to [email or web address].    Your next appointment:   3 month(s)  The format for your next appointment:   In Person  Provider:   Pixie Casino, MD         Signed, Warren Lacy, PA-C  08/18/2021 12:34 PM    [place]

## 2021-08-18 ENCOUNTER — Encounter: Payer: Self-pay | Admitting: Physician Assistant

## 2021-08-18 ENCOUNTER — Other Ambulatory Visit: Payer: Self-pay

## 2021-08-18 ENCOUNTER — Ambulatory Visit: Payer: Medicare HMO | Admitting: Physician Assistant

## 2021-08-18 VITALS — BP 124/76 | HR 83 | Ht 70.0 in | Wt 187.0 lb

## 2021-08-18 DIAGNOSIS — E785 Hyperlipidemia, unspecified: Secondary | ICD-10-CM

## 2021-08-18 DIAGNOSIS — I4892 Unspecified atrial flutter: Secondary | ICD-10-CM

## 2021-08-18 DIAGNOSIS — I10 Essential (primary) hypertension: Secondary | ICD-10-CM | POA: Diagnosis not present

## 2021-08-18 DIAGNOSIS — I2510 Atherosclerotic heart disease of native coronary artery without angina pectoris: Secondary | ICD-10-CM

## 2021-08-18 DIAGNOSIS — I2692 Saddle embolus of pulmonary artery without acute cor pulmonale: Secondary | ICD-10-CM

## 2021-08-18 NOTE — Patient Instructions (Signed)
Medication Instructions:  Continue Amiodarone 200 mg ( 1 Tablet Daily. Until April 1). Continue Eliquis 5 mg ( 1 Tablet Twice Daily . Long Term). *If you need a refill on your cardiac medications before your next appointment, please call your pharmacy*   Lab Work: No Labs If you have labs (blood work) drawn today and your tests are completely normal, you will receive your results only by: [place] (if you have MyChart) OR A paper copy in the mail If you have any lab test that is abnormal or we need to change your treatment, we will call you to review the results.   Testing/Procedures: No Testing    Follow-Up: At [facility], you and your health needs are our priority.  As part of our continuing mission to provide you with exceptional heart care, we have created designated Provider Care Teams.  These Care Teams include your primary Cardiologist (physician) and Advanced Practice Providers (APPs -  Physician Assistants and Nurse Practitioners) who all work together to provide you with the care you need, when you need it.  We recommend signing up for the patient portal called "MyChart".  Sign up information is provided on this After Visit Summary.  MyChart is used to connect with patients for Virtual Visits (Telemedicine).  Patients are able to view lab/test results, encounter notes, upcoming appointments, etc.  Non-urgent messages can be sent to your provider as well.   To learn more about what you can do with MyChart, go to [email or web address].    Your next appointment:   3 month(s)  The format for your next appointment:   In Person  Provider:   Pixie Casino, MD

## 2021-08-20 ENCOUNTER — Other Ambulatory Visit: Payer: Self-pay

## 2021-08-20 DIAGNOSIS — I2510 Atherosclerotic heart disease of native coronary artery without angina pectoris: Secondary | ICD-10-CM

## 2021-08-30 ENCOUNTER — Other Ambulatory Visit: Payer: Self-pay

## 2021-08-30 ENCOUNTER — Ambulatory Visit (HOSPITAL_COMMUNITY)
Admission: RE | Admit: 2021-08-30 | Discharge: 2021-08-30 | Disposition: A | Discharge status: Home / Self Care | Payer: Medicare HMO | Source: Ambulatory Visit | Attending: Physician Assistant | Admitting: Physician Assistant

## 2021-08-30 DIAGNOSIS — I2510 Atherosclerotic heart disease of native coronary artery without angina pectoris: Secondary | ICD-10-CM | POA: Insufficient documentation

## 2021-08-30 DIAGNOSIS — I371 Nonrheumatic pulmonary valve insufficiency: Secondary | ICD-10-CM | POA: Insufficient documentation

## 2021-08-30 DIAGNOSIS — Z951 Presence of aortocoronary bypass graft: Secondary | ICD-10-CM | POA: Diagnosis not present

## 2021-08-30 DIAGNOSIS — E785 Hyperlipidemia, unspecified: Secondary | ICD-10-CM | POA: Insufficient documentation

## 2021-08-30 LAB — ECHOCARDIOGRAM COMPLETE
Area-P 1/2: 3.34 cm2
S' Lateral: 2.9 cm

## 2021-09-01 ENCOUNTER — Telehealth: Payer: Self-pay

## 2021-09-01 NOTE — Telephone Encounter (Addendum)
Called patient regarding results. Left message for patient to call office.----- Message from Warren Lacy, PA-C sent at 08/31/2021  2:20 PM EST ----- While this was done before my original plan but excellent news your normal regular right ventricle function is normal.   Your left side also squeezes normally.  There is no wall motion abnormalities.  Your heart valves are normal.  There is no fluid around the heart.  There is no ascending aortic aneurysm.  A great looking echocardiogram.  You have healed well after your pulmonary embolism.

## 2021-09-02 ENCOUNTER — Telehealth: Payer: Self-pay | Admitting: Physician Assistant

## 2021-09-02 NOTE — Telephone Encounter (Addendum)
Patient returned call regarding results. Patient has understanding of results.----- Message from Warren Lacy, PA-C sent at 08/31/2021  2:20 PM EST ----- While this was done before my original plan but excellent news your normal regular right ventricle function is normal.   Your left side also squeezes normally.  There is no wall motion abnormalities.  Your heart valves are normal.  There is no fluid around the heart.  There is no ascending aortic aneurysm.  A great looking echocardiogram.  You have healed well after your pulmonary embolism.

## 2021-09-02 NOTE — Telephone Encounter (Signed)
Spoke with patient regarding the following results. Patient made aware and patient verbalized understanding.   Warren Lacy, PA-C  08/31/2021  2:20 PM EST     While this was done before my original plan but excellent news your normal regular right ventricle function is normal.   Your left side also squeezes normally.  There is no wall motion abnormalities.  Your heart valves are normal.  There is no fluid around the heart.  There is no ascending aortic aneurysm.  A great looking echocardiogram.  You have healed well after your pulmonary embolism.

## 2021-09-02 NOTE — Telephone Encounter (Signed)
Follow Up:     Patient is returning Stephanie's call from yesterday.

## 2021-09-23 ENCOUNTER — Telehealth: Payer: Self-pay

## 2021-09-23 NOTE — Telephone Encounter (Signed)
Patient states that his new doctor asked him about glaucoma that was found in his medical record recorded by Dr Renold Genta. He states 06/06/15. He states that he has never had glaucoma and would like this removed from his chart. Please advise, he would like a call with the outcome and it is ok to [facility] if needed.  ?

## 2021-09-23 NOTE — Telephone Encounter (Signed)
Removed by Dr Renold Genta. Patient is aware.  ?

## 2021-10-11 ENCOUNTER — Encounter: Payer: Self-pay | Admitting: Internal Medicine

## 2021-11-16 NOTE — Progress Notes (Addendum)
?Cardiology Office Note:   ? ?Date:  11/17/2021  ? ?ID:  Keith Phelps, DOB 1946/01/01, MRN [id number] ? ?PCP:  Rikki Spearing, NP ?[place] Cardiologist: Pixie Casino, MD  ? ?Reason for visit: 55-monthfollow-up ? ?History of Present Illness:   ? ?WBRADFORD CAZIERis a 76y.o. male with a hx of CAD s/p CABG in 2002, LIMA to the LAD, SVG to RCA and SVG to ramus and an SVG to the OM, hyperlipidemia, hypertension, diet-controlled diabetes.  He was sHealth visitor ?   ?He underwent cervical surgery for severe cervical spondylotic myelopathy and radiculopathy on 07/27/2021.  He was discharged but shortly returned back to the ED with a chest pain and syncopal episode.  He was found diaphoretic and hypotensive by EMS.  CTA chest showed saddle PE with right heart strain.  He underwent aspiration thrombectomy.    During his hospitalization, he had atrial flutter with RVR.  He was placed on amio drip.  He was in sinus rhythm on discharge with amiodarone 200 mg p.o. daily.  He was discharged on Eliquis.  Discharged to SNF. ?  ?I saw him in February 2023.  With physical therapy, he was working on moving from the bed to the toilet with a walker at a SNF.  He denied palpitations & chest pain. ? ?Today, patient states great improvement since February.  He states he is still working on his endurance.  He can walk a mile in 14 minutes and then would feel winded.  He walks unassisted.  He still has some fine motor difficulty including tying his shoes and zipping a jacket occasionally.  He has restarted chair volleyball.   ? ?His only complaint is left lower extremity swelling that has occurred since his PE.  He wonders if is secondary to Eliquis.  He had an ultrasound showing no DVT.  This is the same leg that he had a vein grafting for his CABG.  He is trying to ambulate more which has helped the swelling. ? ?Patient denies PND and orthopnea.  No chest pain.  He is not using his CPAP.  He sleeps with his  head elevated and states he has not had apneic episodes.  Denies palpitations. ?  ?  ?Past Medical History:  ?Diagnosis Date  ? BPH (benign prostatic hyperplasia)   ? Coronary artery disease   ? Dermatitis   ? saborah  ? Hyperlipidemia   ? Sleep apnea   ? Umbilical hernia   ? ? ?Past Surgical History:  ?Procedure Laterality Date  ? APPENDECTOMY    ? [place]  [place]  ? CHOLECYSTECTOMY    ? CORONARY ARTERY BYPASS GRAFT  07/18/2000  ? x 4  ? DOPPLER ECHOCARDIOGRAPHY  02/21/2012  ? EF > 55%  ? DOPPLER ECHOCARDIOGRAPHY  03/16/2010  ? EF 45-50%  ? INSERTION OF MESH N/A 01/04/2017  ? Procedure: INSERTION OF MESH;  Surgeon: RJackolyn Confer MD;  Location: [facility];  Service: General;  Laterality: N/A;  ? NM MYOVIEW LTD  03/16/2010  ? EF 64%  Low risk study  ? right shoulder arthroscopy  2015  ? UMBILICAL HERNIA REPAIR N/A 01/04/2017  ? Procedure: REPAIR CHRONICALLY INCARCERATED UMBILICAL HERNIA;  Surgeon: RJackolyn Confer MD;  Location: [facility];  Service: General;  Laterality: N/A;  ? ? ?Current Medications: ?Current Meds  ?Medication Sig  ? alfuzosin (UROXATRAL) 10 MG 24 hr tablet Take 10 mg by mouth  daily.  ? apixaban (ELIQUIS) 5 MG TABS tablet Take by mouth.  ? atorvastatin (LIPITOR) 40 MG tablet TAKE 1 TABLET (40 MG TOTAL) BY MOUTH DAILY. OV NEEDED  ? B Complex-C (B-COMPLEX WITH VITAMIN C) tablet Take 1 tablet by mouth daily.  ? ezetimibe (ZETIA) 10 MG tablet TAKE 1 TABLET BY MOUTH EVERY DAY  ? fenofibrate 160 MG tablet TAKE 1 TABLET (160 MG TOTAL) BY MOUTH DAILY. *OFFICE VISIT NEEDED*  ? hydrocortisone 1 % ointment Apply 1 application topically as needed for itching.  ? nystatin (MYCOSTATIN/NYSTOP) powder Apply 1 g topically daily as needed (itching).  ? pantoprazole (PROTONIX) 40 MG tablet Take 40 mg by mouth daily. 1 Tablet Daily.  ? vitamin C (ASCORBIC ACID) 500 MG tablet Take 500 mg by mouth daily.  ?  ? ?Allergies:   Doxycycline  ? ?Social History  ? ?Socioeconomic History   ? Marital status: Divorced  ?  Spouse name: Not on file  ? Number of children: Not on file  ? Years of education: Not on file  ? Highest education level: Not on file  ?Occupational History  ? Not on file  ?Tobacco Use  ? Smoking status: Never  ? Smokeless tobacco: Never  ?Vaping Use  ? Vaping Use: Never used  ?Substance and Sexual Activity  ? Alcohol use: No  ? Drug use: No  ? Sexual activity: Not on file  ?Other Topics Concern  ? Not on file  ?Social History Narrative  ? Not on file  ? ?Social Determinants of Health  ? ?Financial Resource Strain: Not on file  ?Food Insecurity: Not on file  ?Transportation Needs: Not on file  ?Physical Activity: Not on file  ?Stress: Not on file  ?Social Connections: Not on file  ?  ? ?Family History: ?The patient's family history includes Heart disease in his father; Stroke in his father. ? ?ROS:   ?Please see the history of present illness.    ? ?EKGs/Labs/Other Studies Reviewed:   ? ?Recent Labs: ?No results found for requested labs within last 8760 hours.  ? ?Recent Lipid Panel ?Lab Results  ?Component Value Date/Time  ? CHOL 130 02/19/2019 09:05 AM  ? TRIG 133 02/19/2019 09:05 AM  ? HDL 39 (L) 02/19/2019 09:05 AM  ? LDLCALC 64 02/19/2019 09:05 AM  ? LDLCALC 103 (H) 05/07/2018 12:55 PM  ? ? ?Physical Exam:   ? ?VS:  BP 118/68   Pulse 69   Ht '5\' 10"'$  (1.778 m)   Wt 199 lb 6.4 oz (90.4 kg)   SpO2 96%   BMI 28.61 kg/m?    ?No data found. ? ?Wt Readings from Last 3 Encounters:  ?11/17/21 199 lb 6.4 oz (90.4 kg)  ?08/18/21 187 lb (84.8 kg)  ?02/23/21 205 lb 6.4 oz (93.2 kg)  ?  ? ?GEN: Well nourished, well developed in no acute distress ?HEENT: Normal ?NECK: No JVD; No carotid bruits ?CARDIAC: RRR, no murmurs, rubs, gallops ?RESPIRATORY:  Clear to auscultation without rales, wheezing or rhonchi  ?ABDOMEN: Soft, non-tender, non-distended ?MUSCULOSKELETAL: Trace-1+ LLE edema; No deformity  ?SKIN: Warm and dry ?NEUROLOGIC:  Alert and oriented ?PSYCHIATRIC:  Normal affect  ? ?   ?ASSESSMENT AND PLAN  ? ?Saddle pulmonary embolism with RV strain ?-Post cervical surgery in January 2023 ?-Treated with aspiration thrombectomy ?-Repeat echo in 08/2021 with normal RV ?-Continue Eliquis ?  ?Paroxysmal atrial flutter, RRR by exam ?-In the setting of severe PE ?-Continue anticoagulation for stroke prevention. ?-Completed 8 weeks of amiodarone. ?-  After speaking with EP and Dr. Debara Pickett, we will plan on loop recorder implant. If no recurrent arrhythmias, we will consider discontinuing Eliquis in January 2024. ?  ?CAD with no angina ?-Status post CABG in 2002 ?-No aspirin as he is on anticoagulation. ?-Continue lipid therapy. ?  ?Hyperlipidemia, well controlled ?-Total cholesterol 110, LDL 55, triglycerides 63, HDL 42 in April 2022. ?-Continue Lipitor, Zetia and fenofibrate as prescribed by Dr. Debara Pickett. ? ?Disposition - Follow-up in 6 months with Dr. Debara Pickett. ? ? ?Patient Instructions  ?Medication Instructions:  ?No Changes ?*If you need a refill on your cardiac medications before your next appointment, please call your pharmacy* ? ? ?Lab Work: ?No Labs ?If you have labs (blood work) drawn today and your tests are completely normal, you will receive your results only by: ?MyChart Message (if you have MyChart) OR ?A paper copy in the mail ?If you have any lab test that is abnormal or we need to change your treatment, we will call you to review the results. ? ? ?Testing/Procedures: ?No Testing ? ? ?Follow-Up: ?At [facility], you and your health needs are our priority.  As part of our continuing mission to provide you with exceptional heart care, we have created designated Provider Care Teams.  These Care Teams include your primary Cardiologist (physician) and Advanced Practice Providers (APPs -  Physician Assistants and Nurse Practitioners) who all work together to provide you with the care you need, when you need it. ? ?We recommend signing up for the patient portal called "MyChart".  Sign up information is  provided on this After Visit Summary.  MyChart is used to connect with patients for Virtual Visits (Telemedicine).  Patients are able to view lab/test results, encounter notes, upcoming appointments, etc.  Non-u

## 2021-11-17 ENCOUNTER — Ambulatory Visit: Payer: Medicare HMO | Admitting: Physician Assistant

## 2021-11-17 ENCOUNTER — Encounter: Payer: Self-pay | Admitting: Physician Assistant

## 2021-11-17 VITALS — BP 118/68 | HR 69 | Ht 70.0 in | Wt 199.4 lb

## 2021-11-17 DIAGNOSIS — I2510 Atherosclerotic heart disease of native coronary artery without angina pectoris: Secondary | ICD-10-CM

## 2021-11-17 DIAGNOSIS — E785 Hyperlipidemia, unspecified: Secondary | ICD-10-CM

## 2021-11-17 DIAGNOSIS — I4892 Unspecified atrial flutter: Secondary | ICD-10-CM

## 2021-11-17 DIAGNOSIS — I10 Essential (primary) hypertension: Secondary | ICD-10-CM

## 2021-11-17 NOTE — Patient Instructions (Signed)
Medication Instructions:  ?No Changes ?*If you need a refill on your cardiac medications before your next appointment, please call your pharmacy* ? ? ?Lab Work: ?No Labs ?If you have labs (blood work) drawn today and your tests are completely normal, you will receive your results only by: ?MyChart Message (if you have MyChart) OR ?A paper copy in the mail ?If you have any lab test that is abnormal or we need to change your treatment, we will call you to review the results. ? ? ?Testing/Procedures: ?No Testing ? ? ?Follow-Up: ?At [facility], you and your health needs are our priority.  As part of our continuing mission to provide you with exceptional heart care, we have created designated Provider Care Teams.  These Care Teams include your primary Cardiologist (physician) and Advanced Practice Providers (APPs -  Physician Assistants and Nurse Practitioners) who all work together to provide you with the care you need, when you need it. ? ?We recommend signing up for the patient portal called "MyChart".  Sign up information is provided on this After Visit Summary.  MyChart is used to connect with patients for Virtual Visits (Telemedicine).  Patients are able to view lab/test results, encounter notes, upcoming appointments, etc.  Non-urgent messages can be sent to your provider as well.   ?To learn more about what you can do with MyChart, go to [email or web address].   ? ?Your next appointment:   ?6 month(s) ? ?The format for your next appointment:   ?In Person ? ?Provider:   ?Pixie Casino, MD   ? ? ?Melvenia Needles will call you regarding Loop Recorder. Recommend Compression Stockings as tolerated. ? ?Important Information About [place] ? ? ? ? ?  ?

## 2021-11-17 NOTE — Progress Notes (Signed)
Okay-if he is interested in that route seems reasonable to me to do the loop recorder. ? ?Thanks, [place] ?

## 2022-01-06 ENCOUNTER — Ambulatory Visit: Payer: Medicare HMO | Admitting: Cardiology

## 2022-01-06 ENCOUNTER — Encounter: Payer: Self-pay | Admitting: Cardiology

## 2022-01-06 VITALS — BP 104/68 | Ht 70.0 in | Wt 200.0 lb

## 2022-01-06 DIAGNOSIS — I10 Essential (primary) hypertension: Secondary | ICD-10-CM

## 2022-01-06 DIAGNOSIS — Z951 Presence of aortocoronary bypass graft: Secondary | ICD-10-CM

## 2022-01-06 DIAGNOSIS — I2510 Atherosclerotic heart disease of native coronary artery without angina pectoris: Secondary | ICD-10-CM

## 2022-01-06 NOTE — Progress Notes (Signed)
Electrophysiology Office Note:    Date:  01/06/2022   ID:  Keith Phelps, DOB [birth year], MRN [id number]  PCP:  Rikki Spearing, NP  [facility] Cardiologist:  Pixie Casino, MD  [place] Electrophysiologist:  None   Referring MD: Rikki Spearing, NP   Chief Complaint: New patient consult for ILR  History of Present Illness:    Keith Phelps is a 76 y.o. male who presents for an evaluation for ILR implant at the request of Caron Presume, PA-C. Their medical history includes CAD s/p CABG 2002 (LIMA to the LAD, SVG to RCA and SVG to ramus and an SVG to the OM), hypertension, hyperlipidemia, diet-controlled diabetes, and sleep apnea.  Shortly after his cervical surgery 07/27/2021 he returned to the ED with chest pain and syncope. CTA chest showed saddle PE with right heart strain; he underwent aspiration thrombectomy. While in the hospital he had atrial flutter with RVR and was placed on amio drip. On discharge he was in sinus rhythm with 200 mg amiodarone daily and Eliquis.   He saw Caron Presume, PA-C on 11/17/2021 and reported continued improvement since his hospitalization. After discussion with EP and Dr. Debara Pickett, planned on loop recorder implant. If no recurrent arrhythmias, will consider discontinuing Eliquis in 07/2022.  Today, he is accompanied by a family member. Lately he has been walking every day and participating in balance classes. 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  Generally his breathing is okay if he isn't going uphill. He will become fatigued if he exercises.  He notes he is out of Eliquis at this time due to difficulties at the pharmacy.  They deny any palpitations, chest pain, or peripheral edema. No lightheadedness, headaches, syncope, orthopnea, or PND.      Past Medical History:  Diagnosis Date   BPH (benign prostatic hyperplasia)    Coronary artery disease    Dermatitis    saborah   Hyperlipidemia    Sleep apnea     Umbilical hernia     Past Surgical History:  Procedure Laterality Date   APPENDECTOMY     CARDIAC CATHETERIZATION  06/13/2005   [facility]  EF 55%   CHOLECYSTECTOMY     CORONARY ARTERY BYPASS GRAFT  07/18/2000   x 4   DOPPLER ECHOCARDIOGRAPHY  02/21/2012   EF > 55%   DOPPLER ECHOCARDIOGRAPHY  03/16/2010   EF 45-50%   INSERTION OF MESH N/A 01/04/2017   Procedure: INSERTION OF MESH;  Surgeon: Jackolyn Confer, MD;  Location: [facility];  Service: General;  Laterality: N/A;   NM MYOVIEW LTD  03/16/2010   EF 64%  Low risk study   right shoulder arthroscopy  8144   UMBILICAL HERNIA REPAIR N/A 01/04/2017   Procedure: REPAIR CHRONICALLY INCARCERATED UMBILICAL HERNIA;  Surgeon: Jackolyn Confer, MD;  Location: [facility];  Service: General;  Laterality: N/A;    Current Medications: Current Meds  Medication Sig   alfuzosin (UROXATRAL) 10 MG 24 hr tablet Take 10 mg by mouth daily.   apixaban (ELIQUIS) 5 MG TABS tablet Take by mouth.   atorvastatin (LIPITOR) 40 MG tablet TAKE 1 TABLET (40 MG TOTAL) BY MOUTH DAILY. OV NEEDED   B Complex-C (B-COMPLEX WITH VITAMIN C) tablet Take 1 tablet by mouth daily.   ezetimibe (ZETIA) 10 MG tablet TAKE 1 TABLET BY MOUTH EVERY DAY   fenofibrate 160 MG tablet TAKE 1 TABLET (160 MG TOTAL) BY MOUTH DAILY. *OFFICE VISIT NEEDED*   hydrocortisone 1 %  ointment Apply 1 application topically as needed for itching.   nystatin (MYCOSTATIN/NYSTOP) powder Apply 1 g topically daily as needed (itching).     Allergies:   Doxycycline   Social History   Socioeconomic History   Marital status: Divorced    Spouse name: Not on file   Number of children: Not on file   Years of education: Not on file   Highest education level: Not on file  Occupational History   Not on file  Tobacco Use   Smoking status: Never   Smokeless tobacco: Never  Vaping Use   Vaping Use: Never used  Substance and Sexual Activity   Alcohol use: No   Drug use: No   Sexual activity: Not on file   Other Topics Concern   Not on file  Social History Narrative   Not on file   Social Determinants of Health   Financial Resource Strain: Not on file  Food Insecurity: Not on file  Transportation Needs: Not on file  Physical Activity: Not on file  Stress: Not on file  Social Connections: Not on file     Family History: The patient's family history includes Heart disease in his father; Stroke in his father.  ROS:   Please see the history of present illness.    (+) Fatigue All other systems reviewed and are negative.  EKGs/Labs/Other Studies Reviewed:    The following studies were reviewed today:  08/30/2021  Echocardiogram:  1. Left ventricular ejection fraction, by estimation, is 65 to 70%. The  left ventricle has normal function. The left ventricle has no regional  wall motion abnormalities. Left ventricular diastolic parameters were  normal.   2. Right ventricular systolic function is normal. The right ventricular  size is normal.   3. The mitral valve is normal in structure. Trivial mitral valve  regurgitation.   4. The aortic valve is normal in structure. Aortic valve regurgitation is not visualized.   5. The inferior vena cava is normal in size with greater than 50%  respiratory variability, suggesting right atrial pressure of 3 mmHg.   Comparison(s): The left ventricular function is unchanged.   01/12/2021  Bilateral Carotid Doppler: Summary:  Right Carotid: There is no evidence of stenosis in the right ICA. There  was no                 evidence of thrombus, dissection, atherosclerotic plaque or                 stenosis in the cervical carotid system.   Left Carotid: Velocities in the left ICA are consistent with a 1-39%  stenosis.   Vertebrals:  Bilateral vertebral arteries demonstrate antegrade flow.  Subclavians: Normal flow hemodynamics were seen in bilateral subclavian               arteries.   08/04/2021  Cath/PE Thrombectomy ([place]): PROCEDURES:    1.  Ultrasound-guided central venous access to the right common femoral  artery  2.  Right heart catheterization  3.  Pulmonary angiogram, selective right and left  4.  Pulmonary thromboembolectomy, bilateral.  5.  Insertion of a left radial artery pressure monitoring line.  6.  Removal of sheath in right common femoral artery.   DIAGNOSTIC FINDINGS:    1.  Initial pulmonary artery pressure 37/15 mmHg (mean 24 mmHg).  2.  Mixed venous oxygen saturation was 71%.   INTERVENTIONS:  1.  Successful mechanical thromboembolectomy of the right pulmonary  artery.  A large volume of thrombus was removed.  2.  Successful mechanical thromboembolectomy of the left pulmonary artery.   A small volume of thrombus was removed.  3.  The completion pulmonary artery pressure was 29/12 mmHg (mean 18  mmHg).    EKG:   EKG is personally reviewed.  01/06/2022:  EKG was not ordered.    Recent Labs: No results found for requested labs within last 365 days.   Recent Lipid Panel    Component Value Date/Time   CHOL 130 02/19/2019 0905   TRIG 133 02/19/2019 0905   HDL 39 (L) 02/19/2019 0905   CHOLHDL 3.3 02/19/2019 0905   CHOLHDL 3.8 05/07/2018 1255   VLDL 24 03/29/2016 1023   LDLCALC 64 02/19/2019 0905   LDLCALC 103 (H) 05/07/2018 1255    Physical Exam:    VS:  BP 104/68   Ht '5\' 10"'$  (1.778 m)   Wt 200 lb (90.7 kg)   BMI 28.70 kg/m     Wt Readings from Last 3 Encounters:  01/06/22 200 lb (90.7 kg)  11/17/21 199 lb 6.4 oz (90.4 kg)  08/18/21 187 lb (84.8 kg)     GEN: Well nourished, well developed in no acute distress HEENT: Normal NECK: No JVD; No carotid bruits LYMPHATICS: No lymphadenopathy CARDIAC: RRR, no murmurs, rubs, gallops RESPIRATORY:  Clear to auscultation without rales, wheezing or rhonchi  ABDOMEN: Soft, non-tender, non-distended MUSCULOSKELETAL:  No edema; No deformity  SKIN: Warm and dry NEUROLOGIC:  Alert and oriented x 3 PSYCHIATRIC:  Normal  affect       ASSESSMENT:    1. Essential hypertension   2. Coronary artery disease involving native coronary artery of native heart without angina pectoris   3. S/P CABG x 4    PLAN:    In order of problems listed above:  #Atrial flutter In the setting of DVT and PE.  He has not had a recurrence thankfully.  He continues to take Eliquis but would like to avoid long-term use if at all possible.  Given that his DVT and PE were provoked after surgery I do think it is reasonable for him to come off blood thinner as long as we are confident that he is not having recurrent arrhythmia.  I discussed using a loop recorder for ongoing heart rhythm surveillance.  He would like to proceed.  I discussed the loop recorder procedure in detail including the risks and the monthly monitoring costs and he wishes to proceed.  I would plan on implanting the loop recorder today and having him follow-up in 6 months.  If he has not had any arrhythmia in the 6 months of loop recorder monitoring could then stop the Eliquis with continued surveillance for recurrent atrial fibrillation or flutter.  Total time spent with patient today 60 minutes. This includes reviewing records, evaluating the patient and coordinating care.  Medication Adjustments/Labs and Tests Ordered: Current medicines are reviewed at length with the patient today.  Concerns regarding medicines are outlined above.  No orders of the defined types were placed in this encounter.  No orders of the defined types were placed in this encounter.   I,Mathew Stumpf,acting as a Education administrator for Vickie Epley, MD.,have documented all relevant documentation on the behalf of Vickie Epley, MD,as directed by  Vickie Epley, MD while in the presence of Vickie Epley, MD.  I, Vickie Epley, MD, have reviewed all documentation for this visit. The documentation on 01/06/22 for the exam, diagnosis, procedures,  and orders are all accurate and  complete.   Signed, Hilton Cork. Quentin Ore, MD, [facility], [facility] 01/06/2022 9:18 PM    Electrophysiology [facility]   ---------------  SURGEON:  Lars Mage, MD    PREPROCEDURE DIAGNOSIS:  Atrial flutter    POSTPROCEDURE DIAGNOSIS:  Atrial flutter     PROCEDURES:   1. Implantable loop recorder implantation    INTRODUCTION:  TAVISH GETTIS is a 76 y.o. male with a history of atrial flutter in the setting of a DVT and PE presents today for implantable loop recorder placement.  The monitoring costs associated with loop recorder monitoring have been discussed with the patient.    DESCRIPTION OF PROCEDURE:  Informed written consent was obtained.  The patient required no sedation for the procedure today.  Mapping over the patient's chest was performed to identify the area where electrograms were most prominent for ILR recording.  This area was found to be the left parasternal region over the 3rd-4th intercostal space. The patients left chest was therefore prepped and draped in the usual sterile fashion. The skin overlying the left parasternal region was infiltrated with lidocaine for local analgesia.  A 0.5-cm incision was made over the left parasternal region over the 3rd intercostal space.  A subcutaneous ILR pocket was fashioned using a combination of sharp and blunt dissection.  An Abbott Assert-IQ EL+ implantable loop recorder (serial [id number]) was then placed into the pocket  R waves were very prominent and measured >0.30m.  Steri- Strips and a sterile dressing were then applied.  There were no early apparent complications.     CONCLUSIONS:   1. Successful implantation of an Abbott Assert-IQ EL+ implantable loop recorder for atrial flutter.    2. No early apparent complications.    CLysbeth GalasT. LQuentin Ore MD, FMclaren [facility] FPacific Eye InstituteCardiac Electrophysiology

## 2022-01-06 NOTE — Patient Instructions (Signed)
Medication Instructions:  Your physician recommends that you continue on your current medications as directed. Please refer to the Current Medication list given to you today.  Labwork: None ordered.  Testing/Procedures: None ordered.  Follow-Up:  Your physician wants you to follow-up in: 6 month with Dr. Quentin Ore.     Implantable Loop Recorder Placement, Care After This sheet gives you information about how to care for yourself after your procedure. Your health care provider may also give you more specific instructions. If you have problems or questions, contact your health care provider. What can I expect after the procedure? After the procedure, it is common to have: Soreness or discomfort near the incision. Some swelling or bruising near the incision.  Follow these instructions at home: Incision care  Monitor your cardiac device site for redness, swelling, and drainage. Call the device clinic at [phone] if you experience these symptoms or fever/chills.  Keep the large square bandage on your site for 24 hours and then you may remove it yourself. Keep the steri-strips underneath in place.   You may shower after 72 hours / 3 days from your procedure with the steri-strips in place. They will usually fall off on their own, or may be removed after 10 days. Pat dry.   Avoid lotions, ointments, or perfumes over your incision until it is well-healed.  Please do not submerge in water until your site is completely healed.   Your device is MRI compatible.   Remote monitoring is used to monitor your cardiac device from home. This monitoring is scheduled every month by our office. It allows [place] to keep an eye on the function of your device to ensure it is working properly.  If your wound site starts to bleed apply pressure.      If you have any questions/concerns please call the device clinic at [phone].  Activity  Return to your normal activities.  General  instructions Follow instructions from your health care provider about how to manage your implantable loop recorder and transmit the information. Learn how to activate a recording if this is necessary for your type of device. You may go through a metal detection gate, and you may let someone hold a metal detector over your chest. Show your ID card if needed. Do not have an MRI unless you check with your health care provider first. Take over-the-counter and prescription medicines only as told by your health care provider. Keep all follow-up visits as told by your health care provider. This is important. Contact a health care provider if: You have redness, swelling, or pain around your incision. You have a fever. You have pain that is not relieved by your pain medicine. You have triggered your device because of fainting (syncope) or because of a heartbeat that feels like it is racing, slow, fluttering, or skipping (palpitations). Get help right away if you have: Chest pain. Difficulty breathing. Summary After the procedure, it is common to have soreness or discomfort near the incision. Change your dressing as told by your health care provider. Follow instructions from your health care provider about how to manage your implantable loop recorder and transmit the information. Keep all follow-up visits as told by your health care provider. This is important. This information is not intended to replace advice given to you by your health care provider. Make sure you discuss any questions you have with your health care provider. Document Released: 06/08/2015 Document Revised: 08/12/2017 Document Reviewed: 08/12/2017 Elsevier Patient Education  2020 [employer].

## 2022-02-07 ENCOUNTER — Ambulatory Visit (INDEPENDENT_AMBULATORY_CARE_PROVIDER_SITE_OTHER): Payer: Medicare HMO

## 2022-02-07 DIAGNOSIS — I4892 Unspecified atrial flutter: Secondary | ICD-10-CM

## 2022-02-08 LAB — CUP PACEART REMOTE DEVICE CHECK
Date Time Interrogation Session: 20230731020049
Implantable Pulse Generator Implant Date: 20230629
Pulse Gen Serial Number: 511010278

## 2022-02-22 DIAGNOSIS — H903 Sensorineural hearing loss, bilateral: Secondary | ICD-10-CM | POA: Insufficient documentation

## 2022-02-25 ENCOUNTER — Encounter (HOSPITAL_COMMUNITY): Payer: Self-pay

## 2022-02-25 ENCOUNTER — Emergency Department (HOSPITAL_COMMUNITY)
Admission: EM | Admit: 2022-02-25 | Discharge: 2022-02-25 | Disposition: A | Discharge status: Home / Self Care | Payer: Medicare HMO | Attending: Emergency Medicine | Admitting: Emergency Medicine

## 2022-02-25 ENCOUNTER — Other Ambulatory Visit: Payer: Self-pay

## 2022-02-25 ENCOUNTER — Emergency Department (HOSPITAL_COMMUNITY): Payer: Medicare HMO

## 2022-02-25 DIAGNOSIS — Z7901 Long term (current) use of anticoagulants: Secondary | ICD-10-CM | POA: Diagnosis not present

## 2022-02-25 DIAGNOSIS — D649 Anemia, unspecified: Secondary | ICD-10-CM | POA: Diagnosis not present

## 2022-02-25 DIAGNOSIS — R55 Syncope and collapse: Secondary | ICD-10-CM | POA: Diagnosis not present

## 2022-02-25 DIAGNOSIS — R531 Weakness: Secondary | ICD-10-CM | POA: Diagnosis present

## 2022-02-25 HISTORY — DX: Unspecified atrial fibrillation: I48.91

## 2022-02-25 HISTORY — DX: Saddle embolus of pulmonary artery without acute cor pulmonale: I26.92

## 2022-02-25 HISTORY — DX: Acute myocardial infarction, unspecified: I21.9

## 2022-02-25 LAB — URINALYSIS, ROUTINE W REFLEX MICROSCOPIC
Bilirubin Urine: NEGATIVE
Glucose, UA: NEGATIVE mg/dL
Hgb urine dipstick: NEGATIVE
Ketones, ur: NEGATIVE mg/dL
Leukocytes,Ua: NEGATIVE
Nitrite: NEGATIVE
Protein, ur: NEGATIVE mg/dL
Specific Gravity, Urine: 1.015 (ref 1.005–1.030)
pH: 7 (ref 5.0–8.0)

## 2022-02-25 LAB — I-STAT CHEM 8, ED
BUN: 14 mg/dL (ref 8–23)
Calcium, Ion: 1.12 mmol/L — ABNORMAL LOW (ref 1.15–1.40)
Chloride: 108 mmol/L (ref 98–111)
Creatinine, Ser: 1 mg/dL (ref 0.61–1.24)
Glucose, Bld: 170 mg/dL — ABNORMAL HIGH (ref 70–99)
HCT: 35 % — ABNORMAL LOW (ref 39.0–52.0)
Hemoglobin: 11.9 g/dL — ABNORMAL LOW (ref 13.0–17.0)
Potassium: 4 mmol/L (ref 3.5–5.1)
Sodium: 141 mmol/L (ref 135–145)
TCO2: 19 mmol/L — ABNORMAL LOW (ref 22–32)

## 2022-02-25 LAB — COMPREHENSIVE METABOLIC PANEL
ALT: 28 U/L (ref 0–44)
AST: 26 U/L (ref 15–41)
Albumin: 3.6 g/dL (ref 3.5–5.0)
Alkaline Phosphatase: 36 U/L — ABNORMAL LOW (ref 38–126)
Anion gap: 6 (ref 5–15)
BUN: 13 mg/dL (ref 8–23)
CO2: 21 mmol/L — ABNORMAL LOW (ref 22–32)
Calcium: 8.5 mg/dL — ABNORMAL LOW (ref 8.9–10.3)
Chloride: 110 mmol/L (ref 98–111)
Creatinine, Ser: 1.14 mg/dL (ref 0.61–1.24)
GFR, Estimated: >60 mL/min (ref >60)
Glucose, Bld: 177 mg/dL — ABNORMAL HIGH (ref 70–99)
Potassium: 4 mmol/L (ref 3.5–5.1)
Sodium: 137 mmol/L (ref 135–145)
Total Bilirubin: 0.7 mg/dL (ref 0.3–1.2)
Total Protein: 5.8 g/dL — ABNORMAL LOW (ref 6.5–8.1)

## 2022-02-25 LAB — CBC WITH DIFFERENTIAL/PLATELET
Abs Immature Granulocytes: 0.01 10*3/uL (ref 0.00–0.07)
Basophils Absolute: 0 10*3/uL (ref 0.0–0.1)
Basophils Relative: 1 %
Eosinophils Absolute: 0.2 10*3/uL (ref 0.0–0.5)
Eosinophils Relative: 3 %
HCT: 36 % — ABNORMAL LOW (ref 39.0–52.0)
Hemoglobin: 12.6 g/dL — ABNORMAL LOW (ref 13.0–17.0)
Immature Granulocytes: 0 %
Lymphocytes Relative: 22 %
Lymphs Abs: 1 10*3/uL (ref 0.7–4.0)
MCH: 32.9 pg (ref 26.0–34.0)
MCHC: 35 g/dL (ref 30.0–36.0)
MCV: 94 fL (ref 80.0–100.0)
Monocytes Absolute: 0.4 10*3/uL (ref 0.1–1.0)
Monocytes Relative: 9 %
Neutro Abs: 3.1 10*3/uL (ref 1.7–7.7)
Neutrophils Relative %: 65 %
Platelets: 228 10*3/uL (ref 150–400)
RBC: 3.83 MIL/uL — ABNORMAL LOW (ref 4.22–5.81)
RDW: 13.1 % (ref 11.5–15.5)
WBC: 4.7 10*3/uL (ref 4.0–10.5)
nRBC: 0 % (ref 0.0–0.2)

## 2022-02-25 LAB — TROPONIN I (HIGH SENSITIVITY)
Troponin I (High Sensitivity): 3 ng/L (ref <18)
Troponin I (High Sensitivity): 4 ng/L (ref <18)

## 2022-02-25 MED ORDER — IOHEXOL 350 MG/ML SOLN
75.0000 mL | Freq: Once | INTRAVENOUS | Status: AC | PRN
  Administered 2022-02-25: 75 mL via INTRAVENOUS

## 2022-02-25 MED ORDER — LACTATED RINGERS IV BOLUS
1000.0000 mL | Freq: Once | INTRAVENOUS | Status: AC
  Administered 2022-02-25: 1000 mL via INTRAVENOUS

## 2022-02-25 NOTE — ED Provider Notes (Signed)
[facility] EMERGENCY DEPARTMENT Provider Note   CSN: [id number] Arrival date & time: 02/25/22  8657     History  Chief Complaint  Patient presents with   Weakness    Keith Phelps is a 76 y.o. male.  HPI 76 year old male presents with acute generalized weakness.  He was in the shower and all of a sudden felt a funny sensation in his head and felt like he was a little lightheaded but not quite about to pass out.  He finished the shower but then had to sit down.  He was unable to get up and walk due to acute weakness in his lower extremities.  He states he felt like he could not get a full breath though was not really short of breath and was not having any chest pain.  He has not felt ill recently such as vomiting or diarrhea.  He has not noticed any blood in his stool.  No abdominal pain or headache.  No current neck or back pain.  Has chronic left-sided weakness from prior spine issues though this is improving.  Today he feels generally weak however.  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   His biggest concern is for a recurrent blood clot in the lungs.  Home Medications Prior to Admission medications   Medication Sig Start Date End Date Taking? Authorizing Provider  alfuzosin (UROXATRAL) 10 MG 24 hr tablet Take 10 mg by mouth daily. 12/25/20   [provider]  apixaban (ELIQUIS) 5 MG TABS tablet Take by mouth. 08/13/21   [provider]  atorvastatin (LIPITOR) 40 MG tablet TAKE 1 TABLET (40 MG TOTAL) BY MOUTH DAILY. OV NEEDED 04/14/21   Hilty, Nadean Corwin, MD  B Complex-C (B-COMPLEX WITH VITAMIN C) tablet Take 1 tablet by mouth daily. 08/18/00   [provider]  ezetimibe (ZETIA) 10 MG tablet TAKE 1 TABLET BY MOUTH EVERY DAY 07/08/21   Croitoru, Mihai, MD  fenofibrate 160 MG tablet TAKE 1  TABLET (160 MG TOTAL) BY MOUTH DAILY. *OFFICE VISIT NEEDED* 08/16/21   Pixie Casino, MD  hydrocortisone 1 % ointment Apply 1 application topically as needed for itching.    [provider]  nystatin (MYCOSTATIN/NYSTOP) powder Apply 1 g topically daily as needed (itching).    [provider]      Allergies    Doxycycline    Review of Systems   Review of Systems  Constitutional:  Positive for fatigue. Negative for fever.  Respiratory:  Positive for shortness of breath.   Cardiovascular:  Negative for chest pain.  Gastrointestinal:  Negative for abdominal pain, diarrhea and vomiting.  Musculoskeletal:  Negative for back pain and neck pain.  Neurological:  Positive for weakness and light-headedness. Negative for syncope and headaches.    Physical Exam Updated Vital Signs BP 131/61   Pulse (!) 59   Temp 97.8 F (36.6 C) (Oral)   Resp 15   Ht '5\' 10"'$  (1.778 m)   Wt 90.7 kg   SpO2 97%   BMI 28.69 kg/m  Physical Exam Vitals and nursing note reviewed.  Constitutional:      General: He is not in acute distress.    Appearance: He is well-developed. He is not ill-appearing or diaphoretic.  HENT:     Head: Normocephalic and atraumatic.  Eyes:  Extraocular Movements: Extraocular movements intact.     Pupils: Pupils are equal, round, and reactive to light.  Cardiovascular:     Rate and Rhythm: Normal rate and regular rhythm.     Heart sounds: Normal heart sounds.  Pulmonary:     Effort: Pulmonary effort is normal.     Breath sounds: Normal breath sounds.  Abdominal:     Palpations: Abdomen is soft.     Tenderness: There is no abdominal tenderness.  Skin:    General: Skin is warm and dry.  Neurological:     Mental Status: He is alert.     Comments: CN 3-12 grossly intact. 5/5 strength in all 4 extremities, though there is mild appreciable weakness in the left upper and left lower extremities. Grossly normal sensation. Normal finger to nose.      ED  Results / Procedures / Treatments   Labs (all labs ordered are listed, but only abnormal results are displayed) Labs Reviewed  COMPREHENSIVE METABOLIC PANEL - Abnormal; Notable for the following components:      Result Value   CO2 21 (*)    Glucose, Bld 177 (*)    Calcium 8.5 (*)    Total Protein 5.8 (*)    Alkaline Phosphatase 36 (*)    All other components within normal limits  CBC WITH DIFFERENTIAL/PLATELET - Abnormal; Notable for the following components:   RBC 3.83 (*)    Hemoglobin 12.6 (*)    HCT 36.0 (*)    All other components within normal limits  URINALYSIS, ROUTINE W REFLEX MICROSCOPIC - Abnormal; Notable for the following components:   APPearance HAZY (*)    All other components within normal limits  I-STAT CHEM 8, ED - Abnormal; Notable for the following components:   Glucose, Bld 170 (*)    Calcium, Ion 1.12 (*)    TCO2 19 (*)    Hemoglobin 11.9 (*)    HCT 35.0 (*)    All other components within normal limits  TROPONIN I (HIGH SENSITIVITY)  TROPONIN I (HIGH SENSITIVITY)    EKG EKG Interpretation  Date/Time:  Friday February 25 2022 09:09:06 EDT Ventricular Rate:  65 PR Interval:    QRS Duration: 88 QT Interval:  417 QTC Calculation: 434 R Axis:   -4 Text Interpretation: Normal sinus rhythm Ventricular premature complex no acute ST/T changes No old tracing to compare Confirmed by Sherwood Gambler [phone]) on 02/25/2022 9:24:05 AM  Radiology CT Angio Chest PE W and/or Wo Contrast  Result Date: 02/25/2022 CLINICAL DATA:  High probability of pulmonary embolism. EXAM: CT ANGIOGRAPHY CHEST WITH CONTRAST TECHNIQUE: Multidetector CT imaging of the chest was performed using the standard protocol during bolus administration of intravenous contrast. Multiplanar CT image reconstructions and MIPs were obtained to evaluate the vascular anatomy. RADIATION DOSE REDUCTION: This exam was performed according to the departmental dose-optimization program which includes automated  exposure control, adjustment of the mA and/or kV according to patient size and/or use of iterative reconstruction technique. CONTRAST:  2m OMNIPAQUE IOHEXOL 350 MG/ML SOLN COMPARISON:  August 04, 2021. FINDINGS: Cardiovascular: Satisfactory opacification of the pulmonary arteries to the segmental level. No evidence of pulmonary embolism. Normal heart size. No pericardial effusion. Status post coronary bypass graft. Mediastinum/Nodes: No enlarged mediastinal, hilar, or axillary lymph nodes. Thyroid gland, trachea, and esophagus demonstrate no significant findings. Lungs/Pleura: Lungs are clear. No pleural effusion or pneumothorax. Upper Abdomen: No acute abnormality. Musculoskeletal: No chest wall abnormality. No acute or significant osseous findings. Review of the MIP  images confirms the above findings. IMPRESSION: No definite evidence of pulmonary embolus. No acute abnormality seen in the chest. Electronically Signed   By: Marijo Conception M.D.   On: 02/25/2022 12:34   DG Chest Portable 1 View  Result Date: 02/25/2022 CLINICAL DATA:  Weakness, lightheadedness EXAM: PORTABLE CHEST 1 VIEW COMPARISON:  08/04/2021 FINDINGS: Single frontal view of the chest demonstrates loop recorder overlying the left chest. Postsurgical changes from CABG. Cardiac silhouette is unremarkable. No airspace disease, effusion, or pneumothorax. Postsurgical changes from ACDF at the cervicothoracic junction. No acute bony abnormality. IMPRESSION: 1. No acute intrathoracic process. Electronically Signed   By: Randa Ngo M.D.   On: 02/25/2022 10:01    Procedures Procedures    Medications Ordered in ED Medications  lactated ringers bolus 1,000 mL (0 mLs Intravenous Stopped 02/25/22 1507)  iohexol (OMNIPAQUE) 350 MG/ML injection 75 mL (75 mLs Intravenous Contrast Given 02/25/22 1223)    ED Course/ Medical Decision Making/ A&P                           Medical Decision Making Amount and/or Complexity of Data  Reviewed External Data Reviewed: labs and radiology. Labs: ordered.    Details: Troponins negative x2.  Mild anemia is similar to Care Everywhere Radiology: ordered and independent interpretation performed.    Details: No CHF/pneumonia.  No PE on CTA. ECG/medicine tests: independent interpretation performed.    Details: No acute ischemia.  Risk Prescription drug management.   Patient presents with generalized weakness and what sounds like may be near syncope.  He has a loop recorder but we are unable to access this.  Otherwise, he is well-appearing here with no focal neurodeficits.  Work-up is pretty unremarkable.  Given his high concern for PE, CTA was obtained but is unremarkable.  He is feeling better after time and some fluids.  He is able to ambulate.  He feels well enough for discharge.  Unclear what caused this transient generalized weakness but I doubt stroke, spinal cord emergency, etc.  Will discharge home with return precautions.        Final Clinical Impression(s) / ED Diagnoses Final diagnoses:  Generalized weakness  Near syncope    Rx / DC Orders ED Discharge Orders          Ordered    Ambulatory referral to Cardiology       Comments: If you have not heard from the Cardiology office within the next 72 hours please call [phone].   02/25/22 1434              Sherwood Gambler, MD 02/25/22 1553

## 2022-02-25 NOTE — ED Triage Notes (Signed)
Was taking a shower and getting dressed for the gym when he became very weak and states he almost had a syncopal episode

## 2022-02-25 NOTE — ED Triage Notes (Signed)
Alert oriented

## 2022-02-25 NOTE — Discharge Instructions (Addendum)
If you develop new or worsening weakness, lightheadedness or passing out, chest pain, shortness of breath, or any other new/concerning symptoms then return to the ER for evaluation.

## 2022-03-01 ENCOUNTER — Telehealth: Payer: Self-pay | Admitting: Cardiology

## 2022-03-01 NOTE — Telephone Encounter (Signed)
Checked remote transmission, no episodes triggered during time frame. Advised patient to f/u with PCP. Pt voiced understanding.

## 2022-03-01 NOTE — Telephone Encounter (Signed)
Patient states he had an episode of weakness and flushing 8/18 from 7 am - about 9:30 am and would like to know if his device showed anything. He says his phone has a confidential voicemail if a message needs to be left.

## 2022-03-13 NOTE — Progress Notes (Addendum)
Merlin Loop Recorder  

## 2022-04-01 ENCOUNTER — Other Ambulatory Visit: Payer: Self-pay

## 2022-04-01 MED ORDER — ATORVASTATIN CALCIUM 40 MG PO TABS: 40.0000 mg | ORAL_TABLET | Freq: Every day | ORAL | 2 refills | Status: DC

## 2022-05-09 ENCOUNTER — Ambulatory Visit (INDEPENDENT_AMBULATORY_CARE_PROVIDER_SITE_OTHER): Payer: Medicare HMO

## 2022-05-09 DIAGNOSIS — I4892 Unspecified atrial flutter: Secondary | ICD-10-CM | POA: Diagnosis not present

## 2022-05-10 LAB — CUP PACEART REMOTE DEVICE CHECK
Date Time Interrogation Session: 20231030190807
Implantable Pulse Generator Implant Date: 20230629
Pulse Gen Serial Number: 511010278

## 2022-05-23 ENCOUNTER — Encounter: Payer: Self-pay | Admitting: Internal Medicine

## 2022-05-23 ENCOUNTER — Ambulatory Visit: Payer: Medicare HMO | Attending: Internal Medicine | Admitting: Internal Medicine

## 2022-05-23 VITALS — BP 114/72 | HR 82 | Ht 70.0 in | Wt 204.2 lb

## 2022-05-23 DIAGNOSIS — I4892 Unspecified atrial flutter: Secondary | ICD-10-CM | POA: Diagnosis not present

## 2022-05-23 DIAGNOSIS — E785 Hyperlipidemia, unspecified: Secondary | ICD-10-CM | POA: Diagnosis not present

## 2022-05-23 DIAGNOSIS — Z951 Presence of aortocoronary bypass graft: Secondary | ICD-10-CM | POA: Diagnosis not present

## 2022-05-23 DIAGNOSIS — I10 Essential (primary) hypertension: Secondary | ICD-10-CM | POA: Diagnosis not present

## 2022-05-23 NOTE — Patient Instructions (Signed)
Medication Instructions:  The current medical regimen is effective;  continue present plan and medications.  *If you need a refill on your cardiac medications before your next appointment, please call your pharmacy*   Follow-Up: At [facility], you and your health needs are our priority.  As part of our continuing mission to provide you with exceptional heart care, we have created designated Provider Care Teams.  These Care Teams include your primary Cardiologist (physician) and Advanced Practice Providers (APPs -  Physician Assistants and Nurse Practitioners) who all work together to provide you with the care you need, when you need it.  We recommend signing up for the patient portal called "MyChart".  Sign up information is provided on this After Visit Summary.  MyChart is used to connect with patients for Virtual Visits (Telemedicine).  Patients are able to view lab/test results, encounter notes, upcoming appointments, etc.  Non-urgent messages can be sent to your provider as well.   To learn more about what you can do with MyChart, go to [email or web address].    Your next appointment:   6 month(s)  The format for your next appointment:   In Person  Provider:   Pixie Casino, MD

## 2022-05-23 NOTE — Progress Notes (Signed)
OFFICE NOTE  Chief Complaint:  Follow-up  Primary Care Physician: Rikki Spearing, NP  HPI:  Keith Phelps is a 76 year old gentleman with history of coronary disease and CABG in 2002, LIMA to the LAD, SVG to RCA and SVG to ramus and an SVG to the OM. His angiogram in 2006 showed patent vein grafts. He has done well without any symptoms. He is able to exercise without any chest pain or worsening shortness of breath. 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 He also has dyslipidemia and hypertension.  He was recently diagnosed with borderline diabetes now diet controlled. He has made major changes in his diet and exercise to combat this. He reports over the last year he said problems with his shoulders and had arthroscopic surgery on the right shoulder. He been undergoing rehabilitation on and off. He has recently been missing some doses of his TriCor. We recently obtained a lipid profile that showed his total cholesterol 154, triglycerides 147, HDL 40 and LDL 85.  I saw Keith Phelps back in the office today. He is currently without complaints. Unfortunately he's gained some weight and stopped doing so his exercise. He denies any chest pain or significant worsening shortness of breath. His last nuclear stress test was in 2011 and his bypass was in 2002. He's had good cholesterol control and recent laboratory work continues to show his cholesterol is at goal. Unfortunately his A1c is borderline elevated at 5.9 with a fasting glucose of 123. The rest of his laboratory work is within normal limits.  Keith Phelps returns today for follow-up. Overall he is doing well, however he has gained some additional weight. Today he is 205 pounds. Most of this is abdominal weight. He reports less exercise. 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 He does some short sprint type walking but has significantly decreased his exercise. His  last cholesterol testing was well controlled in August however A1c continues to creep up. He is not currently on any oral antidiabetic medications. Blood pressures controlled today. He denies chest pain or shortness of breath.  11/25/2016  Keith Phelps returns today for follow-up. Overall is done well the past year. Unfortunate is continued to gain some weight. He continues to play a lot of gaming and spends time on his box. He's not been very active. He has worsening abdominal girth and is actually noted to have a ventral hernia. He's apparently scheduled to see surgery for evaluation and possible repair. Hemoglobin A1c seems to have around 5.9. His cholesterol is still mildly elevated but fairly well-controlled. He denies any chest pain or worsening shortness of breath.  11/27/2017  Keith Phelps is seen today for follow-up.  This is an annual visit.  He seems to be doing well.  He is in the process of moving to a retirement community.  He is less active than he used to be.  He had a ventral hernia which was repaired.  He denies any chest pain or shortness of breath.  He is overdue for a lipid profile.  EKG today shows sinus bradycardia at 57.  He is on atorvastatin and fenofibrate.   02/25/2019  Keith Phelps returns today for follow-up.  He is continued to do really well.  He lives at [facility] and says that he enjoys his lifestyle there.  He used to be able to go to the gym however now  just walks outside.  After adding ezetimibe to his labs please report of marked improvement in his lipid profile. Labs from a week ago showed total cholesterol 130, triglycerides 133, HDL 39 and LDL 64.  02/27/2019  Keith Phelps is seen today in follow-up.  Overall he continues to do well.  He has had significant improvement in his lipids after adding the ezetimibe.  Most recent labs show total cholesterol 111, triglycerides 111, HDL 40 and LDL 49.  He denies any chest pain or shortness of breath.  He remains physically  active.  02/23/2021  Keith Phelps is seen today in follow-up.  He reports recently he has had some neuromuscular symptoms.  He noted some jerking motion in his left arm.  Ultimately underwent some physical therapy for this but did not have a neurologic evaluation.  He is also had some unusual symptoms in both the right arm and both legs.  He is scheduled to see a neurologist for this but cannot get appointment until September.  He denies any chest pain or shortness of breath.  His lab work seems to be very well controlled.  Particularly his lipid profile showed total cholesterol 110, triglycerides 63, HDL 42 and LDL 55.  A1c is 6.5%.  A1c is 6.5%.  His metabolic profile is essentially normal except for elevated glucose.  05/23/2022  Keith Phelps returns today for follow-up.  He has had a significantly eventful year.  He had neck surgery and subsequently developed a saddle pulmonary embolus.  He nearly died from this and had some degree of strain of the heart including an NSTEMI, however his echo performed in February showed normal biventricular function.  Overall he seems to have recovered from this although it was a slow recovery.  He did have some atrial fibrillation which was new and thought to be related to his PE.  As he was scheduled only to have 6 months of anticoagulation, there was a plan device to place a loop recorder to see if he was having any more atrial fibrillation.  That was successfully placed by Dr. Quentin Ore and he has follow-up with Keith Phelps in December.  It will be decided at that time as to whether or not he needs to continue on long-term anticoagulation.   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[place])    Sleep apnea    Umbilical hernia     Past Surgical  History:  Procedure Laterality Date   APPENDECTOMY     CARDIAC CATHETERIZATION  06/13/2005   [place]  EF 55%   CHOLECYSTECTOMY     CORONARY ARTERY BYPASS GRAFT  07/18/2000   x 4   DOPPLER ECHOCARDIOGRAPHY  02/21/2012   EF > 55%   DOPPLER ECHOCARDIOGRAPHY  03/16/2010   EF 45-50%   INSERTION OF MESH N/A 01/04/2017   Procedure: INSERTION OF MESH;  Surgeon: Jackolyn Confer, MD;  Location: [facility];  Service: General;  Laterality: N/A;   NM MYOVIEW LTD  03/16/2010   EF 64%  Low risk study   right shoulder arthroscopy  9030   UMBILICAL HERNIA REPAIR N/A 01/04/2017   Procedure: REPAIR CHRONICALLY INCARCERATED UMBILICAL HERNIA;  Surgeon: Jackolyn Confer, MD;  Location: [facility];  Service: General;  Laterality:  N/A;    FAMHx:  Family History  Problem Relation Age of Onset   Stroke Father    Heart disease Father     SOCHx:   reports that he has never smoked. He has never used smokeless tobacco. He reports that he does not drink alcohol and does not use drugs.  ALLERGIES:  Allergies  Allergen Reactions   Doxycycline Itching, Rash and Other (See Comments)    fever    ROS: Pertinent items noted in HPI and remainder of comprehensive ROS otherwise negative.  HOME MEDS: Current Outpatient Medications  Medication Sig Dispense Refill   alfuzosin (UROXATRAL) 10 MG 24 hr tablet Take 10 mg by mouth daily.     apixaban (ELIQUIS) 5 MG TABS tablet Take by mouth.     atorvastatin (LIPITOR) 40 MG tablet Take 1 tablet (40 mg total) by mouth daily. OV NEEDED 90 tablet 2   B Complex-C (B-COMPLEX WITH VITAMIN C) tablet Take 1 tablet by mouth daily.     ezetimibe (ZETIA) 10 MG tablet TAKE 1 TABLET BY MOUTH EVERY DAY 90 tablet 3   fenofibrate 160 MG tablet TAKE 1 TABLET (160 MG TOTAL) BY MOUTH DAILY. *OFFICE VISIT NEEDED* 90 tablet 3   hydrocortisone 1 % ointment Apply 1 application topically as needed for itching.     nystatin (MYCOSTATIN/NYSTOP) powder Apply 1 g topically daily as needed  (itching).     No current facility-administered medications for this visit.    LABS/IMAGING: No results found for this or any previous visit (from the past 48 hour(s)). No results found.  VITALS: BP 114/72 (BP Location: Left Arm, Patient Position: Sitting)   Pulse 82   Ht '5\' 10"'$  (1.778 m)   Wt 204 lb 3.2 oz (92.6 kg)   SpO2 97%   BMI 29.30 kg/m   EXAM: General appearance: alert, no distress and mildly obese Neck: no carotid bruit and no JVD Lungs: clear to auscultation bilaterally Heart: regular rate and rhythm, S1, S2 normal, no murmur, click, rub or gallop Abdomen: soft, non-tender; bowel sounds normal; no masses,  no organomegaly Extremities: extremities normal, atraumatic, no cyanosis or edema Pulses: 2+ and symmetric Skin: Skin color, texture, turgor normal. 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   Keith Phelps had paroxysmal atrial flutter in the setting of right heart strain related to pulmonary embolus which was postoperative.  Plan was for 6 months of anticoagulation and he had a loop recorder placed to see if he was having any more A-flutter.  The plan is also to consider stopping anticoagulation if there is no A-flutter after a defined duration of therapy for his PE.  Given his coronary history, however his risk of A-fib recurrence is reasonably high and I recommended that he consider keeping the loop recorder in until the battery runs out to see if he has any more recurrent A-fluter that he is unaware of off of anticoagulation.  If so then it would warrant lifelong anticoagulation.  Follow-up in 6 months or sooner as  necessary.  Pixie Casino, MD, [facility], [place] Director of the Advanced Lipid Disorders &  Cardiovascular Risk Reduction Clinic Diplomate of the American Board of Clinical Lipidology  Attending Cardiologist  Direct Dial: [phone]  Fax: [phone]  Website:  [email or web address] 05/23/2022, 10:15 AM

## 2022-05-30 ENCOUNTER — Encounter (HOSPITAL_COMMUNITY): Payer: Self-pay | Admitting: Emergency Medicine

## 2022-05-30 ENCOUNTER — Emergency Department (HOSPITAL_COMMUNITY): Payer: Medicare HMO

## 2022-05-30 ENCOUNTER — Other Ambulatory Visit: Payer: Self-pay

## 2022-05-30 ENCOUNTER — Emergency Department (HOSPITAL_COMMUNITY)
Admission: EM | Admit: 2022-05-30 | Discharge: 2022-05-30 | Disposition: A | Discharge status: Skilled Nursing Facility | Payer: Medicare HMO | Attending: Emergency Medicine | Admitting: Emergency Medicine

## 2022-05-30 DIAGNOSIS — S0990XA Unspecified injury of head, initial encounter: Secondary | ICD-10-CM | POA: Insufficient documentation

## 2022-05-30 DIAGNOSIS — S42035A Nondisplaced fracture of lateral end of left clavicle, initial encounter for closed fracture: Secondary | ICD-10-CM | POA: Insufficient documentation

## 2022-05-30 DIAGNOSIS — I2510 Atherosclerotic heart disease of native coronary artery without angina pectoris: Secondary | ICD-10-CM | POA: Insufficient documentation

## 2022-05-30 DIAGNOSIS — W19XXXA Unspecified fall, initial encounter: Secondary | ICD-10-CM | POA: Insufficient documentation

## 2022-05-30 DIAGNOSIS — I4891 Unspecified atrial fibrillation: Secondary | ICD-10-CM | POA: Insufficient documentation

## 2022-05-30 DIAGNOSIS — Z951 Presence of aortocoronary bypass graft: Secondary | ICD-10-CM | POA: Insufficient documentation

## 2022-05-30 DIAGNOSIS — Z7901 Long term (current) use of anticoagulants: Secondary | ICD-10-CM | POA: Insufficient documentation

## 2022-05-30 LAB — CBC
HCT: 39.9 % (ref 39.0–52.0)
Hemoglobin: 13.7 g/dL (ref 13.0–17.0)
MCH: 32.1 pg (ref 26.0–34.0)
MCHC: 34.3 g/dL (ref 30.0–36.0)
MCV: 93.4 fL (ref 80.0–100.0)
Platelets: 265 10*3/uL (ref 150–400)
RBC: 4.27 MIL/uL (ref 4.22–5.81)
RDW: 11.5 % (ref 11.5–15.5)
WBC: 6.6 10*3/uL (ref 4.0–10.5)
nRBC: 0 % (ref 0.0–0.2)

## 2022-05-30 LAB — COMPREHENSIVE METABOLIC PANEL
ALT: 30 U/L (ref 0–44)
AST: 28 U/L (ref 15–41)
Albumin: 4 g/dL (ref 3.5–5.0)
Alkaline Phosphatase: 44 U/L (ref 38–126)
Anion gap: 8 (ref 5–15)
BUN: 10 mg/dL (ref 8–23)
CO2: 24 mmol/L (ref 22–32)
Calcium: 9.2 mg/dL (ref 8.9–10.3)
Chloride: 108 mmol/L (ref 98–111)
Creatinine, Ser: 1.06 mg/dL (ref 0.61–1.24)
GFR, Estimated: >60 mL/min (ref >60)
Glucose, Bld: 102 mg/dL — ABNORMAL HIGH (ref 70–99)
Potassium: 4.3 mmol/L (ref 3.5–5.1)
Sodium: 140 mmol/L (ref 135–145)
Total Bilirubin: 0.6 mg/dL (ref 0.3–1.2)
Total Protein: 6.6 g/dL (ref 6.5–8.1)

## 2022-05-30 LAB — URINALYSIS, ROUTINE W REFLEX MICROSCOPIC
Bilirubin Urine: NEGATIVE
Glucose, UA: NEGATIVE mg/dL
Ketones, ur: NEGATIVE mg/dL
Leukocytes,Ua: NEGATIVE
Nitrite: NEGATIVE
Protein, ur: NEGATIVE mg/dL
RBC / HPF: >50 RBC/hpf — ABNORMAL HIGH (ref 0–5)
Specific Gravity, Urine: 1.01 (ref 1.005–1.030)
pH: 7 (ref 5.0–8.0)

## 2022-05-30 MED ORDER — ACETAMINOPHEN 325 MG PO TABS: 650.0000 mg | ORAL_TABLET | Freq: Four times a day (QID) | ORAL | 0 refills | Status: DC | PRN

## 2022-05-30 MED ORDER — OXYCODONE HCL 5 MG PO TABS: 5.0000 mg | ORAL_TABLET | Freq: Four times a day (QID) | ORAL | 0 refills | Status: DC | PRN

## 2022-05-30 MED ORDER — LIDOCAINE 5 % EX PTCH: 1.0000 | MEDICATED_PATCH | Freq: Every day | CUTANEOUS | 0 refills | Status: DC | PRN

## 2022-05-30 NOTE — ED Notes (Signed)
Patient verbalizes understanding of d/c instructions. Opportunities for questions and answers were provided. Pt d/c from ED and wheeled to lobby with wife who is taking pt back to [place] [facility].

## 2022-05-30 NOTE — ED Triage Notes (Signed)
Pt BIB EMS from [facility]. Pt was working out with a medicine ball, the ball slid out from underneath and struck head and left shoulder on metal pole. Pt has swelling to the left upper shoulder. PMS intact. Pt is on Eliquis.  EMS Vitals 120/56 HR 68 97% RA

## 2022-05-30 NOTE — ED Notes (Signed)
Patient transported to CT 

## 2022-05-30 NOTE — ED Provider Notes (Signed)
[facility] EMERGENCY DEPARTMENT Provider Note   CSN: [id number] Arrival date & time: 05/30/22  1834     History {Add pertinent medical, surgical, social history, OB history to HPI:1} Chief Complaint  Patient presents with   Keith Phelps is a 76 y.o. male.  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[place])    Sleep apnea    Umbilical hernia     Past Surgical History:  Procedure Laterality Date   APPENDECTOMY     CARDIAC CATHETERIZATION  06/13/2005   [place]  EF 55%   CHOLECYSTECTOMY     CORONARY ARTERY BYPASS GRAFT  07/18/2000   x 4   DOPPLER ECHOCARDIOGRAPHY  02/21/2012   EF > 55%   DOPPLER ECHOCARDIOGRAPHY  03/16/2010   EF 45-50%   INSERTION OF MESH N/A 01/04/2017   Procedure: INSERTION OF MESH;  Surgeon: Jackolyn Confer, MD;  Location: [facility];  Service: General;  Laterality: N/A;   NM MYOVIEW LTD  03/16/2010   EF 64%  Low risk study   right shoulder arthroscopy  3710   UMBILICAL HERNIA REPAIR N/A 01/04/2017   Procedure: REPAIR CHRONICALLY INCARCERATED UMBILICAL HERNIA;  Surgeon: Jackolyn Confer, MD;  Location: [facility];  Service: General;  Laterality: N/A;     The history is provided by the patient, the spouse and the EMS personnel. No language interpreter was used.  Fall       Home Medications Prior to Admission medications   Medication Sig Start Date End Date Taking? Authorizing Provider  alfuzosin (UROXATRAL) 10 MG 24 hr tablet Take 10 mg by mouth daily. 12/25/20   [provider]  apixaban (ELIQUIS) 5 MG TABS tablet Take by mouth. 08/13/21   [provider]  atorvastatin  (LIPITOR) 40 MG tablet Take 1 tablet (40 mg total) by mouth daily. OV NEEDED 04/01/22   Hilty, Nadean Corwin, MD  B Complex-C (B-COMPLEX WITH VITAMIN C) tablet Take 1 tablet by mouth daily. 08/18/00   [provider]  ezetimibe (ZETIA) 10 MG tablet TAKE 1 TABLET BY MOUTH EVERY DAY 07/08/21   Croitoru, Mihai, MD  fenofibrate 160 MG tablet TAKE 1 TABLET (160 MG TOTAL) BY MOUTH DAILY. *OFFICE VISIT NEEDED* 08/16/21   Pixie Casino, MD  hydrocortisone 1 % ointment Apply 1 application topically as needed for itching.    [provider]  nystatin (MYCOSTATIN/NYSTOP) powder Apply 1 g topically daily as needed (itching).    [provider]      Allergies    Doxycycline    Review of Systems   Review of Systems  Physical Exam Updated Vital Signs BP 135/69   Pulse 67   Temp 97.6 F (36.4 C)   Resp 19   Ht '5\' 10"'$  (1.778 m)   Wt 90.7 kg   SpO2 97%   BMI 28.70 kg/m  Physical Exam  ED Results / Procedures / Treatments   Labs (all labs ordered are listed, but only abnormal results are displayed) Labs Reviewed  COMPREHENSIVE METABOLIC PANEL - Abnormal; Notable for the following components:  Result Value   Glucose, Bld 102 (*)    All other components within normal limits  URINALYSIS, ROUTINE W REFLEX MICROSCOPIC - Abnormal; Notable for the following components:   Hgb urine dipstick LARGE (*)    RBC / HPF >50 (*)    Bacteria, UA RARE (*)    All other components within normal limits  CBC    EKG None  Radiology CT CERVICAL SPINE WO CONTRAST  Result Date: 05/30/2022 CLINICAL DATA:  Polytrauma, blunt EXAM: CT CERVICAL SPINE WITHOUT CONTRAST TECHNIQUE: Multidetector CT imaging of the cervical spine was performed without intravenous contrast. Multiplanar CT image reconstructions were also generated. RADIATION DOSE REDUCTION: This exam was performed according to the departmental dose-optimization program which includes automated exposure control, adjustment of the mA  and/or kV according to patient size and/or use of iterative reconstruction technique. COMPARISON:  None Available. FINDINGS: Alignment: Normal alignment. Skull base and vertebrae: Postoperative changes from anterior fusion from C4 to C7. Solid bony fusion across the disc spaces. No hardware complicating feature. No fracture or focal bone lesion. Soft tissues and spinal canal: No prevertebral fluid or swelling. No visible canal hematoma. Disc levels:  No visible disc herniation. Upper chest: No acute findings Other: None IMPRESSION: Changes of ACDF C4-C7. No acute bony abnormality. Electronically Signed   By: Rolm Baptise M.D.   On: 05/30/2022 19:56   CT HEAD WO CONTRAST  Result Date: 05/30/2022 CLINICAL DATA:  Head trauma, moderate-severe EXAM: CT HEAD WITHOUT CONTRAST TECHNIQUE: Contiguous axial images were obtained from the base of the skull through the vertex without intravenous contrast. RADIATION DOSE REDUCTION: This exam was performed according to the departmental dose-optimization program which includes automated exposure control, adjustment of the mA and/or kV according to patient size and/or use of iterative reconstruction technique. COMPARISON:  None Available. FINDINGS: Brain: No acute intracranial abnormality. Specifically, no hemorrhage, hydrocephalus, mass lesion, acute infarction, or significant intracranial injury. Vascular: No hyperdense vessel or unexpected calcification. Skull: No acute calvarial abnormality. Sinuses/Orbits: No acute findings Other: None IMPRESSION: Normal study. Electronically Signed   By: Rolm Baptise M.D.   On: 05/30/2022 19:55   DG Shoulder Left  Result Date: 05/30/2022 CLINICAL DATA:  Fall.  Trauma.  Hit left shoulder on metal fall. EXAM: LEFT SHOULDER - 2+ VIEW COMPARISON:  None Available. FINDINGS: Mildly decreased bone mineralization. Moderate soft tissue swelling superior to the left acromioclavicular joint. Oblique linear lucency within the superior aspect of  the left clavicular head suspicious for an acute nondisplaced fracture on frontal view, however this is not well visualized on the other views and could represent overlying soft tissue markings. Mild glenohumeral joint space narrowing and inferior glenoid degenerative osteophytosis. Mild acromioclavicular joint space narrowing and peripheral osteophytosis. Mild-to-moderate distal lateral subacromial spurring. Partial visualization of lower cervical spine ACDF hardware. Status post median sternotomy. Left chest wall cardiac loop recorder. IMPRESSION: Oblique linear lucency within the superior aspect of the left clavicular head suspicious for an acute nondisplaced fracture on frontal view, however this is not well visualized on the other views and could represent overlying soft tissue markings. Recommend correlation with point tenderness. There is moderate soft tissue swelling in this region, superior to the left acromioclavicular joint. Electronically Signed   By: Yvonne Kendall M.D.   On: 05/30/2022 19:36   DG FEMUR MIN 2 VIEWS LEFT  Result Date: 05/30/2022 CLINICAL DATA:  Fall.  Pain. EXAM: LEFT FEMUR 2 VIEWS COMPARISON:  CT abdomen and pelvis 08/04/2021 FINDINGS: Surgical clips are seen overlying  the medial left thigh and proximal calf diffusely. Mild superomedial left femoroacetabular joint space narrowing. Mild right medial compartment of the knee joint space narrowing. No acute fracture. No knee joint effusion. No dislocation. Mild-to-moderate vascular calcifications. Vascular phleboliths overlie the left hemipelvis. IMPRESSION: 1. No acute fracture. 2. Mild left femoroacetabular osteoarthritis. Electronically Signed   By: Yvonne Kendall M.D.   On: 05/30/2022 19:32   DG Chest Port 1 View  Result Date: 05/30/2022 CLINICAL DATA:  Fall.  Pain. EXAM: PORTABLE CHEST 1 VIEW COMPARISON:  AP chest 02/25/2022 FINDINGS: Status post median sternotomy. Left chest wall cardiac loop recorder is again seen. Cardiac  silhouette is at the upper limits of normal size. Mediastinal contours are within normal limits. The lungs are clear. No pleural effusion pneumothorax. Partial visualization of lower cervical spine ACDF hardware. Cholecystectomy clips. IMPRESSION: No acute cardiopulmonary disease. Electronically Signed   By: Yvonne Kendall M.D.   On: 05/30/2022 19:31   DG Pelvis Portable  Result Date: 05/30/2022 CLINICAL DATA:  Trauma. Fall. Was working out on medicine ball and struck left shoulder on metal pole. EXAM: PORTABLE PELVIS 1-2 VIEWS COMPARISON:  CT abdomen and pelvis 08/04/2021 FINDINGS: The bilateral sacroiliac and pubic symphysis joint spaces are maintained. Mild bilateral superomedial femoroacetabular joint space narrowing. Curvilinear chronic heterotopic bone lateral to the superior right acetabulum, unchanged. No acute fracture is seen. No dislocation. Surgical clips overlie the right lower abdominal quadrant and left groin. Vascular phleboliths within the left-greater-than-right pelvis. IMPRESSION: 1. No acute fracture. 2. Mild bilateral femoroacetabular osteoarthritis. Electronically Signed   By: Yvonne Kendall M.D.   On: 05/30/2022 19:29    Procedures Procedures  {Document cardiac monitor, telemetry assessment procedure when appropriate:1}  Medications Ordered in ED Medications - No data to display  ED Course/ Medical Decision Making/ A&P                           Medical Decision Making Amount and/or Complexity of Data Reviewed Labs: ordered. Radiology: ordered.   This patient presents to the ED with chief complaint(s) of *** with pertinent past medical history of *** which further complicates the presenting complaint. The complaint involves an extensive differential diagnosis and also carries with it a high risk of complications and morbidity.    The differential diagnosis includes but not limited to ***. Serious etiologies were considered.   The initial plan is to ***   Additional  history obtained: Additional history obtained from {additional history:26846} Records reviewed {records:26847}  Independent labs interpretation:  The following labs were independently interpreted: ***  Independent visualization of imaging: - I independently visualized the following imaging with scope of interpretation limited to determining acute life threatening conditions related to emergency care: ***, which revealed ***  Cardiac monitoring was reviewed and interpreted by myself which shows ***  Treatment and Reassessment: ***  Consultation: - Consulted or discussed management/test interpretation w/ external professional: ***  Consideration for admission or further workup: Admission was considered ***  Social Determinants of health: Social History   Tobacco Use   Smoking status: Never   Smokeless tobacco: Never  Vaping Use   Vaping Use: Never used  Substance Use Topics   Alcohol use: No   Drug use: No      {Document critical care time when appropriate:1} {Document review of labs and clinical decision tools ie heart score, Chads2Vasc2 etc:1}  {Document your independent review of radiology images, and any outside records:1} {Document your discussion with  family members, caretakers, and with consultants:1} {Document social determinants of health affecting pt's care:1} {Document your decision making why or why not admission, treatments were needed:1} Final Clinical Impression(s) / ED Diagnoses Final diagnoses:  None    Rx / DC Orders ED Discharge Orders     None

## 2022-05-30 NOTE — Progress Notes (Signed)
Orthopedic Tech Progress Note Patient Details:  Keith Phelps 03-15-1946 [id number]  Level 2 trauma, ortho not needed at this time.    Patient ID: Keith Phelps, male   DOB: 11/30/1945, 76 y.o.   MRN: [id number]  Keith Phelps 05/30/2022, 7:02 PM

## 2022-05-30 NOTE — Discharge Instructions (Addendum)
Based on the events which brought you to the ER today, it is possible that you may have a concussion. A concussion occurs when there is a blow to the head or body, with enough force to shake the brain and disrupt how the brain functions. You may experience symptoms such as headaches, sensitivity to light/noise, dizziness, cognitive slowing, difficulty concentrating / remembering, trouble sleeping and drowsiness. These symptoms may last anywhere from hours/days to potentially weeks/months. While these symptoms are very frustrating and perhaps debilitating, it is important that you remember that they will improve over time. Everyone has a different rate of recovery; it is difficult to predict when your symptoms will resolve. In order to allow for your brain to heal after the injury, we recommend that you see your primary physician or a physician knowledgeable in concussion management. We also advise you to let your body and brain rest: avoid physical activities (sports, gym, and exercise) and reduce cognitive demands (reading, texting, TV watching, computer use, video games, etc). School attendance, after-school activities and work may need to be modified to avoid increasing symptoms. We recommend against driving until until all symptoms have resolved. Come back to the ER right away if you are having repeated episodes of vomiting, severe/worsening headache/dizziness or any other symptom that alarms you. We recommended that someone stay with you for the next 24 hours to monitor for these worrisome symptoms.   It was a pleasure caring for you today in the emergency department.  Please return to the emergency department for any worsening or worrisome symptoms.    Please follow up with orthopedics Dr Rolena Infante in around 1 week in regards to you nondisplaced clavicle fracture

## 2022-06-09 ENCOUNTER — Ambulatory Visit (INDEPENDENT_AMBULATORY_CARE_PROVIDER_SITE_OTHER): Payer: Medicare HMO

## 2022-06-09 DIAGNOSIS — Z951 Presence of aortocoronary bypass graft: Secondary | ICD-10-CM

## 2022-06-11 LAB — CUP PACEART REMOTE DEVICE CHECK
Date Time Interrogation Session: 20231130020038
Implantable Pulse Generator Implant Date: 20230629
Pulse Gen Serial Number: 511010278

## 2022-06-11 NOTE — Progress Notes (Signed)
[facility] Summary Report / Loop Recorder 

## 2022-06-19 NOTE — Progress Notes (Unsigned)
Electrophysiology Office Follow up Visit Note:    Date:  06/19/2022   ID:  Keith Phelps, DOB 1945-11-01, MRN [id number]  PCP:  Rikki Spearing, NP  [facility] Cardiologist:  Pixie Casino, MD  [facility] Electrophysiologist:  Vickie Epley, MD    Interval History:    Keith Phelps is a 76 y.o. male who presents for a follow up visit. They were last seen in clinic January 06, 2022 for history of atrial flutter in the setting of a DVT/PE.  We are using a loop recorder for monitoring of his heart rhythm.  We planned to monitor his heart rhythm for 6 months after the last appointment and if no recurrence of his arrhythmia, consider discontinuation of his anticoagulant.  Loop interrogations have shown no arrhythmias since implant.     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[place])    Sleep apnea    Umbilical hernia     Past Surgical History:  Procedure Laterality Date   APPENDECTOMY     CARDIAC CATHETERIZATION  06/13/2005   [place]  EF 55%   CHOLECYSTECTOMY     CORONARY ARTERY BYPASS GRAFT  07/18/2000   x 4   DOPPLER ECHOCARDIOGRAPHY  02/21/2012   EF > 55%   DOPPLER ECHOCARDIOGRAPHY  03/16/2010   EF 45-50%   INSERTION OF MESH N/A 01/04/2017   Procedure: INSERTION OF MESH;  Surgeon: Jackolyn Confer, MD;  Location: [facility];  Service: General;  Laterality: N/A;   NM MYOVIEW LTD  03/16/2010   EF 64%  Low risk study   right shoulder arthroscopy  2229   UMBILICAL HERNIA REPAIR N/A 01/04/2017   Procedure: REPAIR CHRONICALLY INCARCERATED UMBILICAL HERNIA;  Surgeon: Jackolyn Confer, MD;  Location: [facility];  Service: General;  Laterality: N/A;    Current Medications: No outpatient medications have been marked as taking for the 06/20/22 encounter  (Appointment) with Vickie Epley, MD.     Allergies:   Doxycycline   Social History   Socioeconomic History   Marital status: Divorced    Spouse name: Not on file   Number of children: Not on file   Years of education: Not on file   Highest education level: Not on file  Occupational History   Not on file  Tobacco Use   Smoking status: Never   Smokeless tobacco: Never  Vaping Use   Vaping Use: Never used  Substance and Sexual Activity   Alcohol use: No   Drug use: No   Sexual activity: Not on file  Other Topics Concern   Not on file  Social History Narrative   Not on file   Social Determinants of Health   Financial Resource Strain: Not on file  Food Insecurity: Not on file  Transportation Needs: Not on file  Physical Activity: Not on file  Stress: Not on file  Social Connections: Not on file     Family History: The patient's family history includes Heart disease in his father; Stroke in his father.  ROS:   Please see the history of present illness.    All other systems reviewed and are negative.  EKGs/Labs/Other Studies Reviewed:    The following studies were reviewed today:  Loop interrogations  personally reviewed    Recent Labs: 05/30/2022: ALT 30; BUN 10; Creatinine, Ser 1.06; Hemoglobin 13.7; Platelets 265; Potassium 4.3; Sodium 140  Recent Lipid Panel    Component Value Date/Time   CHOL 130 02/19/2019 0905   TRIG 133 02/19/2019 0905   HDL 39 (L) 02/19/2019 0905   CHOLHDL 3.3 02/19/2019 0905   CHOLHDL 3.8 05/07/2018 1255   VLDL 24 03/29/2016 1023   LDLCALC 64 02/19/2019 0905   LDLCALC 103 (H) 05/07/2018 1255    Physical Exam:    VS:  There were no vitals taken for this visit.    Wt Readings from Last 3 Encounters:  05/30/22 200 lb (90.7 kg)  05/23/22 204 lb 3.2 oz (92.6 kg)  02/25/22 199 lb 15.3 oz (90.7 kg)     GEN: *** Well nourished, well developed in no acute distress HEENT: Normal NECK: No JVD; No carotid  bruits LYMPHATICS: No lymphadenopathy CARDIAC: ***RRR, no murmurs, rubs, gallops RESPIRATORY:  Clear to auscultation without rales, wheezing or rhonchi  ABDOMEN: Soft, non-tender, non-distended MUSCULOSKELETAL:  No edema; No deformity  SKIN: Warm and dry NEUROLOGIC:  Alert and oriented x 3 PSYCHIATRIC:  Normal affect        ASSESSMENT:    1. Paroxysmal atrial flutter ([place])   2. Coronary artery disease involving native coronary artery of native heart without angina pectoris   3. S/P CABG x 4   4. History of pulmonary embolism    PLAN:    In order of problems listed above:   #Atrial flutter In the setting of acute DVT PE.  Loop recorder monitoring has shown no recurrence of atrial fibrillation or flutter.  He has been taking Eliquis for 6 months.  His DVT/PE was provoked in the setting of neck surgery.  At this point, I think it is reasonable for him to stop his anticoagulant given no recurrence of his atrial fibrillation and flutter.  Further, his DVT/PE was clearly provoked in the setting of immobility following neck surgery.  I recommended that he take aspirin 81 mg by mouth once daily once he stops his Eliquis.  #Coronary artery disease #History of CABG Follows with Dr. Debara Pickett.  No ischemic symptoms today.  #History of DVT/PE As above.  Stop blood thinner.  Provoked.  Follow-up as needed.   Medication Adjustments/Labs and Tests Ordered: Current medicines are reviewed at length with the patient today.  Concerns regarding medicines are outlined above.  No orders of the defined types were placed in this encounter.  No orders of the defined types were placed in this encounter.    Signed, Lars Mage, MD, [facility], [facility] 06/19/2022 4:59 PM    Electrophysiology [facility]

## 2022-06-20 ENCOUNTER — Ambulatory Visit: Payer: Medicare HMO | Attending: Cardiology | Admitting: Cardiology

## 2022-06-20 ENCOUNTER — Encounter: Payer: Self-pay | Admitting: Cardiology

## 2022-06-20 VITALS — BP 114/64 | HR 59 | Ht 70.0 in | Wt 205.0 lb

## 2022-06-20 DIAGNOSIS — Z86711 Personal history of pulmonary embolism: Secondary | ICD-10-CM

## 2022-06-20 DIAGNOSIS — I4892 Unspecified atrial flutter: Secondary | ICD-10-CM

## 2022-06-20 DIAGNOSIS — Z951 Presence of aortocoronary bypass graft: Secondary | ICD-10-CM

## 2022-06-20 DIAGNOSIS — I2510 Atherosclerotic heart disease of native coronary artery without angina pectoris: Secondary | ICD-10-CM | POA: Diagnosis not present

## 2022-06-20 MED ORDER — ASPIRIN 81 MG PO TBEC: 81.0000 mg | DELAYED_RELEASE_TABLET | Freq: Every day | ORAL | 3 refills | Status: AC

## 2022-06-20 NOTE — Progress Notes (Addendum)
Electrophysiology Office Follow up Visit Note:    Date:  06/20/2022   ID:  Keith Phelps, DOB 1946/02/24, MRN [id number]  PCP:  Rikki Spearing, NP  [facility] Cardiologist:  Pixie Casino, MD  [facility] Electrophysiologist:  Vickie Epley, MD    Interval History:    Keith Phelps is a 76 y.o. male who presents for a follow up visit. They were last seen in clinic January 06, 2022 for history of atrial flutter in the setting of a DVT/PE.  We are using a loop recorder for monitoring of his heart rhythm.  We planned to monitor his heart rhythm for 6 months after the last appointment and if no recurrence of his arrhythmia, consider discontinuation of his anticoagulant.  Loop interrogations have shown no arrhythmias since implant.  Today, he reports having 2 incidents since his surgery in 07/2021. First he had an episode of sudden fatigue and weakness and presented to the ED 02/2022. This was in the setting of returning to his workout routines.  Second, about 2-3 weeks ago, he tripped and suffered a hairline fracture of his left collarbone. This was treated with conservative management. He wore a sling for 2 weeks. Currently he is able to complete ADL's comfortably. He notes that just before his fall he had successfully worked up to lifting 10 lb weights.  He has been sleeping in a recliner. Occasionally he has mild LE edema. He attributes this to his prior CABG 2002.  He denies any palpitations, chest pain, or shortness of breath. No lightheadedness, headaches, syncope, orthopnea, or PND.      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[place])    Sleep apnea    Umbilical hernia     Past Surgical History:  Procedure Laterality Date   APPENDECTOMY     CARDIAC  CATHETERIZATION  06/13/2005   [place]  EF 55%   CHOLECYSTECTOMY     CORONARY ARTERY BYPASS GRAFT  07/18/2000   x 4   DOPPLER ECHOCARDIOGRAPHY  02/21/2012   EF > 55%   DOPPLER ECHOCARDIOGRAPHY  03/16/2010   EF 45-50%   INSERTION OF MESH N/A 01/04/2017   Procedure: INSERTION OF MESH;  Surgeon: Jackolyn Confer, MD;  Location: [facility];  Service: General;  Laterality: N/A;   NM MYOVIEW LTD  03/16/2010   EF 64%  Low risk study   right shoulder arthroscopy  9518   UMBILICAL HERNIA REPAIR N/A 01/04/2017   Procedure: REPAIR CHRONICALLY INCARCERATED UMBILICAL HERNIA;  Surgeon: Jackolyn Confer, MD;  Location: [facility];  Service: General;  Laterality: N/A;    Current Medications: Current Meds  Medication Sig   alfuzosin (UROXATRAL) 10 MG 24 hr tablet Take 10 mg by mouth daily.   apixaban (ELIQUIS) 5 MG TABS tablet Take by mouth.   atorvastatin (LIPITOR) 40 MG tablet Take 1 tablet (40 mg total) by mouth daily. OV NEEDED   B Complex-C (B-COMPLEX WITH VITAMIN C) tablet Take 1 tablet by mouth daily.   ezetimibe (ZETIA) 10 MG tablet TAKE 1 TABLET BY MOUTH EVERY DAY   fenofibrate 160 MG tablet TAKE 1 TABLET (160 MG TOTAL) BY MOUTH DAILY. *OFFICE VISIT NEEDED*   hydrocortisone 1 % ointment Apply 1 application topically  as needed for itching.   nystatin (MYCOSTATIN/NYSTOP) powder Apply 1 g topically daily as needed (itching).     Allergies:   Doxycycline   Social History   Socioeconomic History   Marital status: Divorced    Spouse name: Not on file   Number of children: Not on file   Years of education: Not on file   Highest education level: Not on file  Occupational History   Not on file  Tobacco Use   Smoking status: Never   Smokeless tobacco: Never  Vaping Use   Vaping Use: Never used  Substance and Sexual Activity   Alcohol use: No   Drug use: No   Sexual activity: Not on file  Other Topics Concern   Not on file  Social History Narrative   Not on file   Social Determinants  of Health   Financial Resource Strain: Not on file  Food Insecurity: Not on file  Transportation Needs: Not on file  Physical Activity: Not on file  Stress: Not on file  Social Connections: Not on file     Family History: The patient's family history includes Heart disease in his father; Stroke in his father.  ROS:   Please see the history of present illness.    (+) Mechanical fall (+) LE Edema All other systems reviewed and are negative.  EKGs/Labs/Other Studies Reviewed:    The following studies were reviewed today:  Loop interrogations personally reviewed  Loop interrogated today and shows no atrial fibrillation episodes.  Battery okay.  Presenting rhythm sinus.  Recent Labs: 05/30/2022: ALT 30; BUN 10; Creatinine, Ser 1.06; Hemoglobin 13.7; Platelets 265; Potassium 4.3; Sodium 140   Recent Lipid Panel    Component Value Date/Time   CHOL 130 02/19/2019 0905   TRIG 133 02/19/2019 0905   HDL 39 (L) 02/19/2019 0905   CHOLHDL 3.3 02/19/2019 0905   CHOLHDL 3.8 05/07/2018 1255   VLDL 24 03/29/2016 1023   LDLCALC 64 02/19/2019 0905   LDLCALC 103 (H) 05/07/2018 1255    Physical Exam:    VS:  BP 114/64   Pulse (!) 59   Ht '5\' 10"'$  (1.778 m)   Wt 205 lb (93 kg)   SpO2 97%   BMI 29.41 kg/m     Wt Readings from Last 3 Encounters:  06/20/22 205 lb (93 kg)  05/30/22 200 lb (90.7 kg)  05/23/22 204 lb 3.2 oz (92.6 kg)     GEN: Well nourished, well developed in no acute distress HEENT: Normal NECK: No JVD; No carotid bruits LYMPHATICS: No lymphadenopathy CARDIAC: RRR, no murmurs, rubs, gallops RESPIRATORY:  Clear to auscultation without rales, wheezing or rhonchi  ABDOMEN: Soft, non-tender, non-distended MUSCULOSKELETAL:  No edema; No deformity  SKIN: Warm and dry NEUROLOGIC:  Alert and oriented x 3 PSYCHIATRIC:  Normal affect        ASSESSMENT:    1. Paroxysmal atrial flutter ([place])   2. Coronary artery disease involving native coronary artery of native  heart without angina pectoris   3. S/P CABG x 4   4. History of pulmonary embolism    PLAN:    In order of problems listed above:   #Atrial flutter In the setting of acute DVT PE.  Loop recorder monitoring has shown no recurrence of atrial fibrillation or flutter.  He has been taking Eliquis for 6 months.  His DVT/PE was provoked in the setting of neck surgery.  At this point, I think it is reasonable for him to stop  his anticoagulant given no recurrence of his atrial fibrillation and flutter.  Further, his DVT/PE was clearly provoked in the setting of immobility following neck surgery.  I recommended that he take aspirin 81 mg by mouth once daily once he stops his Eliquis.  #Coronary artery disease #History of CABG Follows with Dr. Debara Pickett.  No ischemic symptoms today.  #History of DVT/PE As above.  Stop blood thinner.  Provoked.  Follow-up in 1 year.   Medication Adjustments/Labs and Tests Ordered: Current medicines are reviewed at length with the patient today.  Concerns regarding medicines are outlined above.   No orders of the defined types were placed in this encounter.  No orders of the defined types were placed in this encounter.  I,Mathew Stumpf,acting as a Education administrator for Vickie Epley, MD.,have documented all relevant documentation on the behalf of Vickie Epley, MD,as directed by  Vickie Epley, MD while in the presence of Vickie Epley, MD.  I, Vickie Epley, MD, have reviewed all documentation for this visit. The documentation on 06/20/22 for the exam, diagnosis, procedures, and orders are all accurate and complete.   Signed, Lars Mage, MD, [facility], [facility] 06/20/2022 10:43 AM    Electrophysiology [facility]

## 2022-06-20 NOTE — Patient Instructions (Signed)
Medication Instructions:  Your physician has recommended you make the following change in your medication:  1) STOP taking Eliquis  2) START taking Aspirin 81 mg daily *If you need a refill on your cardiac medications before your next appointment, please call your pharmacy*  Follow-Up: At [facility], you and your health needs are our priority.  As part of our continuing mission to provide you with exceptional heart care, we have created designated Provider Care Teams.  These Care Teams include your primary Cardiologist (physician) and Advanced Practice Providers (APPs -  Physician Assistants and Nurse Practitioners) who all work together to provide you with the care you need, when you need it.  Your next appointment:   1 year(s)  The format for your next appointment:   In Person  Provider:   You may see Vickie Epley, MD or one of the following Advanced Practice Providers on your designated Care Team:   Tommye Standard, [place] Legrand Como "Jonni Sanger" Chalmers Cater, [place]   Important Information About Sugar

## 2022-06-24 ENCOUNTER — Encounter: Payer: Self-pay | Admitting: Cardiology

## 2022-06-27 ENCOUNTER — Encounter: Payer: Self-pay | Admitting: Internal Medicine

## 2022-06-28 NOTE — Telephone Encounter (Signed)
Blood pressures in the office this past year have been normal - if he has not been taking ramipril since 07/2021, I would not start taking it at this point since he is not hypertensive. Eliquis was stopped in December by Dr. Quentin Ore and should be off the list. He is supposed to only take aspirin 81 mg daily.  Dr. Debara Pickett

## 2022-06-29 NOTE — Progress Notes (Signed)
[facility] Summary Report / Loop Recorder 

## 2022-07-12 ENCOUNTER — Ambulatory Visit (INDEPENDENT_AMBULATORY_CARE_PROVIDER_SITE_OTHER): Payer: Medicare HMO

## 2022-07-12 DIAGNOSIS — I4892 Unspecified atrial flutter: Secondary | ICD-10-CM

## 2022-07-12 LAB — CUP PACEART REMOTE DEVICE CHECK
Date Time Interrogation Session: 20231231020044
Implantable Pulse Generator Implant Date: 20230629
Pulse Gen Serial Number: 511010278

## 2022-08-01 MED ORDER — ATORVASTATIN CALCIUM 40 MG PO TABS: 40.0000 mg | ORAL_TABLET | Freq: Every day | ORAL | 2 refills | Status: DC

## 2022-08-10 LAB — CUP PACEART REMOTE DEVICE CHECK
Date Time Interrogation Session: 20240131020042
Implantable Pulse Generator Implant Date: 20230629
Pulse Gen Serial Number: 511010278

## 2022-08-11 ENCOUNTER — Ambulatory Visit (INDEPENDENT_AMBULATORY_CARE_PROVIDER_SITE_OTHER): Payer: Medicare HMO

## 2022-08-11 DIAGNOSIS — I4892 Unspecified atrial flutter: Secondary | ICD-10-CM

## 2022-08-16 NOTE — Progress Notes (Signed)
[facility] Summary Report / Loop Recorder 

## 2022-09-01 NOTE — Progress Notes (Signed)
[facility] Summary Report / Loop Recorder 

## 2022-09-12 ENCOUNTER — Ambulatory Visit (INDEPENDENT_AMBULATORY_CARE_PROVIDER_SITE_OTHER): Payer: Medicare HMO

## 2022-09-12 DIAGNOSIS — I4892 Unspecified atrial flutter: Secondary | ICD-10-CM

## 2022-09-13 LAB — CUP PACEART REMOTE DEVICE CHECK
Date Time Interrogation Session: 20240304020123
Implantable Pulse Generator Implant Date: 20230629
Pulse Gen Serial Number: 511010278

## 2022-09-18 ENCOUNTER — Other Ambulatory Visit: Payer: Self-pay | Admitting: Cardiovascular Disease

## 2022-09-18 ENCOUNTER — Other Ambulatory Visit: Payer: Self-pay | Admitting: Internal Medicine

## 2022-10-13 ENCOUNTER — Ambulatory Visit (INDEPENDENT_AMBULATORY_CARE_PROVIDER_SITE_OTHER): Payer: Medicare HMO

## 2022-10-13 DIAGNOSIS — I4892 Unspecified atrial flutter: Secondary | ICD-10-CM | POA: Diagnosis not present

## 2022-10-13 LAB — CUP PACEART REMOTE DEVICE CHECK
Date Time Interrogation Session: 20240404020312
Implantable Pulse Generator Implant Date: 20230629
Pulse Gen Serial Number: 511010278

## 2022-10-24 NOTE — Progress Notes (Signed)
[place] Loop Recorder 

## 2022-11-14 ENCOUNTER — Ambulatory Visit (INDEPENDENT_AMBULATORY_CARE_PROVIDER_SITE_OTHER): Payer: Medicare HMO

## 2022-11-14 DIAGNOSIS — I4892 Unspecified atrial flutter: Secondary | ICD-10-CM

## 2022-11-14 LAB — CUP PACEART REMOTE DEVICE CHECK
Date Time Interrogation Session: 20240506020240
Implantable Pulse Generator Implant Date: 20230629
Pulse Gen Serial Number: 511010278

## 2022-11-16 NOTE — Progress Notes (Signed)
[facility] Summary Report / Loop Recorder 

## 2022-12-07 ENCOUNTER — Ambulatory Visit: Payer: Medicare HMO | Admitting: Dermatology

## 2022-12-07 ENCOUNTER — Encounter: Payer: Self-pay | Admitting: Dermatology

## 2022-12-07 VITALS — BP 92/57

## 2022-12-07 DIAGNOSIS — D1801 Hemangioma of skin and subcutaneous tissue: Secondary | ICD-10-CM

## 2022-12-07 DIAGNOSIS — L570 Actinic keratosis: Secondary | ICD-10-CM

## 2022-12-07 DIAGNOSIS — Z1283 Encounter for screening for malignant neoplasm of skin: Secondary | ICD-10-CM

## 2022-12-07 DIAGNOSIS — L814 Other melanin hyperpigmentation: Secondary | ICD-10-CM | POA: Diagnosis not present

## 2022-12-07 DIAGNOSIS — L219 Seborrheic dermatitis, unspecified: Secondary | ICD-10-CM

## 2022-12-07 DIAGNOSIS — X32XXXA Exposure to sunlight, initial encounter: Secondary | ICD-10-CM

## 2022-12-07 DIAGNOSIS — L821 Other seborrheic keratosis: Secondary | ICD-10-CM | POA: Diagnosis not present

## 2022-12-07 DIAGNOSIS — W908XXA Exposure to other nonionizing radiation, initial encounter: Secondary | ICD-10-CM | POA: Diagnosis not present

## 2022-12-07 DIAGNOSIS — L578 Other skin changes due to chronic exposure to nonionizing radiation: Secondary | ICD-10-CM

## 2022-12-07 NOTE — Patient Instructions (Addendum)
Cryotherapy Aftercare  Wash gently with soap and water everyday.   Apply Vaseline and Band-Aid daily until healed.     Treatment Plan: Continue 1% Hydrocortisone cream 2 x daily for 2 weeks as needed Advised DHS Zinc shampoo for shampooing   Due to recent changes in healthcare laws, you may see results of your pathology and/or laboratory studies on MyChart before the doctors have had a chance to review them. We understand that in some cases there may be results that are confusing or concerning to you. Please understand that not all results are received at the same time and often the doctors may need to interpret multiple results in order to provide you with the best plan of care or course of treatment. Therefore, we ask that you please give [place] 2 business days to thoroughly review all your results before contacting the office for clarification. Should we see a critical lab result, you will be contacted sooner.   If You Need Anything After Your Visit  If you have any questions or concerns for your doctor, please call our main line at [phone] If no one answers, please leave a voicemail as directed and we will return your call as soon as possible. Messages left after 4 pm will be answered the following business day.   You may also send [place] a message via MyChart. We typically respond to MyChart messages within 1-2 business days.  For prescription refills, please ask your pharmacy to contact our office. Our fax number is [phone].  If you have an urgent issue when the clinic is closed that cannot wait until the next business day, you can page your doctor at the number below.    Please note that while we do our best to be available for urgent issues outside of office hours, we are not available 24/7.   If you have an urgent issue and are unable to reach [place], you may choose to seek medical care at your doctor's office, retail clinic, urgent care center, or emergency room.  If you have a  medical emergency, please immediately call 911 or go to the emergency department. In the event of inclement weather, please call our main line at [phone] for an update on the status of any delays or closures.  Dermatology Medication Tips: Please keep the boxes that topical medications come in in order to help keep track of the instructions about where and how to use these. Pharmacies typically print the medication instructions only on the boxes and not directly on the medication tubes.   If your medication is too expensive, please contact our office at [phone] or send [place] a message through MyChart.   We are unable to tell what your co-pay for medications will be in advance as this is different depending on your insurance coverage. However, we may be able to find a substitute medication at lower cost or fill out paperwork to get insurance to cover a needed medication.   If a prior authorization is required to get your medication covered by your insurance company, please allow [place] 1-2 business days to complete this process.  Drug prices often vary depending on where the prescription is filled and some pharmacies may offer cheaper prices.  The website www.goodrx.com contains coupons for medications through different pharmacies. The prices here do not account for what the cost may be with help from insurance (it may be cheaper with your insurance), but the website can give you the price if you did not use  any insurance.  - You can print the associated coupon and take it with your prescription to the pharmacy.  - You may also stop by our office during regular business hours and pick up a GoodRx coupon card.  - If you need your prescription sent electronically to a different pharmacy, notify our office through [facility] or by phone at [phone]    Skin Education :   I counseled the patient regarding the following: Sun screen (SPF 30 or greater) should be applied during peak UV  exposure (between 10am and 2pm) and reapplied after exercise or swimming.  The ABCDEs of melanoma were reviewed with the patient, and the importance of monthly self-examination of moles was emphasized. Should any moles change in shape or color, or itch, bleed or burn, pt will contact our office for evaluation sooner then their interval appointment.  Plan: Sunscreen Recommendations I recommended a broad spectrum sunscreen with a SPF of 30 or higher. I explained that SPF 30 sunscreens block approximately 97 percent of the sun's harmful rays. Sunscreens should be applied at least 15 minutes prior to expected sun exposure and then every 2 hours after that as long as sun exposure continues. If swimming or exercising sunscreen should be reapplied every 45 minutes to an hour after getting wet or sweating. One ounce, or the equivalent of a shot glass full of sunscreen, is adequate to protect the skin not covered by a bathing suit. I also recommended a lip balm with a sunscreen as well. Sun protective clothing can be used in lieu of sunscreen but must be worn the entire time you are exposed to the sun's rays. I counseled the patient regarding the following: Skin Care: Actinic Damage can improve with broad spectrum sunscreen, sun avoidance, bleaching creams, retinoids, chemical peels and laser. Expectations: Actinic Damage is photo-aging from excessive sun exposure. It manifests as unwanted pigmentation, wrinkles and textural thinning of the skin.  I recommended the following: Broad Spectrum Sunscreen SPF 30+ - SPF 30 daily to face, neck, chest and hands, reapplying every 3 hours when outside for long periods of time

## 2022-12-07 NOTE — Progress Notes (Signed)
New Patient Visit   Subjective  Keith Phelps is a 77 y.o. male who presents for the following: Skin Cancer Screening and Full Body Skin Exam His last skin exam was 5 years. He has no personal or family history of skin cancer. He says he has a history of Seborrheic Dermatitis on the face and scalp since he was 77 y/o. He uses Suave for men for dandruff and 1% Hydrocortisone cream as needed. It is flaring up.  The patient presents for Total-Body Skin Exam (TBSE) for skin cancer screening and mole check. The patient has spots, moles and lesions to be evaluated, some may be new or changing and the patient has concerns that these could be cancer.    The following portions of the chart were reviewed this encounter and updated as appropriate: medications, allergies, medical history  Review of Systems:  No other skin or systemic complaints except as noted in HPI or Assessment and Plan.  Objective  Well appearing patient in no apparent distress; mood and affect are within normal limits.  A full examination was performed including scalp, head, eyes, ears, nose, lips, neck, chest, axillae, abdomen, back, buttocks, bilateral upper extremities, bilateral lower extremities, hands, feet, fingers, toes, fingernails, and toenails. All findings within normal limits unless otherwise noted below.   Relevant physical exam findings are noted in the Assessment and Plan.  Left Forearm, left hand, right forearm (12) Erythematous thin papules/macules with gritty scale.     Assessment & Plan   LENTIGINES, SEBORRHEIC KERATOSES, HEMANGIOMAS - Benign normal skin lesions - Benign-appearing - Call for any changes  MELANOCYTIC NEVI - Tan-brown and/or pink-flesh-colored symmetric macules and papules - Benign appearing on exam today - Observation - Call clinic for new or changing moles - Recommend daily use of broad spectrum spf 30+ sunscreen to sun-exposed areas.   ACTINIC DAMAGE - Chronic condition,  secondary to cumulative UV/sun exposure - diffuse scaly erythematous macules with underlying dyspigmentation - Recommend daily broad spectrum sunscreen SPF 30+ to sun-exposed areas, reapply every 2 hours as needed.  - Staying in the shade or wearing long sleeves, sun glasses (UVA+UVB protection) and wide brim hats (4-inch brim around the entire circumference of the hat) are also recommended for sun protection.  - Call for new or changing lesions.   SEBORRHEIC DERMATITIS Exam: Pink patches with greasy scale at the scalp and face  Seborrheic Dermatitis is a chronic persistent rash characterized by pinkness and scaling most commonly of the mid face but also can occur on the scalp (dandruff), ears; mid chest, mid back and groin.  It tends to be exacerbated by stress and cooler weather.  People who have neurologic disease may experience new onset or exacerbation of existing seborrheic dermatitis.  The condition is not curable but treatable and can be controlled.  Treatment Plan: Continue 1% Hydrocortisone cream 2 x daily for 2 weeks as needed Advised DHS Zinc shampoo for shampooing   SKIN CANCER SCREENING PERFORMED TODAY.    AK (actinic keratosis) (12) Left Forearm, left hand, right forearm  Destruction of lesion - Left Forearm, left hand, right forearm Complexity: simple   Destruction method: cryotherapy   Informed consent: discussed and consent obtained   Timeout:  patient name, date of birth, surgical site, and procedure verified Lesion destroyed using liquid nitrogen: Yes   Region frozen until ice ball extended beyond lesion: Yes   Outcome: patient tolerated procedure well with no complications   Post-procedure details: wound care instructions given  Return in about 6 months (around 06/09/2023) for [place] Follow Up.  Jaclynn Guarneri, CMA, am acting as scribe for [employer], DO.   Documentation: I have reviewed the above documentation for accuracy and completeness, and I  agree with the above.  Langston Reusing, DO

## 2022-12-08 ENCOUNTER — Ambulatory Visit: Payer: Medicare HMO | Attending: Nurse Practitioner | Admitting: Nurse Practitioner

## 2022-12-08 ENCOUNTER — Encounter: Payer: Self-pay | Admitting: Nurse Practitioner

## 2022-12-08 VITALS — BP 138/64 | HR 60 | Ht 70.0 in | Wt 200.4 lb

## 2022-12-08 DIAGNOSIS — I6523 Occlusion and stenosis of bilateral carotid arteries: Secondary | ICD-10-CM | POA: Diagnosis not present

## 2022-12-08 DIAGNOSIS — I2510 Atherosclerotic heart disease of native coronary artery without angina pectoris: Secondary | ICD-10-CM | POA: Diagnosis not present

## 2022-12-08 DIAGNOSIS — I4892 Unspecified atrial flutter: Secondary | ICD-10-CM

## 2022-12-08 DIAGNOSIS — E785 Hyperlipidemia, unspecified: Secondary | ICD-10-CM

## 2022-12-08 DIAGNOSIS — Z86711 Personal history of pulmonary embolism: Secondary | ICD-10-CM

## 2022-12-08 NOTE — Patient Instructions (Addendum)
Medication Instructions:  Your physician recommends that you continue on your current medications as directed. Please refer to the Current Medication list given to you today.   *If you need a refill on your cardiac medications before your next appointment, please call your pharmacy*   Lab Work: NONE ordered at this time of appointment   Testing/Procedures: NONE ordered at this time of appointment   Follow-Up: At [facility], you and your health needs are our priority.  As part of our continuing mission to provide you with exceptional heart care, we have created designated Provider Care Teams.  These Care Teams include your primary Cardiologist (physician) and Advanced Practice Providers (APPs -  Physician Assistants and Nurse Practitioners) who all work together to provide you with the care you need, when you need it.  We recommend signing up for the patient portal called "MyChart".  Sign up information is provided on this After Visit Summary.  MyChart is used to connect with patients for Virtual Visits (Telemedicine).  Patients are able to view lab/test results, encounter notes, upcoming appointments, etc.  Non-urgent messages can be sent to your provider as well.   To learn more about what you can do with MyChart, go to [email or web address].    Your next appointment:   1 year(s)  Provider:   Chrystie Nose, MD     Other Instructions

## 2022-12-08 NOTE — Progress Notes (Signed)
Office Visit    Patient Name: Keith Phelps Date of Encounter: 12/08/2022  Primary Care Provider:  Felipa Furnace, NP Primary Cardiologist:  Chrystie Nose, MD  Chief Complaint    77 year old male with a history of CAD s/p CABG x 4 (LIMA-LAD, SVG-RCA, SVG-ramus, SVG-OM) in 2002, atrial flutter s/p loop recorder, PE, hyperlipidemia, BPH, and OSA who presents for follow-up related to CAD and atrial fibrillation.  Past Medical History    Past Medical History:  Diagnosis Date   Actinic keratosis    Atrial fibrillation (HCC)    BPH (benign prostatic hyperplasia)    Coronary artery disease    Dermatitis    saborah   Hyperlipidemia    MI (myocardial infarction) (HCC)    Saddle embolism of pulmonary artery (HCC)    Sleep apnea    Umbilical hernia    Past Surgical History:  Procedure Laterality Date   APPENDECTOMY     CARDIAC CATHETERIZATION  06/13/2005   [facility]  EF 55%   CHOLECYSTECTOMY     CORONARY ARTERY BYPASS GRAFT  07/18/2000   x 4   DOPPLER ECHOCARDIOGRAPHY  02/21/2012   EF > 55%   DOPPLER ECHOCARDIOGRAPHY  03/16/2010   EF 45-50%   INSERTION OF MESH N/A 01/04/2017   Procedure: INSERTION OF MESH;  Surgeon: Avel Peace, MD;  Location: [facility];  Service: General;  Laterality: N/A;   NM MYOVIEW LTD  03/16/2010   EF 64%  Low risk study   right shoulder arthroscopy  2015   UMBILICAL HERNIA REPAIR N/A 01/04/2017   Procedure: REPAIR CHRONICALLY INCARCERATED UMBILICAL HERNIA;  Surgeon: Avel Peace, MD;  Location: [facility];  Service: General;  Laterality: N/A;    Allergies  Allergies  Allergen Reactions   Doxycycline Itching, Rash and Other (See Comments)    fever     Labs/Other Studies Reviewed    The following studies were reviewed today:  Cardiac Studies & Procedures     STRESS TESTS  MYOCARDIAL PERFUSION IMAGING 12/22/2016  Narrative  Nuclear stress EF: 52%.  The left ventricular ejection fraction is mildly decreased (45-54%).  Blood  pressure demonstrated a normal response to exercise.  There was no ST segment deviation noted during stress.  The study is normal.  Normal resting and stress perfusion. No ischemia or infarction EF 52%   ECHOCARDIOGRAM  ECHOCARDIOGRAM COMPLETE 08/30/2021  Narrative ECHOCARDIOGRAM REPORT    Patient Name:   Keith Phelps Date of Exam: 08/30/2021 Medical Rec #:  [id number]        Height:       70.0 in Accession #:    [id number]       Weight:       187.0 lb Date of Birth:  08-25-1945        BSA:          2.029 m Patient Age:    75 years         BP:           124/76 mmHg Patient Gender: M                HR:           75 bpm. Exam Location:  Outpatient  Procedure: 2D Echo, Color Doppler, Cardiac Doppler and 3D Echo  Indications:    CAD Native Vessel I25.10  History:        Patient has prior history of Echocardiogram examinations, most recent 02/21/2012. Prior CABG; Risk Factors:Dyslipidemia.  Sonographer:    Thurman Coyer RDCS Referring Phys: [id number] JENNIFER K LAMBERT  IMPRESSIONS   1. Left ventricular ejection fraction, by estimation, is 65 to 70%. The left ventricle has normal function. The left ventricle has no regional wall motion abnormalities. Left ventricular diastolic parameters were normal. 2. Right ventricular systolic function is normal. The right ventricular size is normal. 3. The mitral valve is normal in structure. Trivial mitral valve regurgitation. 4. The aortic valve is normal in structure. Aortic valve regurgitation is not visualized. 5. The inferior vena cava is normal in size with greater than 50% respiratory variability, suggesting right atrial pressure of 3 mmHg.  Comparison(s): The left ventricular function is unchanged.  FINDINGS Left Ventricle: Left ventricular ejection fraction, by estimation, is 65 to 70%. The left ventricle has normal function. The left ventricle has no regional wall motion abnormalities. The left ventricular internal cavity  size was normal in size. There is no left ventricular hypertrophy. Left ventricular diastolic parameters were normal.  Right Ventricle: The right ventricular size is normal. Right vetricular wall thickness was not assessed. Right ventricular systolic function is normal.  Left Atrium: Left atrial size was normal in size.  Right Atrium: Right atrial size was normal in size.  Pericardium: There is no evidence of pericardial effusion.  Mitral Valve: The mitral valve is normal in structure. Trivial mitral valve regurgitation.  Tricuspid Valve: The tricuspid valve is normal in structure. Tricuspid valve regurgitation is trivial.  Aortic Valve: The aortic valve is normal in structure. Aortic valve regurgitation is not visualized.  Pulmonic Valve: The pulmonic valve was normal in structure. Pulmonic valve regurgitation is mild.  Aorta: The aortic root and ascending aorta are structurally normal, with no evidence of dilitation.  Venous: The inferior vena cava is normal in size with greater than 50% respiratory variability, suggesting right atrial pressure of 3 mmHg.  IAS/Shunts: No atrial level shunt detected by color flow Doppler.   LEFT VENTRICLE PLAX 2D LVIDd:         4.60 cm   Diastology LVIDs:         2.90 cm   LV e' medial:    6.67 cm/s LV PW:         1.00 cm   LV E/e' medial:  7.5 LV IVS:        1.10 cm   LV e' lateral:   10.70 cm/s LVOT diam:     2.30 cm   LV E/e' lateral: 4.7 LV SV:         59 LV SV Index:   29 LVOT Area:     4.15 cm  3D Volume EF: 3D EF:        51 % LV EDV:       143 ml LV ESV:       70 ml LV SV:        72 ml  RIGHT VENTRICLE RV S prime:     9.77 cm/s TAPSE (M-mode): 1.0 cm  LEFT ATRIUM             Index        RIGHT ATRIUM           Index LA diam:        3.50 cm 1.73 cm/m   RA Area:     11.70 cm LA Vol (A2C):   48.6 ml 23.96 ml/m  RA Volume:   22.20 ml  10.94 ml/m LA Vol (A4C):   26.8 ml 13.21 ml/m LA  Biplane Vol: 36.5 ml 17.99 ml/m AORTIC  VALVE LVOT Vmax:   77.60 cm/s LVOT Vmean:  49.700 cm/s LVOT VTI:    0.143 m  AORTA Ao Root diam: 3.30 cm  MITRAL VALVE MV Area (PHT): 3.34 cm    SHUNTS MV Decel Time: 227 msec    Systemic VTI:  0.14 m MV E velocity: 49.80 cm/s  Systemic Diam: 2.30 cm MV A velocity: 65.70 cm/s MV E/A ratio:  0.76  Dietrich Pates MD Electronically signed by Dietrich Pates MD Signature Date/Time: 08/30/2021/4:18:34 PM    Final            Recent Labs: 05/30/2022: ALT 30; BUN 10; Creatinine, Ser 1.06; Hemoglobin 13.7; Platelets 265; Potassium 4.3; Sodium 140  Recent Lipid Panel    Component Value Date/Time   CHOL 130 02/19/2019 0905   TRIG 133 02/19/2019 0905   HDL 39 (L) 02/19/2019 0905   CHOLHDL 3.3 02/19/2019 0905   CHOLHDL 3.8 05/07/2018 1255   VLDL 24 03/29/2016 1023   LDLCALC 64 02/19/2019 0905   LDLCALC 103 (H) 05/07/2018 1255    History of Present Illness    77 year old male with a history of CAD s/p CABG x 4 (LIMA-LAD, SVG-RCA, SVG-ramus, SVG-OM) in 2002, atrial flutter s/p loop recorder, PE, hyperlipidemia, BPH, and OSA.  Cardiac catheterization in 2006 revealed patent grafts.  Myoview in 2016 and 2018 was negative for ischemia.  Carotid Dopplers in 2022 revealed 1 to 39% LICA stenosis.  He had neck surgery in January 2023 and subsequent developed a saddle PE with evidence of right heart strain.  He also had an episode of atrial fibrillation/atrial flutter at the time.  Echocardiogram in 08/2021 revealed EF 65 to 70%, normal LV function, no RWMA, normal RV systolic function, no significant valvular abnormalities.  He underwent ILR placement which showed no recurrence of atrial fibrillation or atrial flutter.  He completed 6 months of anticoagulation with Eliquis.  He was last seen in the office on 06/20/2022 by Dr. Lalla Brothers, EP, was doing well from a cardiac standpoint.  Eliquis was discontinued.  He presents today for follow-up.  Since his last visit he has done well from a cardiac  standpoint.  He notes mild dyspnea with significant exertion (this has been in the setting of severe deconditioning following his hospitalization last year).  Denies any chest pain, palpitations, edema, PND, orthopnea, weight gain.  He is physically active.  Overall, he reports feeling well.  Home Medications    Current Outpatient Medications  Medication Sig Dispense Refill   alfuzosin (UROXATRAL) 10 MG 24 hr tablet Take 10 mg by mouth daily.     aspirin EC 81 MG tablet Take 1 tablet (81 mg total) by mouth daily. Swallow whole. 90 tablet 3   atorvastatin (LIPITOR) 40 MG tablet Take 1 tablet (40 mg total) by mouth daily. OV NEEDED 90 tablet 2   B Complex-C (B-COMPLEX WITH VITAMIN C) tablet Take 1 tablet by mouth daily.     ezetimibe (ZETIA) 10 MG tablet TAKE 1 TABLET BY MOUTH EVERY DAY 90 tablet 3   fenofibrate 160 MG tablet TAKE 1 TABLET (160 MG TOTAL) BY MOUTH DAILY. *OFFICE VISIT NEEDED* 90 tablet 3   hydrocortisone 1 % ointment Apply 1 application topically as needed for itching.     nystatin (MYCOSTATIN/NYSTOP) powder Apply 1 g topically daily as needed (itching).     No current facility-administered medications for this visit.     Review of Systems    He denies  chest pain, palpitations, pnd, orthopnea, n, v, dizziness, syncope, edema, weight gain, or early satiety. All other systems reviewed and are otherwise negative except as noted above.   Physical Exam    VS:  BP 138/64   Pulse 60   Ht 5\' 10"  (1.778 m)   Wt 200 lb 6.4 oz (90.9 kg)   SpO2 98%   BMI 28.75 kg/m  GEN: Well nourished, well developed, in no acute distress. HEENT: normal. Neck: Supple, no JVD, carotid bruits, or masses. Cardiac: RRR, no murmurs, rubs, or gallops. No clubbing, cyanosis, edema.  Radials/DP/PT 2+ and equal bilaterally.  Respiratory:  Respirations regular and unlabored, clear to auscultation bilaterally. GI: Soft, nontender, nondistended, BS + x 4. MS: no deformity or atrophy. Skin: warm and dry,  no rash. Neuro:  Strength and sensation are intact. Psych: Normal affect.  Accessory Clinical Findings    ECG personally reviewed by me today - NSR, 60 bpm - no acute changes.   Lab Results  Component Value Date   WBC 6.6 05/30/2022   HGB 13.7 05/30/2022   HCT 39.9 05/30/2022   MCV 93.4 05/30/2022   PLT 265 05/30/2022   Lab Results  Component Value Date   CREATININE 1.06 05/30/2022   BUN 10 05/30/2022   NA 140 05/30/2022   K 4.3 05/30/2022   CL 108 05/30/2022   CO2 24 05/30/2022   Lab Results  Component Value Date   ALT 30 05/30/2022   AST 28 05/30/2022   ALKPHOS 44 05/30/2022   BILITOT 0.6 05/30/2022   Lab Results  Component Value Date   CHOL 130 02/19/2019   HDL 39 (L) 02/19/2019   LDLCALC 64 02/19/2019   TRIG 133 02/19/2019   CHOLHDL 3.3 02/19/2019    Lab Results  Component Value Date   HGBA1C 6.4 (H) 05/07/2018    Assessment & Plan   1. CAD: S/p CABG x 4 (LIMA-LAD, SVG-RCA, SVG-ramus, SVG-OM) in 2002. Cardiac catheterization in 2006 revealed patent grafts.  Myoview in 2016 and 2018 was negative for ischemia. Stable with no anginal symptoms. No indication for ischemic evaluation.  Aspirin, Lipitor, Zetia, fenofibrate.  2. Paroxysmal atrial fibrillation/atrial flutter: S/p loop recorder,  which has showed no recurrence of atrial fibrillation or atrial flutter.  No longer on anticoagulation.  Following with EP.  3. History of PE: Completed 6 months of anticoagulation.  This was provoked in the setting of surgery.  No recurrence.  4. Carotid artery stenosis: Carotid Dopplers in 2022 revealed 1 to 39% LICA stenosis.  Asymptomatic.  No indication for repeat study at this time.  Continue aspirin, Lipitor.  5. Hyperlipidemia: LDL was 62 in 09/2022. Monitored and managed per PCP. Continue Lipitor, Zetia, fenofibrate.   6. Disposition: Follow-up in 6 months with EP, follow-up in 1 year with Dr. Rennis Golden.       Joylene Grapes, NP 12/08/2022, 11:03 AM

## 2022-12-14 NOTE — Progress Notes (Signed)
[place] Loop Recorder 

## 2022-12-15 ENCOUNTER — Ambulatory Visit (INDEPENDENT_AMBULATORY_CARE_PROVIDER_SITE_OTHER): Payer: Medicare HMO

## 2022-12-15 DIAGNOSIS — I2510 Atherosclerotic heart disease of native coronary artery without angina pectoris: Secondary | ICD-10-CM | POA: Diagnosis not present

## 2022-12-15 LAB — CUP PACEART REMOTE DEVICE CHECK
Date Time Interrogation Session: 20240606020050
Implantable Pulse Generator Implant Date: 20230629
Pulse Gen Serial Number: 511010278

## 2023-01-05 NOTE — Progress Notes (Signed)
[facility] Summary Report / Loop Recorder 

## 2023-01-16 ENCOUNTER — Ambulatory Visit (INDEPENDENT_AMBULATORY_CARE_PROVIDER_SITE_OTHER): Payer: Medicare HMO

## 2023-01-16 DIAGNOSIS — I4892 Unspecified atrial flutter: Secondary | ICD-10-CM

## 2023-01-17 LAB — CUP PACEART REMOTE DEVICE CHECK
Date Time Interrogation Session: 20240707020056
Implantable Pulse Generator Implant Date: 20230629
Pulse Gen Serial Number: 511010278

## 2023-02-02 NOTE — Progress Notes (Signed)
[facility] Summary Report / Loop Recorder 

## 2023-02-16 ENCOUNTER — Ambulatory Visit (INDEPENDENT_AMBULATORY_CARE_PROVIDER_SITE_OTHER): Payer: Medicare HMO

## 2023-02-16 DIAGNOSIS — I4892 Unspecified atrial flutter: Secondary | ICD-10-CM

## 2023-03-20 ENCOUNTER — Ambulatory Visit: Payer: Medicare HMO

## 2023-03-20 DIAGNOSIS — I4892 Unspecified atrial flutter: Secondary | ICD-10-CM

## 2023-03-21 LAB — CUP PACEART REMOTE DEVICE CHECK
Date Time Interrogation Session: 20240907020030
Implantable Pulse Generator Implant Date: 20230629
Pulse Gen Serial Number: 511010278

## 2023-04-04 NOTE — Progress Notes (Signed)
Merlin Loop Recorder  

## 2023-04-06 NOTE — Progress Notes (Signed)
[employer]lin [employer]

## 2023-04-20 ENCOUNTER — Ambulatory Visit: Payer: Medicare HMO

## 2023-04-20 DIAGNOSIS — I4892 Unspecified atrial flutter: Secondary | ICD-10-CM

## 2023-04-20 LAB — CUP PACEART REMOTE DEVICE CHECK
Date Time Interrogation Session: 20241010020037
Implantable Pulse Generator Implant Date: 20230629
Pulse Gen Serial Number: 511010278

## 2023-05-04 NOTE — Progress Notes (Signed)
[facility] Summary Report / Loop Recorder 

## 2023-05-22 ENCOUNTER — Ambulatory Visit (INDEPENDENT_AMBULATORY_CARE_PROVIDER_SITE_OTHER): Payer: Medicare HMO

## 2023-05-22 DIAGNOSIS — I4892 Unspecified atrial flutter: Secondary | ICD-10-CM

## 2023-05-24 LAB — CUP PACEART REMOTE DEVICE CHECK
Date Time Interrogation Session: 20241110020219
Implantable Pulse Generator Implant Date: 20230629
Pulse Gen Serial Number: 511010278

## 2023-06-13 ENCOUNTER — Encounter: Payer: Self-pay | Admitting: Dermatology

## 2023-06-13 ENCOUNTER — Ambulatory Visit: Payer: Medicare HMO | Admitting: Dermatology

## 2023-06-13 VITALS — BP 108/69 | HR 68

## 2023-06-13 DIAGNOSIS — L853 Xerosis cutis: Secondary | ICD-10-CM | POA: Diagnosis not present

## 2023-06-13 DIAGNOSIS — L821 Other seborrheic keratosis: Secondary | ICD-10-CM | POA: Diagnosis not present

## 2023-06-13 DIAGNOSIS — Z5111 Encounter for antineoplastic chemotherapy: Secondary | ICD-10-CM

## 2023-06-13 DIAGNOSIS — L578 Other skin changes due to chronic exposure to nonionizing radiation: Secondary | ICD-10-CM

## 2023-06-13 DIAGNOSIS — L570 Actinic keratosis: Secondary | ICD-10-CM | POA: Diagnosis not present

## 2023-06-13 DIAGNOSIS — L219 Seborrheic dermatitis, unspecified: Secondary | ICD-10-CM | POA: Diagnosis not present

## 2023-06-13 DIAGNOSIS — W908XXA Exposure to other nonionizing radiation, initial encounter: Secondary | ICD-10-CM | POA: Diagnosis not present

## 2023-06-13 MED ORDER — HYDROCORTISONE 2.5 % EX CREA: TOPICAL_CREAM | Freq: Two times a day (BID) | CUTANEOUS | 11 refills | Status: AC | PRN

## 2023-06-13 MED ORDER — FLUOROURACIL 5 % EX CREA: TOPICAL_CREAM | Freq: Two times a day (BID) | CUTANEOUS | 1 refills | Status: DC

## 2023-06-13 MED ORDER — HYDROCORTISONE 1 % EX OINT: 1.0000 | TOPICAL_OINTMENT | CUTANEOUS | 1 refills | Status: AC | PRN

## 2023-06-13 NOTE — Progress Notes (Signed)
Follow-Up Visit   Subjective  Keith Phelps is a 77 y.o. male who presents for the following: AK  Patient present today for follow up visit for AK. Patient was last evaluated on 12/07/22. At that visit pt was treated with cryo therapy for multiple sites located on his Left Forearm, left hand, right forearm. Patient reports sxs are better. Patient denies medication changes.  The following portions of the chart were reviewed this encounter and updated as appropriate: medications, allergies, medical history  Review of Systems:  No other skin or systemic complaints except as noted in HPI or Assessment and Plan.  Objective  Well appearing patient in no apparent distress; mood and affect are within normal limits.  A focused examination was performed of the following areas: B/L Arms, face & scalp  Relevant exam findings are noted in the Assessment and Plan.  Left Dorsal Hand (3), Left Temple, Right Dorsal Hand, Right Temple Erythematous thin papules/macules with gritty scale.     Assessment & Plan  Keith Phelps presented with concerns about rough and dry skin, particularly on his shins, and a history of seborrheic dermatitis. He is currently using DHS zinc shampoo for his scalp with good results and has hydrocortisone cream for flare-ups. Physical examination revealed dry skin on the shins, greasy flakes, and erythema on the face, nose, eyelids, and ears, consistent with seborrheic dermatitis. He also has actinic keratosis on his arms and forehead. Treatment plans include hydrocortisone cream for seborrheic dermatitis, cryotherapy and Efudex for actinic keratosis, and daily moisturizing for dry skin.  1. Seborrheic Dermatitis - Assessment: Patient has seborrheic dermatitis with greasy flakes and erythema on the face, nose, eyelids, and ears. Currently using DHS zinc for the scalp with good results. - Plan: Prescribe Hydrocortisone 2.5% cream. Apply twice daily for 5 days, then stop.  Repeat as needed for flares, ensuring at least 2 weeks between treatments. Monitor for potential side effects including acne, hypertension, and an increase in intraocular pressure.  2. Actinic Keratosis and Actinic Damage - Assessment: Patient presents with actinic keratoses and sun damage on the arms and face. - Plan: Perform cryotherapy with liquid nitrogen on select lesions. Prescribe Efudex (5-fluorouracil) for field therapy, to be applied twice daily for 2 weeks on the face, nose, forehead, temples, ears, and forearms starting in January. Provide hydrocortisone ointment for post-treatment healing. Advise sun protection during treatment, including wearing a hat and long-sleeve shirt. Treatment may be discontinued after 1 week if the reaction is too brisk.  Actinic keratoses are precancerous spots that appear secondary to cumulative UV radiation exposure/sun exposure over time. They are chronic with expected duration over 1 year. A portion of actinic keratoses will progress to squamous cell carcinoma of the skin. It is not possible to reliably predict which spots will progress to skin cancer and so treatment is recommended to prevent development of skin cancer.  Recommend daily broad spectrum sunscreen SPF 30+ to sun-exposed areas, reapply every 2 hours as needed.  Recommend staying in the shade or wearing long sleeves, sun glasses (UVA+UVB protection) and wide brim hats (4-inch brim around the entire circumference of the hat). Call for new or changing lesions.  Treatment Plan: Start 5-fluorouracil cream twice a day for 14 days to affected areas including face and arms starting in January.  Reviewed course of treatment and expected reaction.  Patient advised to expect inflammation and crusting and advised that erosions are possible.  Patient advised to be diligent with sun protection during and after  treatment. Handout with details of how to apply medication and what to expect provided. Counseled to  keep medication out of reach of children and pets.  Reviewed course of treatment and expected reaction.  Patient advised to expect inflammation and crusting and advised that erosions are possible.  Patient advised to be diligent with sun protection during and after treatment. Handout with details of how to apply medication and what to expect provided. Counseled to keep medication out of reach of children and pets.   3. Xerosis - Assessment: Patient has dry skin, particularly on the shin area. - Plan: Continue with the current moisturizer (Jurgens brand). Apply moisturizer to the entire body daily, especially after showering. Emphasize the importance of consistent moisturizing, particularly during winter months.  Follow-up as needed for any unresolved or worsening issues.  ACTINIC Damage Exam: Erythematous thin papules/macules with gritty scale at the B/L Hands, face    Seborrheic keratosis  Related Medications hydrocortisone 2.5 % cream Apply topically 2 (two) times daily as needed (Rash). Apply to effected areas on face for 5 days then STOP  AK (actinic keratosis) (6) Left Dorsal Hand (3); Right Dorsal Hand; Left Temple; Right Temple  Related Procedures Destruction of lesion Complexity: simple   Destruction method: cryotherapy   Informed consent: discussed and consent obtained   Timeout:  patient name, date of birth, surgical site, and procedure verified Lesion destroyed using liquid nitrogen: Yes   Cryotherapy cycles:  6 Post-procedure details: wound care instructions given    Related Medications hydrocortisone 1 % ointment Apply 1 Application topically as needed for itching. Apply to areas treated with 5 FU cream  fluorouracil (EFUDEX) 5 % cream Apply topically 2 (two) times daily. Apply for 14 days then apply Hydrocortisone   SEBORRHEIC DERMATITIS Exam: Pink patches with greasy scale at scalp, nose, eyelids, and ears  Well controlled on scalp but flared at  nose,eyelids and ears  Seborrheic Dermatitis is a chronic persistent rash characterized by pinkness and scaling most commonly of the mid face but also can occur on the scalp (dandruff), ears; mid chest, mid back and groin.  It tends to be exacerbated by stress and cooler weather.  People who have neurologic disease may experience new onset or exacerbation of existing seborrheic dermatitis.  The condition is not curable but treatable and can be controlled.  Treatment Plan: - Recommended to continue using DHS Zinc Shampoo bi-weekly - We will prescribe Hydrocortisone cream 2.5 % to apply 2 times daily for 5 days the STOP    Return in about 5 months (around 11/11/2023) for TBSE.    Documentation: I have reviewed the above documentation for accuracy and completeness, and I agree with the above.  Stasia Cavalier, am acting as scribe for Langston Reusing, DO.  Langston Reusing, DO

## 2023-06-13 NOTE — Patient Instructions (Addendum)
Hello Mr. Keith Phelps,  Thank you for visiting my office today. I appreciate your dedication to enhancing your skin health. Below is a summary of the essential instructions from our discussion:  - Moisturizer: Continue using Jurgens moisturizer daily, especially during the winter months.  - Seborrheic Dermatitis Treatment:   - Medication: Apply hydrocortisone 2.5% cream to affected areas.   - Frequency: Use twice a day for 5 days during flare-ups.   - Interval: Ensure there is at least a 2-week interval between treatments.  - Efudex Treatment for Sun Damage and Precancers:   - Start Date: Begin treatment in January.   - Application: Apply Efudex cream to the nose, forehead, temples, ears, and forearms.   - Frequency: Use twice daily for 2 weeks.  - Post-Efudex Care:   - Use hydrocortisone 2.5% ointment to aid in healing after Efudex treatment.  - Sun Protection:   - Avoid direct sunlight on treated areas.   - Wear protective clothing and a hat when outdoors.  - Actinic Keratosis Treatment:   - Today, we will freeze some actinic keratosis spots using liquid nitrogen.  Please follow these instructions carefully. If you have any questions or concerns, feel free to reach out.  Best regards,  Dr. Langston Reusing [facility]  Cryotherapy Aftercare  Wash gently with soap and water everyday.   Apply Vaseline and Band-Aid daily until healed.   Important Information   Due to recent changes in healthcare laws, you may see results of your pathology and/or laboratory studies on MyChart before the doctors have had a chance to review them. We understand that in some cases there may be results that are confusing or concerning to you. Please understand that not all results are received at the same time and often the doctors may need to interpret multiple results in order to provide you with the best plan of care or course of treatment. Therefore, we ask that you please give [place] 2 business  days to thoroughly review all your results before contacting the office for clarification. Should we see a critical lab result, you will be contacted sooner.     If You Need Anything After Your Visit   If you have any questions or concerns for your doctor, please call our main line at [phone]. If no one answers, please leave a voicemail as directed and we will return your call as soon as possible. Messages left after 4 pm will be answered the following business day.    You may also send [place] a message via MyChart. We typically respond to MyChart messages within 1-2 business days.  For prescription refills, please ask your pharmacy to contact our office. Our fax number is [phone].  If you have an urgent issue when the clinic is closed that cannot wait until the next business day, you can page your doctor at the number below.     Please note that while we do our best to be available for urgent issues outside of office hours, we are not available 24/7.    If you have an urgent issue and are unable to reach [place], you may choose to seek medical care at your doctor's office, retail clinic, urgent care center, or emergency room.   If you have a medical emergency, please immediately call 911 or go to the emergency department. In the event of inclement weather, please call our main line at [phone] for an update on the status of any delays or closures.  Dermatology Medication Tips:  Please keep the boxes that topical medications come in in order to help keep track of the instructions about where and how to use these. Pharmacies typically print the medication instructions only on the boxes and not directly on the medication tubes.   If your medication is too expensive, please contact our office at [phone] or send [place] a message through MyChart.    We are unable to tell what your co-pay for medications will be in advance as this is different depending on your insurance coverage. However, we  may be able to find a substitute medication at lower cost or fill out paperwork to get insurance to cover a needed medication.    If a prior authorization is required to get your medication covered by your insurance company, please allow [place] 1-2 business days to complete this process.   Drug prices often vary depending on where the prescription is filled and some pharmacies may offer cheaper prices.   The website www.goodrx.com contains coupons for medications through different pharmacies. The prices here do not account for what the cost may be with help from insurance (it may be cheaper with your insurance), but the website can give you the price if you did not use any insurance.  - You can print the associated coupon and take it with your prescription to the pharmacy.  - You may also stop by our office during regular business hours and pick up a GoodRx coupon card.  - If you need your prescription sent electronically to a different pharmacy, notify our office through [facility] or by phone at [phone]

## 2023-06-14 ENCOUNTER — Telehealth: Payer: Self-pay

## 2023-06-14 NOTE — Telephone Encounter (Signed)
PA initiated Key: [id number]

## 2023-06-14 NOTE — Telephone Encounter (Signed)
PA approved   Per CMM:  Approved today by [facility] NCPDP 2017 Your request has been approved Authorization Expiration Date: 09/12/2023

## 2023-06-16 ENCOUNTER — Other Ambulatory Visit: Payer: Self-pay | Admitting: Cardiology

## 2023-06-19 ENCOUNTER — Other Ambulatory Visit: Payer: Self-pay

## 2023-06-19 DIAGNOSIS — L570 Actinic keratosis: Secondary | ICD-10-CM

## 2023-06-19 MED ORDER — FLUOROURACIL 5 % EX CREA: TOPICAL_CREAM | Freq: Two times a day (BID) | CUTANEOUS | 0 refills | Status: AC

## 2023-06-19 NOTE — Progress Notes (Signed)
Needed directions changed

## 2023-06-19 NOTE — Progress Notes (Signed)
Merlin Loop Recorder  

## 2023-06-22 ENCOUNTER — Ambulatory Visit (INDEPENDENT_AMBULATORY_CARE_PROVIDER_SITE_OTHER): Payer: Medicare HMO

## 2023-06-22 DIAGNOSIS — I4892 Unspecified atrial flutter: Secondary | ICD-10-CM | POA: Diagnosis not present

## 2023-06-22 LAB — CUP PACEART REMOTE DEVICE CHECK
Date Time Interrogation Session: 20241212020049
Implantable Pulse Generator Implant Date: 20230629
Pulse Gen Serial Number: 511010278

## 2023-06-25 NOTE — Progress Notes (Unsigned)
  Cardiology Office Note:  .   Date:  06/25/2023  ID:  Keith Phelps, DOB 08/10/1945, MRN [id number] PCP: Felipa Furnace, NP  [facility] Providers Cardiologist:  Chrystie Nose, MD Electrophysiologist:  Lanier Prude, MD {  History of Present Illness: .   Keith Phelps is a 77 y.o. male w/PMHx of AFlutter (in setting of acute DVT/PE > provoked during period of immobility post-op/back surgery), CAD (+ hx of MI, CABG 2002), HLD, OSA  Saw Dr Lalla Brothers 06/20/22, discussed an episode of sudden fatigue and weakness and presented to the ED 02/2022. This was in the setting of returning to his workout routines Had no arrhythmias since ILR implant, feeling well and denied any symptoms. Without provoked AFlutter episode and none further via loop, OAC stopped and advised ASA  Saw cardiology team 12/08/22, mild DOE chronic for a year or so, doing , no changes are made  Today's visit is scheduled as an annual visit  ROS:   He is doing quite well No CP, palpitations, or cardiac awareness No DOE, SOB No near syncope or syncope.  May need another back surgery in the future, following with neurosurgery  Device information Abbott ILR, implanted 01/06/22  Arrhythmia/AAD hx AFlutter noted during acute DVT/PE hospitalization jan 2023  Studies Reviewed: Marland Kitchen    EKG not done today  DEVICE interrogation done today and reviewed by myself Battery is good R wave is 0.6-0.7 No arrhythmias/episodes   08/30/21: TTE 1. Left ventricular ejection fraction, by estimation, is 65 to 70%. The  left ventricle has normal function. The left ventricle has no regional  wall motion abnormalities. Left ventricular diastolic parameters were  normal.   2. Right ventricular systolic function is normal. The right ventricular  size is normal.   3. The mitral valve is normal in structure. Trivial mitral valve  regurgitation.   4. The aortic valve is normal in structure. Aortic valve regurgitation is   not visualized.   5. The inferior vena cava is normal in size with greater than 50%  respiratory variability, suggesting right atrial pressure of 3 mmHg.   Comparison(s): The left ventricular function is unchanged.    Risk Assessment/Calculations:    Physical Exam:   VS:  There were no vitals taken for this visit.   Wt Readings from Last 3 Encounters:  12/08/22 200 lb 6.4 oz (90.9 kg)  06/20/22 205 lb (93 kg)  05/30/22 200 lb (90.7 kg)    GEN: Well nourished, well developed in no acute distress NECK: No JVD; No carotid bruits CARDIAC: RRR, no murmurs, rubs, gallops RESPIRATORY:  CTA b/l without rales, wheezing or rhonchi  ABDOMEN: Soft, non-tender, non-distended EXTREMITIES:  No edema; No deformity   ILR site: is stable, no thinning, fluctuation, tethering  ASSESSMENT AND PLAN: .    Aflutter Provoked as discussed above Off OAC zero burden by his ILR  CAD No symptoms of angina On ASA, statin/zetia C/w Dr. Coy Saunas       Dispo: remotes as usual, back in clinic in 23mo, sooner if needed  Signed, Judaea Burgoon Norberto Sorenson, PA-C

## 2023-06-27 ENCOUNTER — Ambulatory Visit: Payer: Medicare HMO | Attending: Physician Assistant | Admitting: Physician Assistant

## 2023-06-27 ENCOUNTER — Encounter: Payer: Medicare HMO | Admitting: Cardiology

## 2023-06-27 VITALS — BP 114/62 | HR 67 | Ht 70.0 in | Wt 198.4 lb

## 2023-06-27 DIAGNOSIS — I2510 Atherosclerotic heart disease of native coronary artery without angina pectoris: Secondary | ICD-10-CM | POA: Diagnosis not present

## 2023-06-27 DIAGNOSIS — Z95818 Presence of other cardiac implants and grafts: Secondary | ICD-10-CM | POA: Diagnosis not present

## 2023-06-27 DIAGNOSIS — I4892 Unspecified atrial flutter: Secondary | ICD-10-CM | POA: Diagnosis not present

## 2023-06-27 LAB — CUP PACEART INCLINIC DEVICE CHECK
Date Time Interrogation Session: 20241217173345
Implantable Pulse Generator Implant Date: 20230629
Pulse Gen Serial Number: 511010278

## 2023-06-27 NOTE — Patient Instructions (Signed)
Medication Instructions:   Your physician recommends that you continue on your current medications as directed. Please refer to the Current Medication list given to you today.  *If you need a refill on your cardiac medications before your next appointment, please call your pharmacy*   Lab Work:  [place]   If you have labs (blood work) drawn today and your tests are completely normal, you will receive your results only by: [place] (if you have MyChart) OR A paper copy in the mail If you have any lab test that is abnormal or we need to change your treatment, we will call you to review the results.   Testing/Procedures: NONE ORDERED  TODAY    Follow-Up: At [facility], you and your health needs are our priority.  As part of our continuing mission to provide you with exceptional heart care, we have created designated Provider Care Teams.  These Care Teams include your primary Cardiologist (physician) and Advanced Practice Providers (APPs -  Physician Assistants and Nurse Practitioners) who all work together to provide you with the care you need, when you need it.  We recommend signing up for the patient portal called "MyChart".  Sign up information is provided on this After Visit Summary.  MyChart is used to connect with patients for Virtual Visits (Telemedicine).  Patients are able to view lab/test results, encounter notes, upcoming appointments, etc.  Non-urgent messages can be sent to your provider as well.   To learn more about what you can do with MyChart, go to [email or web address].    Your next appointment:   6 month(s)  Provider:   You may see Vickie Epley, MD or one of the following Advanced Practice Providers on your designated Care Team:   Tommye Standard, [place]  Other Instructions

## 2023-06-29 ENCOUNTER — Other Ambulatory Visit: Payer: Self-pay | Admitting: Neurosurgery

## 2023-06-29 ENCOUNTER — Other Ambulatory Visit (HOSPITAL_COMMUNITY): Payer: Self-pay | Admitting: Neurosurgery

## 2023-06-29 DIAGNOSIS — R482 Apraxia: Secondary | ICD-10-CM

## 2023-06-29 DIAGNOSIS — M4712 Other spondylosis with myelopathy, cervical region: Secondary | ICD-10-CM

## 2023-07-10 ENCOUNTER — Ambulatory Visit: Payer: Medicare HMO

## 2023-07-10 DIAGNOSIS — M4712 Other spondylosis with myelopathy, cervical region: Secondary | ICD-10-CM | POA: Diagnosis not present

## 2023-07-10 DIAGNOSIS — R482 Apraxia: Secondary | ICD-10-CM

## 2023-07-10 DIAGNOSIS — R531 Weakness: Secondary | ICD-10-CM | POA: Diagnosis not present

## 2023-07-24 ENCOUNTER — Ambulatory Visit (INDEPENDENT_AMBULATORY_CARE_PROVIDER_SITE_OTHER): Payer: Medicare HMO

## 2023-07-24 DIAGNOSIS — I4892 Unspecified atrial flutter: Secondary | ICD-10-CM

## 2023-07-25 LAB — CUP PACEART REMOTE DEVICE CHECK
Date Time Interrogation Session: 20250113080045
Implantable Pulse Generator Implant Date: 20230629
Pulse Gen Serial Number: 511010278

## 2023-07-28 NOTE — Progress Notes (Signed)
[facility] Summary Report / Loop Recorder 

## 2023-07-30 ENCOUNTER — Encounter: Payer: Self-pay | Admitting: Cardiology

## 2023-08-24 ENCOUNTER — Ambulatory Visit (INDEPENDENT_AMBULATORY_CARE_PROVIDER_SITE_OTHER): Payer: Medicare HMO

## 2023-08-24 DIAGNOSIS — I4892 Unspecified atrial flutter: Secondary | ICD-10-CM | POA: Diagnosis not present

## 2023-08-28 LAB — CUP PACEART REMOTE DEVICE CHECK
Date Time Interrogation Session: 20250213080107
Implantable Pulse Generator Implant Date: 20230629
Pulse Gen Serial Number: 511010278

## 2023-08-30 ENCOUNTER — Encounter: Payer: Self-pay | Admitting: Cardiology

## 2023-09-03 ENCOUNTER — Encounter (HOSPITAL_BASED_OUTPATIENT_CLINIC_OR_DEPARTMENT_OTHER): Payer: Self-pay | Admitting: Emergency Medicine

## 2023-09-03 ENCOUNTER — Emergency Department (HOSPITAL_BASED_OUTPATIENT_CLINIC_OR_DEPARTMENT_OTHER)
Admission: EM | Admit: 2023-09-03 | Discharge: 2023-09-03 | Disposition: A | Discharge status: Home / Self Care | Payer: Medicare HMO | Attending: Emergency Medicine | Admitting: Emergency Medicine

## 2023-09-03 ENCOUNTER — Other Ambulatory Visit: Payer: Self-pay

## 2023-09-03 ENCOUNTER — Emergency Department (HOSPITAL_BASED_OUTPATIENT_CLINIC_OR_DEPARTMENT_OTHER): Payer: Medicare HMO

## 2023-09-03 DIAGNOSIS — S92521A Displaced fracture of medial phalanx of right lesser toe(s), initial encounter for closed fracture: Secondary | ICD-10-CM

## 2023-09-03 DIAGNOSIS — Z79899 Other long term (current) drug therapy: Secondary | ICD-10-CM | POA: Diagnosis not present

## 2023-09-03 DIAGNOSIS — I10 Essential (primary) hypertension: Secondary | ICD-10-CM | POA: Diagnosis not present

## 2023-09-03 DIAGNOSIS — Z7982 Long term (current) use of aspirin: Secondary | ICD-10-CM | POA: Insufficient documentation

## 2023-09-03 DIAGNOSIS — W231XXA Caught, crushed, jammed, or pinched between stationary objects, initial encounter: Secondary | ICD-10-CM | POA: Diagnosis not present

## 2023-09-03 DIAGNOSIS — S92522A Displaced fracture of medial phalanx of left lesser toe(s), initial encounter for closed fracture: Secondary | ICD-10-CM | POA: Diagnosis not present

## 2023-09-03 DIAGNOSIS — M79674 Pain in right toe(s): Secondary | ICD-10-CM | POA: Diagnosis present

## 2023-09-03 NOTE — Discharge Instructions (Addendum)
 You have a fracture of your right third toe.  Please continue buddy taping your toes at home as shown here.  I have included the x-ray read below.  Please follow-up with the orthopedic provider listed below, or an orthopedic provider of your preference, within the next 1 to 2 weeks.  You may take up to 1000mg  of tylenol every 6 hours as needed for pain.  Do not take more then 4g per day.  You may use up to 600mg  ibuprofen every 6 hours as needed for pain.  Do not exceed 2.4g of ibuprofen per day.  Please return to the ER if you have any uncontrolled pain, numbness in your foot, any other new or concerning symptoms.  "IMPRESSION: 1. Comminuted intra-articular fracture of the third middle phalanx, with fracture lines extending into the proximal interphalangeal joint. 2. 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

## 2023-09-03 NOTE — ED Triage Notes (Signed)
 Pt fell today, RT foot got caught on a shoe rack; c/o pain to RT 3rd toe

## 2023-09-03 NOTE — ED Provider Notes (Signed)
 [place] [facility] Provider Note   CSN: [id number] Arrival date & time: 09/03/23  1611     History  Chief Complaint  Patient presents with   Marletta Lor    Keith Phelps is a 78 y.o. male with history of A-fib, PE, hypertension, presents with concern for a fall that occurred earlier today after he caught his toe on a shoe rack.  He fell to the ground, but did not hit his head.  Denies any loss of consciousness.  He reported pain to his right third toe immediately after this happened and pain in the right third toe when walking.  Denies pain elsewhere.   Fall       Home Medications Prior to Admission medications   Medication Sig Start Date End Date Taking? Authorizing Provider  alfuzosin (UROXATRAL) 10 MG 24 hr tablet Take 10 mg by mouth daily. 12/25/20   [provider]  aspirin EC 81 MG tablet Take 1 tablet (81 mg total) by mouth daily. Swallow whole. 06/20/22   Lanier Prude, MD  atorvastatin (LIPITOR) 40 MG tablet TAKE 1 TABLET (40 MG TOTAL) BY MOUTH DAILY. OV NEEDED 06/19/23   Swinyer, Zachary George, NP  B Complex-C (B-COMPLEX WITH VITAMIN C) tablet Take 1 tablet by mouth daily. 08/18/00   [provider]  ezetimibe (ZETIA) 10 MG tablet TAKE 1 TABLET BY MOUTH EVERY DAY 09/19/22   Lanier Prude, MD  fenofibrate 160 MG tablet TAKE 1 TABLET (160 MG TOTAL) BY MOUTH DAILY. *OFFICE VISIT NEEDED* 09/19/22   Lanier Prude, MD  fluorouracil (EFUDEX) 5 % cream Apply topically 2 (two) times daily. Apply to affected area bid for 14 days then apply Hydrocortisone 06/19/23   Terri Piedra, DO  hydrocortisone 1 % ointment Apply 1 Application topically as needed for itching. Apply to areas treated with 5 FU cream 06/13/23   Terri Piedra, DO  hydrocortisone 2.5 % cream Apply topically 2 (two) times daily as needed (Rash). Apply to effected areas on face for 5 days then STOP 06/13/23   Terri Piedra, DO  nystatin  (MYCOSTATIN/NYSTOP) powder Apply 1 g topically daily as needed (itching).    [provider]      Allergies    Doxycycline    Review of Systems   Review of Systems  Musculoskeletal:        Right 3rd toe pain    Physical Exam Updated Vital Signs BP 134/68 (BP Location: Left Arm)   Pulse 68   Temp 98.2 F (36.8 C)   Resp 16   Ht 5\' 10"  (1.778 m)   Wt 90 kg   SpO2 97%   BMI 28.47 kg/m  Physical Exam Vitals and nursing note reviewed.  Constitutional:      Appearance: Normal appearance.  HENT:     Head: Normocephalic and atraumatic.  Cardiovascular:     Comments: 2+ dorsalis pedis pulses bilaterally Pulmonary:     Effort: Pulmonary effort is normal.  Musculoskeletal:     Comments: Right lower extremity:  General No obvious deformity. No erythema, edema, contusions, open wounds   Palpation Tender to palpation over the right 3rd toe PIP joint. Non tender of the 3rd toe distal phalanx or proximal phalanx.   No tenderness of the knee.  Nontender along the tibia and fibula.  No tenderness palpation of the lateral medial malleolus.  No tenderness palpation of the 1st through 5th metatarsals.  No tenderness palpation of  the first, second, fourth, or fifth phalanges of the right foot.   ROM Full knee flexion and extension, Full plantarflexion and dorsiflexion. Able to wiggle toes without difficulty  Sensation: Sensation intact throughout the lower extremity  Strength: 5/5 strength with resisted knee flexion and extension  5/5 strength with resisted ankle plantarflexion and dorsiflexion   Neurological:     General: No focal deficit present.     Mental Status: He is alert.  Psychiatric:        Mood and Affect: Mood normal.        Behavior: Behavior normal.     ED Results / Procedures / Treatments   Labs (all labs ordered are listed, but only abnormal results are displayed) Labs Reviewed - No data to display  EKG None  Radiology DG Foot Complete  Right Result Date: 09/03/2023 CLINICAL DATA:  Larey Seat, right foot and third digit pain EXAM: RIGHT FOOT COMPLETE - 3+ VIEW COMPARISON:  None Available. FINDINGS: Frontal, oblique, and lateral views of the right foot are obtained. Evaluation is limited due to suboptimal technique. There is a comminuted intra-articular fracture of the third middle phalanx, with fracture lines extending into the proximal interphalangeal joint best seen on the frontal projection. No other acute bony abnormalities. There is mild multifocal osteoarthritis, greatest at the first metatarsophalangeal joint. Soft tissues are unremarkable. IMPRESSION: 1. Comminuted intra-articular fracture of the third middle phalanx, with fracture lines extending into the proximal interphalangeal joint. 2. 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 Electronically Signed   By: Sharlet Salina M.D.   On: 09/03/2023 17:13    Procedures Procedures    Medications Ordered in ED Medications - No data to display  ED Course/ Medical Decision Making/ A&P                                 Medical Decision Making Amount and/or Complexity of Data Reviewed Radiology: ordered.     Differential diagnosis includes but is not limited to fracture, dislocation, sprain  ED Course:  Patient well-appearing, stable vital signs.  He reports pain to his right third toe after a fall earlier today.  Has good story for mechanical fall, no concern for other fall etiology at this time. He did not hit his head. Tender to palpation of the right third toe middle phalanx and PIP joint.  No tenderness elsewhere in the right lower extremity diffusely.  Denies pain anywhere else in his body.  Neurovascularly intact in the right lower extremity.  X-ray showed comminuted intra-articular fracture of the right third middle phalanx, with fracture extending into the PIP joint. I discussed these x-ray findings with the patient.  Discussed that we can  buddy tape toes, used a postop shoe, or cam boot.  I did recommend against cam boot as we discussed how these can be clunky and cause further falls.  Patient opted for buddy taping his toes.  This was performed by paramedic.  Patient tolerated this well. Patient declines any pain medication here.  He declines any further pain medication for at home.  States he will take Tylenol and ibuprofen if needed.   Impression: Comminuted intra-articular fracture of the right foot third middle phalanx extending into the PIP joint  Disposition:  The patient was discharged home with instructions to buddy tape his toes as shown here.  Follow-up with orthopedics in the next 1 to 2 weeks.  Their contact information was provided  on his discharge paperwork.  Tylenol and ibuprofen as needed for pain. Return precautions given.  Imaging Studies ordered: I ordered imaging studies including x-ray right foot I independently visualized the imaging with scope of interpretation limited to determining acute life threatening conditions related to emergency care. Imaging showed comminuted intra-articular fracture of the right foot third middle phalanx with fracture lines extending into the proximal interphalangeal joint I agree with the radiologist interpretation              Final Clinical Impression(s) / ED Diagnoses Final diagnoses:  Closed displaced fracture of middle phalanx of lesser toe of right foot, initial encounter    Rx / DC Orders ED Discharge Orders     None         Arabella Merles, Cordelia Poche 09/03/23 1749    Linwood Dibbles, MD 09/04/23 [phone]

## 2023-09-06 NOTE — Progress Notes (Signed)
 [facility] [address] [employer]

## 2023-09-06 NOTE — Addendum Note (Signed)
 Addended by: Geralyn Flash D on: 09/06/2023 10:27 AM   Modules accepted: Orders

## 2023-09-08 ENCOUNTER — Other Ambulatory Visit: Payer: Self-pay | Admitting: Cardiology

## 2023-09-25 ENCOUNTER — Ambulatory Visit (INDEPENDENT_AMBULATORY_CARE_PROVIDER_SITE_OTHER): Payer: Medicare HMO

## 2023-09-25 DIAGNOSIS — I4892 Unspecified atrial flutter: Secondary | ICD-10-CM | POA: Diagnosis not present

## 2023-09-25 LAB — CUP PACEART REMOTE DEVICE CHECK
Date Time Interrogation Session: 20250317070308
Implantable Pulse Generator Implant Date: 20230629
Pulse Gen Model: 5000
Pulse Gen Serial Number: 511010278

## 2023-09-26 ENCOUNTER — Encounter: Payer: Self-pay | Admitting: Cardiology

## 2023-09-27 NOTE — Addendum Note (Signed)
 Addended by: Elease Etienne A on: 09/27/2023 09:40 AM   Modules accepted: Orders

## 2023-09-27 NOTE — Progress Notes (Signed)
 [facility] Summary Report / Loop Recorder

## 2023-10-26 ENCOUNTER — Ambulatory Visit (INDEPENDENT_AMBULATORY_CARE_PROVIDER_SITE_OTHER): Payer: Medicare HMO

## 2023-10-26 DIAGNOSIS — I4892 Unspecified atrial flutter: Secondary | ICD-10-CM | POA: Diagnosis not present

## 2023-10-28 LAB — CUP PACEART REMOTE DEVICE CHECK
Date Time Interrogation Session: 20250417070031
Implantable Pulse Generator Implant Date: 20230629
Pulse Gen Model: 5000
Pulse Gen Serial Number: 511010278

## 2023-10-31 ENCOUNTER — Encounter: Payer: Self-pay | Admitting: Cardiology

## 2023-11-14 ENCOUNTER — Ambulatory Visit: Payer: Medicare HMO | Admitting: Dermatology

## 2023-11-15 NOTE — Progress Notes (Signed)
 [facility] [address] [employer]

## 2023-11-27 ENCOUNTER — Ambulatory Visit: Payer: Medicare HMO

## 2023-11-27 DIAGNOSIS — I4892 Unspecified atrial flutter: Secondary | ICD-10-CM

## 2023-11-27 LAB — CUP PACEART REMOTE DEVICE CHECK
Date Time Interrogation Session: 20250519050419
Implantable Pulse Generator Implant Date: 20230629
Pulse Gen Model: 5000
Pulse Gen Serial Number: 511010278

## 2023-11-28 ENCOUNTER — Ambulatory Visit: Payer: Self-pay | Admitting: Cardiology

## 2023-11-29 ENCOUNTER — Other Ambulatory Visit: Payer: Self-pay | Admitting: Internal Medicine

## 2023-12-05 ENCOUNTER — Ambulatory Visit: Payer: Medicare HMO | Attending: Internal Medicine | Admitting: Internal Medicine

## 2023-12-05 ENCOUNTER — Encounter: Payer: Self-pay | Admitting: Internal Medicine

## 2023-12-05 VITALS — BP 114/60 | HR 63 | Ht 70.0 in | Wt 196.6 lb

## 2023-12-05 DIAGNOSIS — I6523 Occlusion and stenosis of bilateral carotid arteries: Secondary | ICD-10-CM

## 2023-12-05 DIAGNOSIS — Z951 Presence of aortocoronary bypass graft: Secondary | ICD-10-CM

## 2023-12-05 DIAGNOSIS — E785 Hyperlipidemia, unspecified: Secondary | ICD-10-CM

## 2023-12-05 DIAGNOSIS — I2510 Atherosclerotic heart disease of native coronary artery without angina pectoris: Secondary | ICD-10-CM

## 2023-12-05 DIAGNOSIS — I4892 Unspecified atrial flutter: Secondary | ICD-10-CM

## 2023-12-05 DIAGNOSIS — Z86711 Personal history of pulmonary embolism: Secondary | ICD-10-CM

## 2023-12-05 NOTE — Patient Instructions (Signed)
 Medication Instructions:  Your physician recommends that you continue on your current medications as directed. Please refer to the Current Medication list given to you today.  *If you need a refill on your cardiac medications before your next appointment, please call your pharmacy*  Follow-Up: At [facility], you and your health needs are our priority.  As part of our continuing mission to provide you with exceptional heart care, our providers are all part of one team.  This team includes your primary Cardiologist (physician) and Advanced Practice Providers or APPs (Physician Assistants and Nurse Practitioners) who all work together to provide you with the care you need, when you need it.  Your next appointment:   In 6 months with Dr. Maximo Spar

## 2023-12-05 NOTE — Progress Notes (Signed)
 OFFICE NOTE  Chief Complaint:  Follow-up  Primary Care Physician: [place]  HPI:  Keith Phelps is a 78 year old gentleman with history of coronary disease and CABG in 2002, LIMA to the LAD, SVG to RCA and SVG to ramus and an SVG to the OM. His angiogram in 2006 showed patent vein grafts. He has done well without any symptoms. He is able to exercise without any chest pain or worsening shortness of breath. 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 He also has dyslipidemia and hypertension.  He was recently diagnosed with borderline diabetes now diet controlled. He has made major changes in his diet and exercise to combat this. He reports over the last year he said problems with his shoulders and had arthroscopic surgery on the right shoulder. He been undergoing rehabilitation on and off. He has recently been missing some doses of his TriCor . We recently obtained a lipid profile that showed his total cholesterol 154, triglycerides 147, HDL 40 and LDL 85.  I saw Keith Phelps back in the office today. He is currently without complaints. Unfortunately he's gained some weight and stopped doing so his exercise. He denies any chest pain or significant worsening shortness of breath. His last nuclear stress test was in 2011 and his bypass was in 2002. He's had good cholesterol control and recent laboratory work continues to show his cholesterol is at goal. Unfortunately his A1c is borderline elevated at 5.9 with a fasting glucose of 123. The rest of his laboratory work is within normal limits.  Keith Phelps returns today for follow-up. Overall he is doing well, however he has gained some additional weight. Today he is 205 pounds. Most of this is abdominal weight. He reports less exercise. 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 He does some short sprint type walking but has significantly decreased his  exercise. His last cholesterol testing was well controlled in August however A1c continues to creep up. He is not currently on any oral antidiabetic medications. Blood pressures controlled today. He denies chest pain or shortness of breath.  11/25/2016  Keith Phelps returns today for follow-up. Overall is done well the past year. Unfortunate is continued to gain some weight. He continues to play a lot of gaming and spends time on his box. He's not been very active. He has worsening abdominal girth and is actually noted to have a ventral hernia. He's apparently scheduled to see surgery for evaluation and possible repair. Hemoglobin A1c seems to have around 5.9. His cholesterol is still mildly elevated but fairly well-controlled. He denies any chest pain or worsening shortness of breath.  11/27/2017  Keith Phelps is seen today for follow-up.  This is an annual visit.  He seems to be doing well.  He is in the process of moving to a retirement community.  He is less active than he used to be.  He had a ventral hernia which was repaired.  He denies any chest pain or shortness of breath.  He is overdue for a lipid profile.  EKG today shows sinus bradycardia at 57.  He is on atorvastatin  and fenofibrate .   02/25/2019  Keith Phelps returns today for follow-up.  He is continued to do really well.  He lives at [facility] and says that he enjoys his lifestyle there.  He used to be able to go to the gym however  now just walks outside.  After adding ezetimibe  to his labs please report of marked improvement in his lipid profile. Labs from a week ago showed total cholesterol 130, triglycerides 133, HDL 39 and LDL 64.  02/27/2019  Keith Phelps is seen today in follow-up.  Overall he continues to do well.  He has had significant improvement in his lipids after adding the ezetimibe .  Most recent labs show total cholesterol 111, triglycerides 111, HDL 40 and LDL 49.  He denies any chest pain or shortness of breath.  He remains  physically active.  02/23/2021  Keith Phelps is seen today in follow-up.  He reports recently he has had some neuromuscular symptoms.  He noted some jerking motion in his left arm.  Ultimately underwent some physical therapy for this but did not have a neurologic evaluation.  He is also had some unusual symptoms in both the right arm and both legs.  He is scheduled to see a neurologist for this but cannot get appointment until September.  He denies any chest pain or shortness of breath.  His lab work seems to be very well controlled.  Particularly his lipid profile showed total cholesterol 110, triglycerides 63, HDL 42 and LDL 55.  A1c is 6.5%.  A1c is 6.5%.  His metabolic profile is essentially normal except for elevated glucose.  05/23/2022  Keith Phelps returns today for follow-up.  He has had a significantly eventful year.  He had neck surgery and subsequently developed a saddle pulmonary embolus.  He nearly died from this and had some degree of strain of the heart including an NSTEMI, however his echo performed in February showed normal biventricular function.  Overall he seems to have recovered from this although it was a slow recovery.  He did have some atrial fibrillation which was new and thought to be related to his PE.  As he was scheduled only to have 6 months of anticoagulation, there was a plan device to place a loop recorder to see if he was having any more atrial fibrillation.  That was successfully placed by Dr. Marven Slimmer and he has follow-up with Mr. Ulbrich in December.  It will be decided at that time as to whether or not he needs to continue on long-term anticoagulation.  12/05/2023  Keith Phelps returns today for follow-up.  Overall he seems to be improving.  He is been undergoing therapy based on the number of problems he had described above at his last visit.  He has had follow-up with EP and they note that he has had no recurrence of atrial fibrillation or flutter.  He has been taken off of  anticoagulation.  He seems to be recovering well.  He had an episode of fatigue several months ago however that dissipated.  I wonder if this was an episode of A-fib but his EKG today shows normal sinus rhythm.  He also broke a toe in February.  Recent lipid profile showed total cholesterol 113, triglycerides 89, HDL 41 and LDL 55.  PMHx:  Past Medical History:  Diagnosis Date   Actinic keratosis    Atrial fibrillation (HCC)    BPH (benign prostatic hyperplasia)    Coronary artery disease    Dermatitis    saborah   Hyperlipidemia    MI (myocardial infarction) (HCC)    Saddle embolism of pulmonary artery (HCC)    Sleep apnea    Umbilical hernia     Past Surgical History:  Procedure Laterality Date   APPENDECTOMY  CARDIAC CATHETERIZATION  06/13/2005   [facility]  EF 55%   CHOLECYSTECTOMY     CORONARY ARTERY BYPASS GRAFT  07/18/2000   x 4   DOPPLER ECHOCARDIOGRAPHY  02/21/2012   EF > 55%   DOPPLER ECHOCARDIOGRAPHY  03/16/2010   EF 45-50%   INSERTION OF MESH N/A 01/04/2017   Procedure: INSERTION OF MESH;  Surgeon: Adalberto Hollow, MD;  Location: [facility];  Service: General;  Laterality: N/A;   NM MYOVIEW  LTD  03/16/2010   EF 64%  Low risk study   right shoulder arthroscopy  2015   UMBILICAL HERNIA REPAIR N/A 01/04/2017   Procedure: REPAIR CHRONICALLY INCARCERATED UMBILICAL HERNIA;  Surgeon: Adalberto Hollow, MD;  Location: [facility];  Service: General;  Laterality: N/A;    FAMHx:  Family History  Problem Relation Age of Onset   Stroke Father    Heart disease Father     SOCHx:   reports that he has never smoked. He has never been exposed to tobacco smoke. He has never used smokeless tobacco. He reports that he does not drink alcohol and does not use drugs.  ALLERGIES:  Allergies  Allergen Reactions   Doxycycline Itching, Rash and Other (See Comments)    fever    ROS: Pertinent items noted in HPI and remainder of comprehensive ROS otherwise negative.  HOME  MEDS: Current Outpatient Medications  Medication Sig Dispense Refill   alfuzosin (UROXATRAL) 10 MG 24 hr tablet Take 10 mg by mouth daily.     aspirin  EC 81 MG tablet Take 1 tablet (81 mg total) by mouth daily. Swallow whole. 90 tablet 3   atorvastatin  (LIPITOR) 40 MG tablet TAKE 1 TABLET (40 MG TOTAL) BY MOUTH DAILY. OV NEEDED 90 tablet 1   B Complex-C (B-COMPLEX WITH VITAMIN C) tablet Take 1 tablet by mouth daily.     ezetimibe  (ZETIA ) 10 MG tablet Take 1 tablet (10 mg total) by mouth daily. Please keep scheduled appointment for future refills. Thank you. 30 tablet 0   fenofibrate  160 MG tablet Take 1 tablet (160 mg total) by mouth daily. Please keep scheduled appointment for future refills. Thank you. 30 tablet 0   fluorouracil  (EFUDEX ) 5 % cream Apply topically 2 (two) times daily. Apply to affected area bid for 14 days then apply Hydrocortisone  40 g 0   hydrocortisone  1 % ointment Apply 1 Application topically as needed for itching. Apply to areas treated with 5 FU cream 56 g 1   hydrocortisone  2.5 % cream Apply topically 2 (two) times daily as needed (Rash). Apply to effected areas on face for 5 days then STOP 30 g 11   nystatin (MYCOSTATIN/NYSTOP) powder Apply 1 g topically daily as needed (itching).     No current facility-administered medications for this visit.    LABS/IMAGING: No results found for this or any previous visit (from the past 48 hours). No results found.  VITALS: BP 114/60 (BP Location: Left Arm, Patient Position: Sitting)   Pulse 63   Ht 5\' 10"  (1.778 m)   Wt 196 lb 9.6 oz (89.2 kg)   SpO2 96%   BMI 28.21 kg/m   EXAM: General appearance: alert, no distress and mildly obese Neck: no carotid bruit and no JVD Lungs: clear to auscultation bilaterally Heart: regular rate and rhythm, S1, S2 normal, no murmur, click, rub or gallop Abdomen: soft, non-tender; bowel sounds normal; no masses,  no organomegaly Extremities: extremities normal, atraumatic, no cyanosis  or edema Pulses: 2+ and symmetric Skin:  Skin color, texture, turgor normal. No rashes or lesions Neurologic: Grossly normal Psych: pleasant  EKG: EKG Interpretation Date/Time:  Tuesday Dec 05 2023 09:44:01 EDT Ventricular Rate:  63 PR Interval:  192 QRS Duration:  90 QT Interval:  416 QTC Calculation: 425 R Axis:   -1  Text Interpretation: Normal sinus rhythm Normal ECG When compared with ECG of 25-Feb-2022 09:09, No significant change since last tracing Confirmed by Dinah Franco [phone]) on 12/05/2023 9:58:37 AM    ASSESSMENT: Coronary artery disease status post four-vessel CABG in 2002 Low risk Myoview  in 09/2014 and 12/2016  Dyslipidemia, goal LDL less than 70. Hypertension Borderline diabetes-diet controlled Postoperative saddle pulmonary embolus Paroxysmal atrial flutter, possibly related to PE, status post ILR  PLAN: 1.   Mr. Isidore seems to be doing better and is recovering after multiple life-threatening events back in 2023.  Although he had paroxysmal atrial flutter was likely thought to be related to his pulmonary embolus and is no longer anticoagulated.  His lipids have been well treated and he is at LDL less than 70.  Blood pressure is at goal.  He continues to improve with therapy.  No further adjustments at this time.  Follow-up in 6 months or sooner as necessary.  Hazle Lites, MD, [facility], FNLA, FACP  [place]  [facility]  Medical Director of the Advanced Lipid Disorders &  Cardiovascular Risk Reduction Clinic Diplomate of the American Board of Clinical Lipidology Attending Cardiologist  Direct Dial: [phone]  Fax: [phone]  Website:  [email or web address] Dosha Broshears 12/05/2023, 9:59 AM

## 2023-12-06 NOTE — Progress Notes (Signed)
 [facility] Summary Report / Loop Recorder

## 2023-12-06 NOTE — Addendum Note (Signed)
 Addended by: Lott Rouleau A on: 12/06/2023 11:43 AM   Modules accepted: Orders

## 2023-12-07 ENCOUNTER — Other Ambulatory Visit: Payer: Self-pay | Admitting: Nurse Practitioner

## 2023-12-28 ENCOUNTER — Ambulatory Visit (INDEPENDENT_AMBULATORY_CARE_PROVIDER_SITE_OTHER): Payer: Medicare HMO

## 2023-12-28 DIAGNOSIS — I4892 Unspecified atrial flutter: Secondary | ICD-10-CM

## 2023-12-28 LAB — CUP PACEART REMOTE DEVICE CHECK
Date Time Interrogation Session: 20250619050107
Implantable Pulse Generator Implant Date: 20230629
Pulse Gen Model: 5000
Pulse Gen Serial Number: 511010278

## 2023-12-29 ENCOUNTER — Ambulatory Visit: Payer: Self-pay | Admitting: Cardiology

## 2023-12-30 ENCOUNTER — Other Ambulatory Visit: Payer: Self-pay | Admitting: Internal Medicine

## 2024-01-15 ENCOUNTER — Other Ambulatory Visit: Payer: Self-pay | Admitting: Urology

## 2024-01-15 DIAGNOSIS — R972 Elevated prostate specific antigen [PSA]: Secondary | ICD-10-CM

## 2024-01-16 ENCOUNTER — Encounter: Payer: Self-pay | Admitting: Urology

## 2024-01-18 NOTE — Progress Notes (Signed)
 [facility] [address] [employer]

## 2024-01-29 ENCOUNTER — Ambulatory Visit: Payer: Medicare HMO

## 2024-01-29 DIAGNOSIS — I4892 Unspecified atrial flutter: Secondary | ICD-10-CM

## 2024-01-31 LAB — CUP PACEART REMOTE DEVICE CHECK
Date Time Interrogation Session: 20250721050418
Implantable Pulse Generator Implant Date: 20230629
Pulse Gen Model: 5000
Pulse Gen Serial Number: 511010278

## 2024-02-01 ENCOUNTER — Ambulatory Visit: Payer: Self-pay | Admitting: Cardiology

## 2024-02-07 ENCOUNTER — Ambulatory Visit: Admitting: Dermatology

## 2024-02-14 ENCOUNTER — Encounter: Payer: Self-pay | Admitting: Urology

## 2024-02-17 ENCOUNTER — Ambulatory Visit
Admission: RE | Admit: 2024-02-17 | Discharge: 2024-02-17 | Disposition: A | Discharge status: Home / Self Care | Source: Ambulatory Visit | Attending: Urology | Admitting: Urology

## 2024-02-17 DIAGNOSIS — R972 Elevated prostate specific antigen [PSA]: Secondary | ICD-10-CM

## 2024-02-17 MED ORDER — GADOPICLENOL 0.5 MMOL/ML IV SOLN
9.0000 mL | Freq: Once | INTRAVENOUS | Status: AC | PRN
  Administered 2024-02-17: 9 mL via INTRAVENOUS

## 2024-02-28 ENCOUNTER — Encounter: Payer: Self-pay | Admitting: Urology

## 2024-02-28 NOTE — Progress Notes (Signed)
 [facility] Summary Report / Loop Recorder

## 2024-02-29 ENCOUNTER — Ambulatory Visit (INDEPENDENT_AMBULATORY_CARE_PROVIDER_SITE_OTHER): Payer: Medicare HMO

## 2024-02-29 DIAGNOSIS — I4892 Unspecified atrial flutter: Secondary | ICD-10-CM | POA: Diagnosis not present

## 2024-03-01 LAB — CUP PACEART REMOTE DEVICE CHECK
Date Time Interrogation Session: 20250821050546
Implantable Pulse Generator Implant Date: 20230629
Pulse Gen Model: 5000
Pulse Gen Serial Number: 511010278

## 2024-03-04 ENCOUNTER — Ambulatory Visit: Payer: Self-pay | Admitting: Cardiology

## 2024-03-06 ENCOUNTER — Other Ambulatory Visit: Payer: Self-pay | Admitting: Urology

## 2024-03-06 DIAGNOSIS — N401 Enlarged prostate with lower urinary tract symptoms: Secondary | ICD-10-CM

## 2024-03-22 NOTE — Progress Notes (Signed)
 Chief Complaint: Patient was seen in consultation today for benign prostatic hyperplasia with lower urinary tract symptoms.   Referring Physician(s): Wrenn,John  History of Present Illness: Keith Phelps is a 78 y.o. male with a medical history significant for CAD (MI, CABG), PE, atrial flutter and benign prostatic hyperplasia with lower urinary tract symptoms. He has a history of elevated PSA with remote prostate biopsies in 2002 and 2003 that were benign. He has experienced urinary retention during prior hospitalizations and has required foley catheterization.  No spontaneous episodes of acute retention.  The patient has seen his urologist twice this year and endorses issues with retention and incontinence. His PSA has risen and Dr. Watt ordered an MRI prostate which was obtained 02/17/24. Studies in the office showed a PVR of 649 ml. He has been on Alfuzosin but his symptoms are worsening. Multiple treatment options were reviewed and the patient is interested in pursuing prostate artery embolization. He has been referred to Interventional Radiology and presents to the clinic today for further discussion.      He is a retired Armed forces technical officer.  He is now an active gamer, ranked in the top 1% of x-box players in his age bracket.   Past Medical History:  Diagnosis Date   Actinic keratosis    Atrial fibrillation (HCC)    BPH (benign prostatic hyperplasia)    Coronary artery disease    Dermatitis    saborah   Hyperlipidemia    MI (myocardial infarction) (HCC)    Saddle embolism of pulmonary artery (HCC)    Sleep apnea    Umbilical hernia     Past Surgical History:  Procedure Laterality Date   APPENDECTOMY     CARDIAC CATHETERIZATION  06/13/2005   [facility]  EF 55%   CHOLECYSTECTOMY     CORONARY ARTERY BYPASS GRAFT  07/18/2000   x 4   DOPPLER ECHOCARDIOGRAPHY  02/21/2012   EF > 55%   DOPPLER ECHOCARDIOGRAPHY  03/16/2010   EF 45-50%   INSERTION OF MESH N/A  01/04/2017   Procedure: INSERTION OF MESH;  Surgeon: Lily Boas, MD;  Location: [facility];  Service: General;  Laterality: N/A;   NM MYOVIEW  LTD  03/16/2010   EF 64%  Low risk study   right shoulder arthroscopy  2015   UMBILICAL HERNIA REPAIR N/A 01/04/2017   Procedure: REPAIR CHRONICALLY INCARCERATED UMBILICAL HERNIA;  Surgeon: Lily Boas, MD;  Location: [facility];  Service: General;  Laterality: N/A;    Allergies: Doxycycline  Medications: Prior to Admission medications   Medication Sig Start Date End Date Taking? Authorizing Provider  alfuzosin (UROXATRAL) 10 MG 24 hr tablet Take 10 mg by mouth daily. 12/25/20   [provider]  aspirin  EC 81 MG tablet Take 1 tablet (81 mg total) by mouth daily. Swallow whole. 06/20/22   Cindie Ole DASEN, MD  atorvastatin  (LIPITOR) 40 MG tablet TAKE 1 TABLET (40 MG TOTAL) BY MOUTH DAILY. OV NEEDED 12/07/23   Swinyer, Rosaline HERO, NP  B Complex-C (B-COMPLEX WITH VITAMIN C) tablet Take 1 tablet by mouth daily. 08/18/00   [provider]  ezetimibe  (ZETIA ) 10 MG tablet Take 1 tablet (10 mg total) by mouth daily. 01/02/24   Hilty, Vinie BROCKS, MD  fenofibrate  160 MG tablet Take 1 tablet (160 mg total) by mouth daily. 01/02/24   Hilty, Vinie BROCKS, MD  fluorouracil  (EFUDEX ) 5 % cream Apply topically 2 (two) times daily. Apply to affected area bid for 14 days  then apply Hydrocortisone  06/19/23   Alm Delon SAILOR, DO  hydrocortisone  1 % ointment Apply 1 Application topically as needed for itching. Apply to areas treated with 5 FU cream 06/13/23   Alm Delon SAILOR, DO  hydrocortisone  2.5 % cream Apply topically 2 (two) times daily as needed (Rash). Apply to effected areas on face for 5 days then STOP 06/13/23   Alm Delon SAILOR, DO  nystatin (MYCOSTATIN/NYSTOP) powder Apply 1 g topically daily as needed (itching).    [provider]     Family History  Problem Relation Age of Onset   Stroke Father    Heart disease Father     Social  History   Socioeconomic History   Marital status: Married    Spouse name: Not on file   Number of children: Not on file   Years of education: Not on file   Highest education level: Not on file  Occupational History   Not on file  Tobacco Use   Smoking status: Never    Passive exposure: Never   Smokeless tobacco: Never  Vaping Use   Vaping status: Never Used  Substance and Sexual Activity   Alcohol use: No   Drug use: No   Sexual activity: Not on file  Other Topics Concern   Not on file  Social History Narrative   Not on file   Social Drivers of Health   Financial Resource Strain: Low Risk  (03/27/2022)   Received from [facility], [facility] visits prior to 09/10/2022.   Overall Financial Resource Strain (CARDIA)    Difficulty of Paying Living Expenses: Not hard at all  Food Insecurity: No Food Insecurity (03/27/2022)   [facility] visits prior to [facility]10/2022[facility]   Hunger Vital Sign    Within the past 12 months, you worried that your food would run out before you got the money to buy more.: Never true    Within the past 12 months, the food you bought just didn't last and you didn't have money to get more.: Never true  Transportation Needs: No Transportation Needs (03/27/2022)   Received from [facility], [facility] visits prior to 09/10/2022.   PRAPARE - Administrator, Civil Service (Medical): No    Lack of Transportation (Non-Medical): No  Physical Activity: Sufficiently Active (03/27/2022)   [facility] prior to [facility]/10/2022[facility]   Exercise Vital Sign    On average, how many days per week do you engage in moderate to strenuous exercise (like a brisk walk)?: 6 days    On average, how many minutes do you engage in exercise at this level?: 60 min  Stress: No Stress Concern Present (03/27/2022)   Received from [facility],  [facility] visits prior to 09/10/2022.   [employer] of Occupational Health - Occupational Stress Questionnaire    Feeling of Stress : Not at all  Social Connections: Socially Integrated (03/27/2022)   [facility] from [facility] visits prior to 09/10/2022[facility]   Social Connection and Isolation Panel    In a typical week, how many times do you talk on the phone with family, friends, or neighbors?: More than three times a week    How often do you get together with friends or relatives?: More than three times a week    How often do you attend church or  religious services?: More than 4 times per year    Do you belong to any clubs or organizations such as church groups, unions, fraternal or athletic groups, or school groups?: Yes    How often do you attend meetings of the clubs or organizations you belong to?: More than 4 times per year    Are you married, widowed, divorced, separated, never married, or living with a partner?: Married    Review of Systems: A 12 point ROS discussed and pertinent positives are indicated in the HPI above.  All other systems are negative.   Vital Signs: There were no vitals taken for this visit.  Advance Care Plan: The advanced care plan/surrogate decision maker was discussed at the time of visit and documented in the medical record.    Physical Exam Constitutional:      General: He is not in acute distress. HENT:     Head: Normocephalic.     Mouth/Throat:     Mouth: Mucous membranes are moist.  Eyes:     General: No scleral icterus. Cardiovascular:     Rate and Rhythm: Normal rate and regular rhythm.  Pulmonary:     Effort: No respiratory distress.  Abdominal:     General: There is no distension.  Musculoskeletal:     Right lower leg: No edema.     Left lower leg: No edema.  Skin:    General: Skin is warm and dry.  Neurological:     Mental Status: He is alert and oriented to person, place,  and time.     Imaging:  MR Prostate 02/17/24   The prostate gland measures 6.8 by 8.7 by 5.4 cm (volume = 170 cm^3).  Labs:  CBC: No results for input(s): WBC, HGB, HCT, PLT in the last 8760 hours.  COAGS: No results for input(s): INR, APTT in the last 8760 hours.  BMP: No results for input(s): NA, K, CL, CO2, GLUCOSE, BUN, CALCIUM , CREATININE, GFRNONAA, GFRAA in the last 8760 hours.  Invalid input(s): CMP  LIVER FUNCTION TESTS: No results for input(s): BILITOT, AST, ALT, ALKPHOS, PROT, ALBUMIN in the last 8760 hours.  TUMOR MARKERS: No results for input(s): AFPTM, CEA, CA199, CHROMGRNA in the last 8760 hours.  Assessment and Plan: 78 year old male with a history of benign prostatic hyperplasia (170 g) with moderate lower urinary tract symptoms (IPSS-QoL 11-3). He would be an excellent candidate for prostate artery embolization.  We discussed the rationale, periprocedural expectations, and long term expected outcomes after prostate artery embolization.  He would like some more time to think about the procedure, stating he is leaning toward proceeding.  If he decides to proceed, plan for prostate artery embolization via left radial artery access with moderate sedation at [facility].  Will require CTA pelvis prior to the procedure day.    Ester Sides, MD Pager: [phone]    I spent a total of  40 Minutes   in face to face in clinical consultation, greater than 50% of which was counseling/coordinating care for benign prostatic hyperplasia.

## 2024-03-25 ENCOUNTER — Ambulatory Visit
Admission: RE | Admit: 2024-03-25 | Discharge: 2024-03-25 | Disposition: A | Discharge status: Home / Self Care | Source: Ambulatory Visit | Attending: Urology | Admitting: Urology

## 2024-03-25 DIAGNOSIS — N401 Enlarged prostate with lower urinary tract symptoms: Secondary | ICD-10-CM

## 2024-03-25 HISTORY — PX: IR RADIOLOGIST EVAL & MGMT: IMG5224

## 2024-03-29 ENCOUNTER — Other Ambulatory Visit: Payer: Self-pay | Admitting: Interventional Radiology

## 2024-03-29 DIAGNOSIS — N401 Enlarged prostate with lower urinary tract symptoms: Secondary | ICD-10-CM

## 2024-04-01 ENCOUNTER — Ambulatory Visit (INDEPENDENT_AMBULATORY_CARE_PROVIDER_SITE_OTHER): Payer: Medicare HMO

## 2024-04-01 ENCOUNTER — Other Ambulatory Visit

## 2024-04-01 DIAGNOSIS — I4892 Unspecified atrial flutter: Secondary | ICD-10-CM

## 2024-04-01 DIAGNOSIS — N401 Enlarged prostate with lower urinary tract symptoms: Secondary | ICD-10-CM

## 2024-04-01 LAB — CUP PACEART REMOTE DEVICE CHECK
Date Time Interrogation Session: 20250922050132
Implantable Pulse Generator Implant Date: 20230629
Pulse Gen Model: 5000
Pulse Gen Serial Number: 511010278

## 2024-04-01 LAB — I-STAT CREATININE (MANUAL ENTRY): Creatinine, Ser: 1.2 — AB (ref 0.50–1.10)

## 2024-04-01 MED ORDER — IOHEXOL 350 MG/ML SOLN
100.0000 mL | Freq: Once | INTRAVENOUS | Status: AC | PRN
  Administered 2024-04-01: 100 mL via INTRAVENOUS

## 2024-04-02 NOTE — Progress Notes (Signed)
 Remote Loop Recorder Transmission

## 2024-04-03 ENCOUNTER — Ambulatory Visit: Payer: Self-pay | Admitting: Cardiology

## 2024-04-04 ENCOUNTER — Other Ambulatory Visit (HOSPITAL_COMMUNITY): Payer: Self-pay | Admitting: Interventional Radiology

## 2024-04-04 DIAGNOSIS — N401 Enlarged prostate with lower urinary tract symptoms: Secondary | ICD-10-CM

## 2024-04-15 NOTE — Progress Notes (Signed)
 Remote Loop Recorder Transmission

## 2024-05-02 ENCOUNTER — Ambulatory Visit (INDEPENDENT_AMBULATORY_CARE_PROVIDER_SITE_OTHER): Payer: Medicare HMO

## 2024-05-02 DIAGNOSIS — I4892 Unspecified atrial flutter: Secondary | ICD-10-CM

## 2024-05-02 LAB — CUP PACEART REMOTE DEVICE CHECK
Date Time Interrogation Session: 20251023054718
Implantable Pulse Generator Implant Date: 20230629
Pulse Gen Model: 5000
Pulse Gen Serial Number: 511010278

## 2024-05-03 NOTE — Progress Notes (Signed)
 Remote Loop Recorder Transmission

## 2024-05-06 ENCOUNTER — Other Ambulatory Visit (HOSPITAL_COMMUNITY): Payer: Self-pay | Admitting: Student

## 2024-05-06 ENCOUNTER — Telehealth (HOSPITAL_COMMUNITY): Payer: Self-pay | Admitting: Student

## 2024-05-06 ENCOUNTER — Ambulatory Visit: Payer: Self-pay | Admitting: Cardiology

## 2024-05-06 DIAGNOSIS — N400 Enlarged prostate without lower urinary tract symptoms: Secondary | ICD-10-CM

## 2024-05-06 MED ORDER — SOLIFENACIN SUCCINATE 5 MG PO TABS: 5.0000 mg | ORAL_TABLET | Freq: Every day | ORAL | 0 refills | Status: AC

## 2024-05-06 MED ORDER — CIPROFLOXACIN HCL 500 MG PO TABS: 500.0000 mg | ORAL_TABLET | Freq: Two times a day (BID) | ORAL | 0 refills | Status: AC

## 2024-05-06 MED ORDER — METHYLPREDNISOLONE 4 MG PO TBPK: ORAL_TABLET | ORAL | 0 refills | Status: AC

## 2024-05-06 MED ORDER — PHENAZOPYRIDINE HCL 100 MG PO TABS: 100.0000 mg | ORAL_TABLET | Freq: Three times a day (TID) | ORAL | 0 refills | Status: AC

## 2024-05-06 NOTE — Telephone Encounter (Signed)
 Patient scheduled for prostate artery embolization with Dr. Jennefer 05/07/24. Post-procedure medications e-prescribed to his pharmacy. I spoke with the patient's wife this morning about tomorrow's procedure and she is aware to have patient to [facility] by 7 am and that he needs to be NPO after midnight. Patient ok to take any  necessary medications with small sips of water. She will pick up his medications today and is aware these are for POST procedure. She has the number to reach me back if she has any questions.  Warren Dais, AGACNP-BC 05/06/2024, 8:57 AM

## 2024-05-06 NOTE — H&P (Signed)
 Chief Complaint: Symptomatic benign prostatic hyperplasia. Patient presents for prostate artery embolization  Referring Physician(s): Dr. Norleen Seltzer  Supervising Physician: Jennefer Rover  Patient Status: [facility] - Out-pt  History of Present Illness: Keith Phelps is a 78 y.o. male outpatient. History of CAD, MI  s/p CABG, a flutter, benign prostatic hyperplasia with UTI and urinary retention. The patient was seen for consultation in the Interventional Radiology Clinic  on 9.15.25 with IR Attending Dr. Rover Jennefer. At that time a detailed discussion regarding the Patient's medical condition including but not limited to possible treatment options took place. Following that discussion the Patient and his  elected to proceed with prostate artery embolization. The Patient presents today for prostate artery embolization.   Wife at bedside. Currently without any significant complaints. Patient alert and laying in bed,calm. Denies any fevers, headache, chest pain, SOB, cough, abdominal pain, nausea, vomiting or bleeding.    CT Angio Pelvis from 9.23.25 reads Prostatomegaly. The prostate gland measures 7.3 x 6.3 x 8.1 cm (195 cc)IPSS score 11. QoL score 3 which is mixed. PSA from 10.28.25 -  8.1. Labs pending. Patient is on 81 mg of ASA, last dose given on 10.27.25. Allergies inlcude doxycyline. Patient has been NPO since midnight.   Return precautions and treatment recommendations and follow-up discussed with the patient and wife. Both who are agreeable with the plan.    Past Medical History:  Diagnosis Date   Actinic keratosis    Atrial fibrillation (HCC)    BPH (benign prostatic hyperplasia)    Coronary artery disease    Dermatitis    saborah   Hyperlipidemia    MI (myocardial infarction) (HCC)    Saddle embolism of pulmonary artery (HCC)    Sleep apnea    Umbilical hernia     Past Surgical History:  Procedure Laterality Date   APPENDECTOMY     CARDIAC CATHETERIZATION   06/13/2005   [facility]  EF 55%   CHOLECYSTECTOMY     CORONARY ARTERY BYPASS GRAFT  07/18/2000   x 4   DOPPLER ECHOCARDIOGRAPHY  02/21/2012   EF > 55%   DOPPLER ECHOCARDIOGRAPHY  03/16/2010   EF 45-50%   INSERTION OF MESH N/A 01/04/2017   Procedure: INSERTION OF MESH;  Surgeon: Lily Boas, MD;  Location: [facility];  Service: General;  Laterality: N/A;   IR RADIOLOGIST EVAL & MGMT  03/25/2024   NM MYOVIEW  LTD  03/16/2010   EF 64%  Low risk study   right shoulder arthroscopy  2015   UMBILICAL HERNIA REPAIR N/A 01/04/2017   Procedure: REPAIR CHRONICALLY INCARCERATED UMBILICAL HERNIA;  Surgeon: Lily Boas, MD;  Location: [facility];  Service: General;  Laterality: N/A;    Allergies: Doxycycline  Medications: Prior to Admission medications   Medication Sig Start Date End Date Taking? Authorizing Provider  alfuzosin (UROXATRAL) 10 MG 24 hr tablet Take 10 mg by mouth daily. 12/25/20   [provider]  aspirin  EC 81 MG tablet Take 1 tablet (81 mg total) by mouth daily. Swallow whole. 06/20/22   Cindie Ole DASEN, MD  atorvastatin  (LIPITOR) 40 MG tablet TAKE 1 TABLET (40 MG TOTAL) BY MOUTH DAILY. OV NEEDED 12/07/23   Swinyer, Rosaline HERO, NP  B Complex-C (B-COMPLEX WITH VITAMIN C) tablet Take 1 tablet by mouth daily. 08/18/00   [provider]  ciprofloxacin (CIPRO) 500 MG tablet Take 1 tablet (500 mg total) by mouth 2 (two) times daily for 7 days. 05/06/24 05/13/24  Covington, Jamie R,  NP  ezetimibe  (ZETIA ) 10 MG tablet Take 1 tablet (10 mg total) by mouth daily. 01/02/24   Hilty, Vinie BROCKS, MD  fenofibrate  160 MG tablet Take 1 tablet (160 mg total) by mouth daily. 01/02/24   Hilty, Vinie BROCKS, MD  fluorouracil  (EFUDEX ) 5 % cream Apply topically 2 (two) times daily. Apply to affected area bid for 14 days then apply Hydrocortisone  06/19/23   Alm Delon SAILOR, DO  hydrocortisone  1 % ointment Apply 1 Application topically as needed for itching. Apply to areas treated with 5 FU  cream 06/13/23   Alm Delon SAILOR, DO  hydrocortisone  2.5 % cream Apply topically 2 (two) times daily as needed (Rash). Apply to effected areas on face for 5 days then STOP 06/13/23   Alm Delon SAILOR, DO  methylPREDNISolone (MEDROL DOSEPAK) 4 MG TBPK tablet Dispense per instructions on box. 05/06/24   Covington, Jamie R, NP  nystatin (MYCOSTATIN/NYSTOP) powder Apply 1 g topically daily as needed (itching).    [provider]  phenazopyridine (PYRIDIUM) 100 MG tablet Take 1 tablet (100 mg total) by mouth in the morning, at noon, and at bedtime for 7 days. 05/06/24 05/13/24  Covington, Jamie R, NP  solifenacin (VESICARE) 5 MG tablet Take 1 tablet (5 mg total) by mouth daily for 7 days. 05/06/24 05/13/24  Tonette Warren SAUNDERS, NP     Family History  Problem Relation Age of Onset   Stroke Father    Heart disease Father     Social History   Socioeconomic History   Marital status: Married    Spouse name: Not on file   Number of children: Not on file   Years of education: Not on file   Highest education level: Not on file  Occupational History   Not on file  Tobacco Use   Smoking status: Never    Passive exposure: Never   Smokeless tobacco: Never  Vaping Use   Vaping status: Never Used  Substance and Sexual Activity   Alcohol use: No   Drug use: No   Sexual activity: Not on file  Other Topics Concern   Not on file  Social History Narrative   Not on file   Social Drivers of Health   Financial Resource Strain: Low Risk  (03/27/2022)   Received from [facility], [facility] visits prior to 09/10/2022.   Overall Financial Resource Strain (CARDIA)    Difficulty of Paying Living Expenses: Not hard at all  Food Insecurity: No Food Insecurity (03/27/2022)   Received from [facility] prior to 09/10/2022[facility]   Hunger Vital Sign    Within the past 12 months, you worried that your food would run out before you got the money  to buy more.: Never true    Within the past 12 months, the food you bought just didn't last and you didn't have money to get more.: Never true  Transportation Needs: No Transportation Needs (03/27/2022)   Received from [facility], [facility] visits prior to 09/10/2022.   PRAPARE - Administrator, Civil Service (Medical): No    Lack of Transportation (Non-Medical): No  Physical Activity: Sufficiently Active (03/27/2022)   [facility] prior to 09/10/2022., [facility]   Exercise Vital Sign    On average, how many days per week do you engage in moderate to strenuous exercise (like a brisk walk)?: 6 days    On  average, how many minutes do you engage in exercise at this level?: 60 min  Stress: No Stress Concern Present (03/27/2022)   Received from [facility], [facility] visits prior to 09/10/2022.   [employer] of Occupational Health - Occupational Stress Questionnaire    Feeling of Stress : Not at all  Social Connections: Socially Integrated (03/27/2022)   [facility] prior to 09/10/2022., [facility]   Social Connection and Isolation Panel    In a typical week, how many times do you talk on the phone with family, friends, or neighbors?: More than three times a week    How often do you get together with friends or relatives?: More than three times a week    How often do you attend church or religious services?: More than 4 times per year    Do you belong to any clubs or organizations such as church groups, unions, fraternal or athletic groups, or school groups?: Yes    How often do you attend meetings of the clubs or organizations you belong to?: More than 4 times per year    Are you married, widowed, divorced, separated, never married, or living with a partner?: Married     Review of Systems: A 12 point ROS discussed and pertinent positives are  indicated in the HPI above.  All other systems are negative.  Review of Systems  Constitutional:  Negative for fever.  HENT:  Negative for congestion.   Respiratory:  Negative for cough and shortness of breath.   Cardiovascular:  Negative for chest pain.  Gastrointestinal:  Negative for abdominal pain.  Neurological:  Negative for headaches.  Psychiatric/Behavioral:  Negative for behavioral problems and confusion.     Vital Signs: BP 128/64   Pulse 63   Temp 98.2 F (36.8 C) (Oral)   Resp 14   Ht 5' 10 (1.778 m)   Wt 192 lb (87.1 kg)   SpO2 98%   BMI 27.55 kg/m   Advance Care Plan: The advanced care plan/surrogate decision maker was discussed at the time of visit and the patient did not wish to discuss or was not able to name a surrogate decision maker or provide an advance care plan.    Physical Exam Vitals and nursing note reviewed.  Constitutional:      Appearance: He is well-developed.  HENT:     Head: Normocephalic.  Cardiovascular:     Rate and Rhythm: Normal rate.  Pulmonary:     Effort: Pulmonary effort is normal.  Genitourinary:    Comments: Foley catheter present. Yellow colored fluid noted in the foley bag Musculoskeletal:        General: Normal range of motion.     Cervical back: Normal range of motion.  Skin:    General: Skin is warm and dry.  Neurological:     General: No focal deficit present.     Mental Status: He is alert and oriented to person, place, and time. Mental status is at baseline.  Psychiatric:        Mood and Affect: Mood normal.        Behavior: Behavior normal.        Thought Content: Thought content normal.        Judgment: Judgment normal.     Imaging: CUP PACEART REMOTE DEVICE CHECK Result Date: 05/02/2024 ILR summary report received. Battery status OK. Normal device function. No new symptom, tachy, brady, or pause episodes. No new  AF episodes. Monthly summary reports and ROV/PRN - CS, CVRS   Labs:  CBC: Recent Labs     05/07/24 0804  WBC 4.5  HGB 13.3  HCT 38.3*  PLT 250    COAGS: Recent Labs    05/07/24 0804  INR 1.0    BMP: Recent Labs    04/01/24 1311 05/07/24 0804  NA  --  136  K  --  4.1  CL  --  104  CO2  --  22  GLUCOSE  --  132*  BUN  --  11  CALCIUM   --  8.7*  CREATININE 1.20* 0.97  GFRNONAA  --  >60    LIVER FUNCTION TESTS: No results for input(s): BILITOT, AST, ALT, ALKPHOS, PROT, ALBUMIN in the last 8760 hours.  TUMOR MARKERS: No results for input(s): AFPTM, CEA, CA199, CHROMGRNA in the last 8760 hours.  Assessment and Plan:  78 y.o. male outpatient. History of CAD, MI  s/p CABG, a flutter, benign prostatic hyperplasia with UTI and urinary retention. The patient was seen for consultation in the Interventional Radiology Clinic  on 9.15.25 with IR Attending Dr. Ester Sides. At that time a detailed discussion regarding the Patient's medical condition including but not limited to possible treatment options took place. Following that discussion the Patient and his  elected to proceed with prostate artery embolization. The Patient presents today for prostate artery embolization.   PLAN: IR Image Guided Prostate Artery Embolization  The Risks and benefits of embolization were discussed with the patient including, but not limited to bleeding, infection, vascular injury, post operative pain, or contrast induced renal failure.  This procedure involves the use of X-rays and because of the nature of the planned procedure, it is possible that we will have prolonged use of X-ray fluoroscopy.  Potential radiation risks to you include (but are not limited to) the following: - A slightly elevated risk for cancer several years later in life. This risk is typically less than 0.5% percent. This risk is low in comparison to the normal incidence of human cancer, which is 33% for women and 50% for men according to the American Cancer Society. - Radiation induced  injury can include skin redness, resembling a rash, tissue breakdown / ulcers and hair loss (which can be temporary or permanent).   The likelihood of either of these occurring depends on the difficulty of the procedure and whether you are sensitive to radiation due to previous procedures, disease, or genetic conditions.   IF your procedure requires a prolonged use of radiation, you will be notified and given written instructions for further action.  It is your responsibility to monitor the irradiated area for the 2 weeks following the procedure and to notify your physician if you are concerned that you have suffered a radiation induced injury.    All of the patient's questions were answered, patient is agreeable to proceed. Consent signed and in chart.   Thank you for this interesting consult.  I greatly enjoyed meeting NORIEL GUTHRIE and look forward to participating in their care.  A copy of this report was sent to the requesting provider on this date.  Electronically Signed: Delon JAYSON Beagle, NP 05/07/2024, 9:02 AM   I spent a total of    15 Minutes in face to face in clinical consultation, greater than 50% of which was counseling/coordinating care for prostate artery embolization

## 2024-05-07 ENCOUNTER — Other Ambulatory Visit (HOSPITAL_COMMUNITY): Payer: Self-pay | Admitting: Interventional Radiology

## 2024-05-07 ENCOUNTER — Ambulatory Visit (HOSPITAL_COMMUNITY)
Admission: RE | Admit: 2024-05-07 | Discharge: 2024-05-07 | Disposition: A | Discharge status: Home / Self Care | Source: Ambulatory Visit | Attending: Interventional Radiology | Admitting: Interventional Radiology

## 2024-05-07 ENCOUNTER — Other Ambulatory Visit: Payer: Self-pay

## 2024-05-07 DIAGNOSIS — N401 Enlarged prostate with lower urinary tract symptoms: Secondary | ICD-10-CM | POA: Diagnosis present

## 2024-05-07 DIAGNOSIS — R339 Retention of urine, unspecified: Secondary | ICD-10-CM | POA: Insufficient documentation

## 2024-05-07 DIAGNOSIS — N400 Enlarged prostate without lower urinary tract symptoms: Secondary | ICD-10-CM

## 2024-05-07 HISTORY — PX: IR 3D INDEPENDENT WKST: IMG2385

## 2024-05-07 HISTORY — PX: IR EMBO ARTERIAL NOT HEMORR HEMANG INC GUIDE ROADMAPPING: IMG5448

## 2024-05-07 HISTORY — PX: IR ANGIOGRAM PELVIS SELECTIVE OR SUPRASELECTIVE: IMG661

## 2024-05-07 HISTORY — PX: IR ANGIOGRAM SELECTIVE EACH ADDITIONAL VESSEL: IMG667

## 2024-05-07 HISTORY — PX: IR US GUIDE VASC ACCESS LEFT: IMG2389

## 2024-05-07 LAB — BASIC METABOLIC PANEL WITH GFR
Anion gap: 10 (ref 5–15)
BUN: 11 mg/dL (ref 8–23)
CO2: 22 mmol/L (ref 22–32)
Calcium: 8.7 mg/dL — ABNORMAL LOW (ref 8.9–10.3)
Chloride: 104 mmol/L (ref 98–111)
Creatinine, Ser: 0.97 mg/dL (ref 0.61–1.24)
GFR, Estimated: >60 mL/min (ref >60)
Glucose, Bld: 132 mg/dL — ABNORMAL HIGH (ref 70–99)
Potassium: 4.1 mmol/L (ref 3.5–5.1)
Sodium: 136 mmol/L (ref 135–145)

## 2024-05-07 LAB — CBC
HCT: 38.3 % — ABNORMAL LOW (ref 39.0–52.0)
Hemoglobin: 13.3 g/dL (ref 13.0–17.0)
MCH: 31.7 pg (ref 26.0–34.0)
MCHC: 34.7 g/dL (ref 30.0–36.0)
MCV: 91.4 fL (ref 80.0–100.0)
Platelets: 250 K/uL (ref 150–400)
RBC: 4.19 MIL/uL — ABNORMAL LOW (ref 4.22–5.81)
RDW: 12.2 % (ref 11.5–15.5)
WBC: 4.5 K/uL (ref 4.0–10.5)
nRBC: 0 % (ref 0.0–0.2)

## 2024-05-07 LAB — PROTIME-INR
INR: 1 (ref 0.8–1.2)
Prothrombin Time: 13.3 s (ref 11.4–15.2)

## 2024-05-07 MED ORDER — MIDAZOLAM HCL 2 MG/2ML IJ SOLN
INTRAMUSCULAR | Status: AC
  Filled 2024-05-07: qty 2

## 2024-05-07 MED ORDER — IODIXANOL 320 MG/ML IV SOLN: 100.0000 mL | Freq: Once | INTRAVENOUS | Status: DC | PRN

## 2024-05-07 MED ORDER — CIPROFLOXACIN IN D5W 400 MG/200ML IV SOLN
400.0000 mg | INTRAVENOUS | Status: AC
  Administered 2024-05-07: 400 mg via INTRAVENOUS

## 2024-05-07 MED ORDER — CHLORHEXIDINE GLUCONATE CLOTH 2 % EX PADS: 6.0000 | MEDICATED_PAD | Freq: Every day | CUTANEOUS | Status: DC

## 2024-05-07 MED ORDER — SODIUM CHLORIDE (PF) 0.9 % IJ SOLN
INTRAVENOUS | Status: DC | PRN
  Administered 2024-05-07 (×2): 200 ug via INTRA_ARTERIAL

## 2024-05-07 MED ORDER — NITROGLYCERIN 1 MG/10 ML FOR IR/CATH LAB
INTRA_ARTERIAL | Status: AC
  Filled 2024-05-07: qty 10

## 2024-05-07 MED ORDER — VERAPAMIL HCL 2.5 MG/ML IV SOLN
INTRAVENOUS | Status: AC
  Filled 2024-05-07: qty 2

## 2024-05-07 MED ORDER — HEPARIN SODIUM (PORCINE) 1000 UNIT/ML IJ SOLN
INTRAMUSCULAR | Status: AC
  Filled 2024-05-07: qty 10

## 2024-05-07 MED ORDER — CIPROFLOXACIN IN D5W 400 MG/200ML IV SOLN
INTRAVENOUS | Status: AC
  Filled 2024-05-07: qty 200

## 2024-05-07 MED ORDER — FENTANYL CITRATE (PF) 100 MCG/2ML IJ SOLN
INTRAMUSCULAR | Status: AC
  Filled 2024-05-07: qty 2

## 2024-05-07 MED ORDER — MIDAZOLAM HCL (PF) 2 MG/2ML IJ SOLN
INTRAMUSCULAR | Status: DC | PRN
  Administered 2024-05-07 (×2): 1 mg via INTRAVENOUS

## 2024-05-07 MED ORDER — FENTANYL CITRATE (PF) 100 MCG/2ML IJ SOLN
INTRAMUSCULAR | Status: DC | PRN
  Administered 2024-05-07: 50 ug via INTRAVENOUS

## 2024-05-07 MED ORDER — LIDOCAINE-PRILOCAINE 2.5-2.5 % EX CREA: TOPICAL_CREAM | Freq: Once | CUTANEOUS | Status: AC

## 2024-05-07 MED ORDER — SODIUM CHLORIDE 0.9 % IV SOLN: INTRAVENOUS | Status: DC

## 2024-05-07 MED ORDER — LIDOCAINE HCL URETHRAL/MUCOSAL 2 % EX GEL
1.0000 | Freq: Once | CUTANEOUS | Status: AC
  Administered 2024-05-07: 1 via URETHRAL

## 2024-05-07 MED ORDER — PREDNISONE 20 MG PO TABS
20.0000 mg | ORAL_TABLET | Freq: Once | ORAL | Status: AC
  Administered 2024-05-07: 20 mg via ORAL
  Filled 2024-05-07: qty 1

## 2024-05-07 MED ORDER — VERAPAMIL HCL 2.5 MG/ML IV SOLN: INTRA_ARTERIAL | Status: DC | PRN

## 2024-05-07 MED ORDER — NITROGLYCERIN 2 % TD OINT
1.0000 [in_us] | TOPICAL_OINTMENT | Freq: Once | TRANSDERMAL | Status: AC
  Administered 2024-05-07: 1 [in_us] via TOPICAL

## 2024-05-07 NOTE — Progress Notes (Signed)
 Discharge instructions reviewed with pt and wife Cherene at the bedside. Denies questions or concerns. PT tolerated PO intake. Ambulated to the bathroom was able to void without difficulty, Stated that it burned when he urinated, and it had a small drop of blood present. TR Band was removed. No s/s of complications at the incision. PT escorted from the unit via wheel chair to personal vehicle.

## 2024-05-07 NOTE — Discharge Instructions (Signed)

## 2024-05-07 NOTE — Procedures (Signed)
 Interventional Radiology Procedure Note  Procedure: Prostate artery embolization  Findings: Please refer to procedural dictation for full description. Left radial artery access, TR band applied at 10:45 with 11 cc air.  Complications: None immediate  Estimated Blood Loss: < 5 mL  Recommendations: IR will arrange for 1 month outpatient follow up.   Ester Sides, MD

## 2024-05-30 MED ORDER — IOHEXOL 300 MG/ML  SOLN
100.0000 mL | Freq: Once | INTRAMUSCULAR | Status: AC | PRN
  Administered 2024-05-30: 84 mL via INTRA_ARTERIAL

## 2024-06-02 ENCOUNTER — Ambulatory Visit

## 2024-06-02 DIAGNOSIS — I4892 Unspecified atrial flutter: Secondary | ICD-10-CM

## 2024-06-03 LAB — CUP PACEART REMOTE DEVICE CHECK
Date Time Interrogation Session: 20251123050229
Implantable Pulse Generator Implant Date: 20230629
Pulse Gen Model: 5000
Pulse Gen Serial Number: 511010278

## 2024-06-04 NOTE — Progress Notes (Signed)
 Remote Loop Recorder Transmission

## 2024-06-05 ENCOUNTER — Ambulatory Visit: Payer: Self-pay | Admitting: Cardiology

## 2024-06-10 ENCOUNTER — Other Ambulatory Visit: Payer: Self-pay | Admitting: Interventional Radiology

## 2024-06-10 DIAGNOSIS — N401 Enlarged prostate with lower urinary tract symptoms: Secondary | ICD-10-CM

## 2024-06-21 NOTE — Progress Notes (Signed)
 Referring Physician(s): Dr. Watt  Chief Complaint: The patient is seen in follow up today s/p prostate artery embolization 05/07/24  History of present illness: HPI from initial consultation 03/25/24 Keith Phelps is a 78 y.o. male with a medical history significant for CAD (MI, CABG), PE, atrial flutter and benign prostatic hyperplasia with lower urinary tract symptoms. He has a history of elevated PSA with remote prostate biopsies in 2002 and 2003 that were benign. He has experienced urinary retention during prior hospitalizations and has required foley catheterization.  No spontaneous episodes of acute retention.   The patient has seen his urologist twice this year and endorses issues with retention and incontinence. His PSA has risen and Dr. Watt ordered an MRI prostate which was obtained 02/17/24. Studies in the office showed a PVR of 649 ml. He has been on Alfuzosin but his symptoms are worsening. Multiple treatment options were reviewed and the patient is interested in pursuing prostate artery embolization. He has been referred to Interventional Radiology and presents to the clinic today for further discussion.   He was considered an excellent candidate for prostate artery embolization and we discussed the rationale, periprocedural expectations, and long term expected outcomes after prostate artery embolization. He requested time to think about his options but ultimately elected to proceed. A CTA pelvis was obtained for procedure planning and on 05/07/24 he underwent a technically successful bilateral prostate artery embolization. He tolerated the procedure well and was discharged home the same day.   He presents to the IR outpatient clinic today for follow up. IPSS-QoL is now 3-1, down from 11-3.  His severe urgency symptoms have resolved completely.  Nocturia has significantly decreased.  He had some pelvic discomfort for several days after the procedure, but this has resolved.  Overall  he is very pleased with the results.   Past Medical History:  Diagnosis Date   Actinic keratosis    Atrial fibrillation (HCC)    BPH (benign prostatic hyperplasia)    Coronary artery disease    Dermatitis    saborah   Hyperlipidemia    MI (myocardial infarction) (HCC)    Saddle embolism of pulmonary artery (HCC)    Sleep apnea    Umbilical hernia     Past Surgical History:  Procedure Laterality Date   APPENDECTOMY     CARDIAC CATHETERIZATION  06/13/2005   [facility]  EF 55%   CHOLECYSTECTOMY     CORONARY ARTERY BYPASS GRAFT  07/18/2000   x 4   DOPPLER ECHOCARDIOGRAPHY  02/21/2012   EF > 55%   DOPPLER ECHOCARDIOGRAPHY  03/16/2010   EF 45-50%   INSERTION OF MESH N/A 01/04/2017   Procedure: INSERTION OF MESH;  Surgeon: Lily Boas, MD;  Location: [facility];  Service: General;  Laterality: N/A;   IR 3D INDEPENDENT WKST  05/07/2024   IR ANGIOGRAM PELVIS SELECTIVE OR SUPRASELECTIVE  05/07/2024   IR ANGIOGRAM SELECTIVE EACH ADDITIONAL VESSEL  05/07/2024   IR ANGIOGRAM SELECTIVE EACH ADDITIONAL VESSEL  05/07/2024   IR EMBO ARTERIAL NOT HEMORR HEMANG INC GUIDE ROADMAPPING  05/07/2024   IR RADIOLOGIST EVAL & MGMT  03/25/2024   IR US  GUIDE VASC ACCESS LEFT  05/07/2024   NM MYOVIEW  LTD  03/16/2010   EF 64%  Low risk study   right shoulder arthroscopy  2015   UMBILICAL HERNIA REPAIR N/A 01/04/2017   Procedure: REPAIR CHRONICALLY INCARCERATED UMBILICAL HERNIA;  Surgeon: Lily Boas, MD;  Location: [facility];  Service: General;  Laterality: N/A;  Allergies: Doxycycline  Medications: Prior to Admission medications  Medication Sig Start Date End Date Taking? Authorizing Provider  alfuzosin (UROXATRAL) 10 MG 24 hr tablet Take 10 mg by mouth daily. 12/25/20   [provider]  aspirin  EC 81 MG tablet Take 1 tablet (81 mg total) by mouth daily. Swallow whole. 06/20/22   Cindie Ole DASEN, MD  atorvastatin  (LIPITOR) 40 MG tablet TAKE 1 TABLET (40 MG TOTAL) BY MOUTH DAILY.  OV NEEDED 12/07/23   Swinyer, Rosaline HERO, NP  B Complex-C (B-COMPLEX WITH VITAMIN C) tablet Take 1 tablet by mouth daily. 08/18/00   [provider]  ezetimibe  (ZETIA ) 10 MG tablet Take 1 tablet (10 mg total) by mouth daily. 01/02/24   Hilty, Vinie BROCKS, MD  fenofibrate  160 MG tablet Take 1 tablet (160 mg total) by mouth daily. 01/02/24   Hilty, Vinie BROCKS, MD  fluorouracil  (EFUDEX ) 5 % cream Apply topically 2 (two) times daily. Apply to affected area bid for 14 days then apply Hydrocortisone  06/19/23   Alm Delon SAILOR, DO  hydrocortisone  1 % ointment Apply 1 Application topically as needed for itching. Apply to areas treated with 5 FU cream 06/13/23   Alm Delon SAILOR, DO  hydrocortisone  2.5 % cream Apply topically 2 (two) times daily as needed (Rash). Apply to effected areas on face for 5 days then STOP 06/13/23   Alm Delon SAILOR, DO  methylPREDNISolone  (MEDROL  DOSEPAK) 4 MG TBPK tablet Dispense per instructions on box. 05/06/24   Covington, Jamie R, NP  nystatin (MYCOSTATIN/NYSTOP) powder Apply 1 g topically daily as needed (itching).    [provider]     Family History  Problem Relation Age of Onset   Stroke Father    Heart disease Father     Social History   Socioeconomic History   Marital status: Married    Spouse name: Not on file   Number of children: Not on file   Years of education: Not on file   Highest education level: Not on file  Occupational History   Not on file  Tobacco Use   Smoking status: Never    Passive exposure: Never   Smokeless tobacco: Never  Vaping Use   Vaping status: Never Used  Substance and Sexual Activity   Alcohol use: No   Drug use: No   Sexual activity: Not on file  Other Topics Concern   Not on file  Social History Narrative   Not on file   Social Drivers of Health   Tobacco Use: Low Risk (12/05/2023)   Patient History    Smoking Tobacco Use: Never    Smokeless Tobacco Use: Never    Passive Exposure: Never  Financial  Resource Strain: Low Risk (03/27/2022)   [facility] from [facility]   Overall Financial Resource Strain (CARDIA)    Difficulty of Paying Living Expenses: Not hard at all  Food Insecurity: No Food Insecurity (03/27/2022)   [facility] from [facility] visits prior to 09/10/2022., [facility]   Epic    Within the past 12 months, you worried that your food would run out before you got the money to buy more.: Never true    Within the past 12 months, the food you bought just didn't last and you didn't have money to get more.: Never true  Transportation Needs: No Transportation Needs (03/27/2022)   Received from [facility], [facility] visits prior to 09/10/2022.   PRAPARE - Transportation    Lack  of Transportation (Medical): No    Lack of Transportation (Non-Medical): No  Physical Activity: Sufficiently Active (03/27/2022)   Received from [facility] visits prior to 09/10/2022., [facility]   Exercise Vital Sign    On average, how many days per week do you engage in moderate to strenuous exercise (like a brisk walk)?: 6 days    On average, how many minutes do you engage in exercise at this level?: 60 min  Stress: No Stress Concern Present (03/27/2022)   Received from [facility], [facility] visits prior to 09/10/2022.   [employer] of Occupational Health - Occupational Stress Questionnaire    Feeling of Stress : Not at all  Social Connections: Socially Integrated (03/27/2022)   [facility] prior to 09/10/2022., [facility]   Social Connection and Isolation Panel    In a typical week, how many times do you talk on the phone with family, friends, or neighbors?: More than three times a week    How often do you get together with friends or relatives?: More than three times a week    How often do you attend church or religious services?: More than 4 times per year     Do you belong to any clubs or organizations such as church groups, unions, fraternal or athletic groups, or school groups?: Yes    How often do you attend meetings of the clubs or organizations you belong to?: More than 4 times per year    Are you married, widowed, divorced, separated, never married, or living with a partner?: Married  Depression (PHQ2-9): Not on file  Alcohol Screen: Not on file  Housing: Not on file  Utilities: Not At Risk (03/27/2022)   Received from [facility] prior to 09/10/2022.   [facility] Utilities    Threatened with loss of utilities: No  Health Literacy: Not on file     Vital Signs: There were no vitals taken for this visit.  Physical Exam  Imaging:  MR Prostate 02/17/24    The prostate gland measures 6.8 by 8.7 by 5.4 cm (volume = 170 cm^3).  CTA pelvis 04/01/24 Volume = 195 g  Labs:  CBC: Recent Labs    05/07/24 0804  WBC 4.5  HGB 13.3  HCT 38.3*  PLT 250    COAGS: Recent Labs    05/07/24 0804  INR 1.0    BMP: Recent Labs    04/01/24 1311 05/07/24 0804  NA  --  136  K  --  4.1  CL  --  104  CO2  --  22  GLUCOSE  --  132*  BUN  --  11  CALCIUM   --  8.7*  CREATININE 1.20* 0.97  GFRNONAA  --  >60    LIVER FUNCTION TESTS: No results for input(s): BILITOT, AST, ALT, ALKPHOS, PROT, ALBUMIN in the last 8760 hours.  Assessment and Plan: 78 year old male with a history of benign prostatic hyperplasia (195 g) with moderate lower urinary tract symptoms (IPSS-QoL 11-3). He underwent a technically successful bilateral prostate artery embolization 05/07/24. Significantly improved IPSS-QoL, now 3-1. He is very pleased with the results.  Follow up in IR clinic in 3 months.  Ester Sides, MD Pager: [phone]    I spent a total of 25 Minutes in face to face in clinical consultation, greater than 50% of which was counseling/coordinating care for benign prostatic hyperplasia.

## 2024-06-24 ENCOUNTER — Inpatient Hospital Stay: Admission: RE | Admit: 2024-06-24 | Discharge: 2024-06-24 | Attending: Interventional Radiology

## 2024-06-24 DIAGNOSIS — N401 Enlarged prostate with lower urinary tract symptoms: Secondary | ICD-10-CM

## 2024-06-24 HISTORY — PX: IR RADIOLOGIST EVAL & MGMT: IMG5224

## 2024-07-03 ENCOUNTER — Ambulatory Visit

## 2024-07-03 DIAGNOSIS — I4892 Unspecified atrial flutter: Secondary | ICD-10-CM | POA: Diagnosis not present

## 2024-07-03 LAB — CUP PACEART REMOTE DEVICE CHECK
Date Time Interrogation Session: 20251224045413
Implantable Pulse Generator Implant Date: 20230629
Pulse Gen Model: 5000
Pulse Gen Serial Number: 511010278

## 2024-07-05 NOTE — Progress Notes (Signed)
 Remote Loop Recorder Transmission

## 2024-07-09 ENCOUNTER — Ambulatory Visit: Payer: Self-pay | Admitting: Cardiology

## 2024-07-17 ENCOUNTER — Encounter: Payer: Self-pay | Admitting: Dermatology

## 2024-07-17 ENCOUNTER — Other Ambulatory Visit: Payer: Self-pay | Admitting: Dermatology

## 2024-07-17 ENCOUNTER — Ambulatory Visit: Admitting: Dermatology

## 2024-07-17 VITALS — BP 105/70

## 2024-07-17 DIAGNOSIS — L219 Seborrheic dermatitis, unspecified: Secondary | ICD-10-CM | POA: Diagnosis not present

## 2024-07-17 DIAGNOSIS — L814 Other melanin hyperpigmentation: Secondary | ICD-10-CM | POA: Diagnosis not present

## 2024-07-17 DIAGNOSIS — L409 Psoriasis, unspecified: Secondary | ICD-10-CM

## 2024-07-17 DIAGNOSIS — D1801 Hemangioma of skin and subcutaneous tissue: Secondary | ICD-10-CM | POA: Diagnosis not present

## 2024-07-17 DIAGNOSIS — L821 Other seborrheic keratosis: Secondary | ICD-10-CM

## 2024-07-17 DIAGNOSIS — Z1283 Encounter for screening for malignant neoplasm of skin: Secondary | ICD-10-CM

## 2024-07-17 DIAGNOSIS — L570 Actinic keratosis: Secondary | ICD-10-CM | POA: Diagnosis not present

## 2024-07-17 DIAGNOSIS — W908XXA Exposure to other nonionizing radiation, initial encounter: Secondary | ICD-10-CM

## 2024-07-17 DIAGNOSIS — L578 Other skin changes due to chronic exposure to nonionizing radiation: Secondary | ICD-10-CM

## 2024-07-17 DIAGNOSIS — D229 Melanocytic nevi, unspecified: Secondary | ICD-10-CM

## 2024-07-17 MED ORDER — CLOBETASOL PROPIONATE 0.05 % EX OINT: TOPICAL_OINTMENT | CUTANEOUS | 2 refills | Status: DC

## 2024-07-17 NOTE — Progress Notes (Signed)
 "  Follow-Up Visit   Subjective  Keith Phelps is a 79 y.o. male established patient who presents for FOLLOW UP on the diagnoses listed below:  Patient was last evaluated on 06/13/23.   AK: Pt was prescribed 5FU at last OV to treat AKs on face and arms to start using in January of 2025 BID for 14 days. However, pt stated he did not complete Tx due to having a fall, breaking a toe and having to go to PT & OT for quite some time. Pt stated that he also have concerns about side effects of 5FU and stated that he would like to not proceed at this time.   Add On: Upper body exam    The following portions of the chart were reviewed this encounter and updated as appropriate: medications, allergies, medical history  Review of Systems:  No other skin or systemic complaints except as noted in HPI or Assessment and Plan.  Objective  Well appearing patient in no apparent distress; mood and affect are within normal limits.   A focused examination was performed of the following areas: waist up exam    Relevant exam findings are noted in the Assessment and Plan.             Assessment & Plan   ACTINIC KERATOSIS Exam: Erythematous thin papules/macules with gritty scale at the face & arms  Actinic keratoses are precancerous spots that appear secondary to cumulative UV radiation exposure/sun exposure over time. They are chronic with expected duration over 1 year. A portion of actinic keratoses will progress to squamous cell carcinoma of the skin. It is not possible to reliably predict which spots will progress to skin cancer and so treatment is recommended to prevent development of skin cancer.  Recommend daily broad spectrum sunscreen SPF 30+ to sun-exposed areas, reapply every 2 hours as needed.  Recommend staying in the shade or wearing long sleeves, sun glasses (UVA+UVB protection) and wide brim hats (4-inch brim around the entire circumference of the hat). Call for new or changing  lesions.  Treatment Plan: - Encouraged pt to proceed with 5FU Tx applying BID for 14 days.  - Apply hydrocortisone  BID for 2 weeks after 5FU.  - Samples of CeraVe foaming cleanser provided.  - SPF mineral use daily   SEBORRHEIC DERMATITIS Exam: Pink patches with greasy scale at scalp  controlled  Treatment Plan: - Continue using Head & Shoulders shampoo for scalp and beard area PRN    LENTIGINES, SEBORRHEIC KERATOSES, HEMANGIOMAS - Benign normal skin lesions - Benign-appearing - Call for any changes  BENIGN MELANOCYTIC NEVI - Tan-brown and/or pink-flesh-colored symmetric macules and papules - Benign appearing on exam today - Observation - Call clinic for new or changing moles - Recommend daily use of broad spectrum spf 30+ sunscreen to sun-exposed areas.    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 4% BSA.  flared  patient denies joint pain  Psoriasis is a chronic non-curable, but treatable genetic/hereditary disease that may have other systemic features affecting other organ systems such as joints (Psoriatic Arthritis). It is associated with an increased risk of inflammatory bowel disease, heart disease, non-alcoholic fatty liver disease, and depression.  Treatments include light and laser treatments; topical medications; and systemic medications including oral and injectables.  Treatment Plan: - Rx clobetasol  oint - apply BID for 2 weeks on, 2 weeks on. Repeat for flares.    SKIN CANCER SCREENING PERFORMED TODAY PSORIASIS  This Visit - clobetasol  ointment (TEMOVATE ) 0.05 % - apply BID for 2 weeks on, 2 weeks on. Repeat for flares.  Return in 4 months (on 11/14/2024) for AK, PSORIASIS.   Documentation: I have reviewed the above documentation for accuracy and completeness, and I agree with the above.  I, Shirron Maranda, CMA II, am acting as scribe for:  Delon Lenis, DO "

## 2024-07-17 NOTE — Patient Instructions (Addendum)
 " VISIT SUMMARY:  You came in today for a dermatological evaluation and management of your skin conditions. We discussed your history of seborrheic dermatitis, recent flare-ups, and your current treatment regimen. We also reviewed your concerns about using Efudex  cream and your history of dry skin and skin cancer. Additionally, we addressed your recent stroke and its impact on your left side, as well as your past middle toe fracture.  YOUR PLAN:  -PSORIASIS:  Psoriasis is a skin condition that causes red, scaly patches on the skin. It can be triggered by trauma, known as the Koebner phenomenon. You have been prescribed clobetasol  ointment to apply to the affected areas on your back and elbows twice daily for two weeks, followed by a two-week break.  -SEBORRHEIC DERMATITIS:  Seborrheic dermatitis is a common skin condition that causes scaly patches and red skin, mainly on the scalp. Continue using Head and Shoulders shampoo with 1% zinc pyrithione for maintenance.  -EFUDEX  CREAM:  Efudex  cream is a topical chemotherapy used to treat sun-damaged skin cells and reduce the need for future freezing or surgeries for skin cancers.   Apply Efudex  cream (5-FU) twice daily for two weeks, washing it off with soap and water each time. If significant irritation occurs, stop using it early.   After the Efudex  treatment (5-FU), apply hydrocortisone  ointment twice daily for two weeks. Use mineral sunscreen over the cream to prevent irritation.  -XEROSIS CUTIS (DRY SKIN):  Xerosis cutis is a condition of dry skin. You should moisturize daily using a gentle cleanser and moisturizer to improve your skin's hydration.  INSTRUCTIONS:  Please follow up as needed for any concerns or if your symptoms do not improve. Continue with your current therapies and treatments as discussed.      Important Information  Due to recent changes in healthcare laws, you may see results of your pathology and/or laboratory  studies on MyChart before the doctors have had a chance to review them. We understand that in some cases there may be results that are confusing or concerning to you. Please understand that not all results are received at the same time and often the doctors may need to interpret multiple results in order to provide you with the best plan of care or course of treatment. Therefore, we ask that you please give us  2 business days to thoroughly review all your results before contacting the office for clarification. Should we see a critical lab result, you will be contacted sooner.   If You Need Anything After Your Visit  If you have any questions or concerns for your doctor, please call our main line at [phone] If no one answers, please leave a voicemail as directed and we will return your call as soon as possible. Messages left after 4 pm will be answered the following business day.   You may also send us  a message via MyChart. We typically respond to MyChart messages within 1-2 business days.  For prescription refills, please ask your pharmacy to contact our office. Our fax number is [phone].  If you have an urgent issue when the clinic is closed that cannot wait until the next business day, you can page your doctor at the number below.    Please note that while we do our best to be available for urgent issues outside of office hours, we are not available 24/7.   If you have an urgent issue and are unable to reach us , you may choose to seek medical care at your  doctor's office, retail clinic, urgent care center, or emergency room.  If you have a medical emergency, please immediately call 911 or go to the emergency department. In the event of inclement weather, please call our main line at [phone] for an update on the status of any delays or closures.  Dermatology Medication Tips: Please keep the boxes that topical medications come in in order to help keep track of the instructions  about where and how to use these. Pharmacies typically print the medication instructions only on the boxes and not directly on the medication tubes.   If your medication is too expensive, please contact our office at [phone] or send us  a message through MyChart.   We are unable to tell what your co-pay for medications will be in advance as this is different depending on your insurance coverage. However, we may be able to find a substitute medication at lower cost or fill out paperwork to get insurance to cover a needed medication.   If a prior authorization is required to get your medication covered by your insurance company, please allow us  1-2 business days to complete this process.  Drug prices often vary depending on where the prescription is filled and some pharmacies may offer cheaper prices.  The website www.goodrx.com contains coupons for medications through different pharmacies. The prices here do not account for what the cost may be with help from insurance (it may be cheaper with your insurance), but the website can give you the price if you did not use any insurance.  - You can print the associated coupon and take it with your prescription to the pharmacy.  - You may also stop by our office during regular business hours and pick up a GoodRx coupon card.  - If you need your prescription sent electronically to a different pharmacy, notify our office through [facility] or by phone at [phone]     "

## 2024-08-03 ENCOUNTER — Ambulatory Visit: Payer: Self-pay | Attending: Cardiology

## 2024-08-03 DIAGNOSIS — I4892 Unspecified atrial flutter: Secondary | ICD-10-CM | POA: Diagnosis not present

## 2024-08-05 LAB — CUP PACEART REMOTE DEVICE CHECK
Date Time Interrogation Session: 20260124050516
Implantable Pulse Generator Implant Date: 20230629
Pulse Gen Model: 5000
Pulse Gen Serial Number: 511010278

## 2024-08-06 ENCOUNTER — Ambulatory Visit: Payer: Self-pay | Admitting: Cardiology

## 2024-08-08 NOTE — Progress Notes (Signed)
 Remote Loop Recorder Transmission

## 2024-09-03 ENCOUNTER — Ambulatory Visit: Payer: Self-pay

## 2024-10-04 ENCOUNTER — Ambulatory Visit: Payer: Self-pay

## 2024-11-04 ENCOUNTER — Ambulatory Visit: Payer: Self-pay

## 2024-11-18 ENCOUNTER — Ambulatory Visit: Admitting: Dermatology
